# Patient Record
Sex: Female | Born: 1955 | Race: White | Hispanic: No | Marital: Married | Smoking: Never smoker
Health system: Southern US, Community
[De-identification: ages and names within clinical notes are randomized; demographics above are authoritative.]

## PROBLEM LIST (undated history)

## (undated) DIAGNOSIS — K5790 Diverticulosis of intestine, part unspecified, without perforation or abscess without bleeding: Secondary | ICD-10-CM

## (undated) DIAGNOSIS — Z9889 Other specified postprocedural states: Secondary | ICD-10-CM

## (undated) DIAGNOSIS — M810 Age-related osteoporosis without current pathological fracture: Secondary | ICD-10-CM

## (undated) DIAGNOSIS — M549 Dorsalgia, unspecified: Secondary | ICD-10-CM

## (undated) DIAGNOSIS — M2550 Pain in unspecified joint: Secondary | ICD-10-CM

## (undated) DIAGNOSIS — R112 Nausea with vomiting, unspecified: Secondary | ICD-10-CM

## (undated) DIAGNOSIS — G8929 Other chronic pain: Secondary | ICD-10-CM

## (undated) DIAGNOSIS — G4700 Insomnia, unspecified: Secondary | ICD-10-CM

## (undated) DIAGNOSIS — E785 Hyperlipidemia, unspecified: Secondary | ICD-10-CM

## (undated) DIAGNOSIS — T7840XA Allergy, unspecified, initial encounter: Secondary | ICD-10-CM

## (undated) DIAGNOSIS — F32A Depression, unspecified: Secondary | ICD-10-CM

## (undated) DIAGNOSIS — Z87898 Personal history of other specified conditions: Secondary | ICD-10-CM

## (undated) DIAGNOSIS — K8020 Calculus of gallbladder without cholecystitis without obstruction: Secondary | ICD-10-CM

## (undated) DIAGNOSIS — K529 Noninfective gastroenteritis and colitis, unspecified: Secondary | ICD-10-CM

## (undated) DIAGNOSIS — Z860100 Personal history of colon polyps, unspecified: Secondary | ICD-10-CM

## (undated) DIAGNOSIS — H269 Unspecified cataract: Secondary | ICD-10-CM

## (undated) DIAGNOSIS — J45909 Unspecified asthma, uncomplicated: Secondary | ICD-10-CM

## (undated) DIAGNOSIS — M1990 Unspecified osteoarthritis, unspecified site: Secondary | ICD-10-CM

## (undated) DIAGNOSIS — Z8709 Personal history of other diseases of the respiratory system: Secondary | ICD-10-CM

## (undated) DIAGNOSIS — E039 Hypothyroidism, unspecified: Secondary | ICD-10-CM

## (undated) DIAGNOSIS — I10 Essential (primary) hypertension: Secondary | ICD-10-CM

## (undated) DIAGNOSIS — F419 Anxiety disorder, unspecified: Secondary | ICD-10-CM

## (undated) DIAGNOSIS — Z9289 Personal history of other medical treatment: Secondary | ICD-10-CM

## (undated) DIAGNOSIS — K219 Gastro-esophageal reflux disease without esophagitis: Secondary | ICD-10-CM

## (undated) DIAGNOSIS — R0602 Shortness of breath: Secondary | ICD-10-CM

## (undated) DIAGNOSIS — J189 Pneumonia, unspecified organism: Secondary | ICD-10-CM

## (undated) DIAGNOSIS — R531 Weakness: Secondary | ICD-10-CM

## (undated) DIAGNOSIS — F329 Major depressive disorder, single episode, unspecified: Secondary | ICD-10-CM

## (undated) HISTORY — DX: Allergy, unspecified, initial encounter: T78.40XA

## (undated) HISTORY — DX: Unspecified cataract: H26.9

## (undated) HISTORY — PX: OTHER SURGICAL HISTORY: SHX169

## (undated) HISTORY — PX: EYE SURGERY: SHX253

## (undated) HISTORY — PX: POLYPECTOMY: SHX149

## (undated) HISTORY — DX: Age-related osteoporosis without current pathological fracture: M81.0

## (undated) HISTORY — PX: SHOULDER SURGERY: SHX246

## (undated) HISTORY — PX: COLOSTOMY: SHX63

## (undated) HISTORY — PX: CHOLECYSTECTOMY: SHX55

## (undated) HISTORY — PX: COLONOSCOPY: SHX174

---
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
## 1977-05-15 HISTORY — PX: DILATION AND CURETTAGE OF UTERUS: SHX78

## 1981-05-15 HISTORY — PX: OTHER SURGICAL HISTORY: SHX169

## 2008-05-15 HISTORY — PX: JOINT REPLACEMENT: SHX530

## 2013-02-05 ENCOUNTER — Other Ambulatory Visit: Payer: Self-pay | Admitting: Neurosurgery

## 2013-02-05 DIAGNOSIS — M549 Dorsalgia, unspecified: Secondary | ICD-10-CM

## 2013-02-13 ENCOUNTER — Ambulatory Visit
Admission: RE | Admit: 2013-02-13 | Discharge: 2013-02-13 | Disposition: A | Discharge status: Home / Self Care | Payer: Medicare Other | Source: Ambulatory Visit | Attending: Neurosurgery | Admitting: Neurosurgery

## 2013-02-13 VITALS — BP 106/57 | HR 75 | Ht 65.0 in | Wt 240.0 lb

## 2013-02-13 DIAGNOSIS — M549 Dorsalgia, unspecified: Secondary | ICD-10-CM

## 2013-02-13 MED ORDER — DIAZEPAM 5 MG PO TABS
10.0000 mg | ORAL_TABLET | Freq: Once | ORAL | Status: AC
  Administered 2013-02-13: 10 mg via ORAL

## 2013-02-13 MED ORDER — ONDANSETRON HCL 4 MG/2ML IJ SOLN: 4.0000 mg | Freq: Four times a day (QID) | INTRAMUSCULAR | Status: DC | PRN

## 2013-02-13 MED ORDER — IOHEXOL 180 MG/ML  SOLN
15.0000 mL | Freq: Once | INTRAMUSCULAR | Status: AC | PRN
  Administered 2013-02-13: 15 mL via INTRATHECAL

## 2013-02-13 NOTE — Progress Notes (Signed)
Pt states she has been off venlafaxine for the past 2 days. Discharge instructions explained to pt.

## 2013-02-17 ENCOUNTER — Other Ambulatory Visit: Payer: Self-pay | Admitting: Neurosurgery

## 2013-03-05 ENCOUNTER — Encounter (HOSPITAL_COMMUNITY): Payer: Self-pay | Admitting: Pharmacy Technician

## 2013-03-10 ENCOUNTER — Encounter (HOSPITAL_COMMUNITY): Payer: Self-pay

## 2013-03-10 ENCOUNTER — Encounter (HOSPITAL_COMMUNITY)
Admission: RE | Admit: 2013-03-10 | Discharge: 2013-03-10 | Disposition: A | Discharge status: Home / Self Care | Payer: Medicare Other | Source: Ambulatory Visit | Attending: Anesthesiology | Admitting: Anesthesiology

## 2013-03-10 ENCOUNTER — Encounter (HOSPITAL_COMMUNITY)
Admission: RE | Admit: 2013-03-10 | Discharge: 2013-03-10 | Disposition: A | Discharge status: Home / Self Care | Payer: Medicare Other | Source: Ambulatory Visit | Attending: Neurosurgery | Admitting: Neurosurgery

## 2013-03-10 DIAGNOSIS — Z01812 Encounter for preprocedural laboratory examination: Secondary | ICD-10-CM | POA: Insufficient documentation

## 2013-03-10 DIAGNOSIS — Z01810 Encounter for preprocedural cardiovascular examination: Secondary | ICD-10-CM | POA: Insufficient documentation

## 2013-03-10 DIAGNOSIS — Z01818 Encounter for other preprocedural examination: Secondary | ICD-10-CM | POA: Insufficient documentation

## 2013-03-10 HISTORY — DX: Major depressive disorder, single episode, unspecified: F32.9

## 2013-03-10 HISTORY — DX: Other specified postprocedural states: R11.2

## 2013-03-10 HISTORY — DX: Weakness: R53.1

## 2013-03-10 HISTORY — DX: Insomnia, unspecified: G47.00

## 2013-03-10 HISTORY — DX: Diverticulosis of intestine, part unspecified, without perforation or abscess without bleeding: K57.90

## 2013-03-10 HISTORY — DX: Gastro-esophageal reflux disease without esophagitis: K21.9

## 2013-03-10 HISTORY — DX: Personal history of other diseases of the respiratory system: Z87.09

## 2013-03-10 HISTORY — DX: Hypothyroidism, unspecified: E03.9

## 2013-03-10 HISTORY — DX: Anxiety disorder, unspecified: F41.9

## 2013-03-10 HISTORY — DX: Essential (primary) hypertension: I10

## 2013-03-10 HISTORY — DX: Personal history of colon polyps, unspecified: Z86.0100

## 2013-03-10 HISTORY — DX: Personal history of colonic polyps: Z86.010

## 2013-03-10 HISTORY — DX: Pneumonia, unspecified organism: J18.9

## 2013-03-10 HISTORY — DX: Calculus of gallbladder without cholecystitis without obstruction: K80.20

## 2013-03-10 HISTORY — DX: Other chronic pain: G89.29

## 2013-03-10 HISTORY — DX: Shortness of breath: R06.02

## 2013-03-10 HISTORY — DX: Unspecified osteoarthritis, unspecified site: M19.90

## 2013-03-10 HISTORY — DX: Personal history of other specified conditions: Z87.898

## 2013-03-10 HISTORY — DX: Depression, unspecified: F32.A

## 2013-03-10 HISTORY — DX: Pain in unspecified joint: M25.50

## 2013-03-10 HISTORY — DX: Hyperlipidemia, unspecified: E78.5

## 2013-03-10 HISTORY — DX: Personal history of other medical treatment: Z92.89

## 2013-03-10 HISTORY — DX: Dorsalgia, unspecified: M54.9

## 2013-03-10 HISTORY — DX: Other specified postprocedural states: Z98.890

## 2013-03-10 HISTORY — DX: Unspecified asthma, uncomplicated: J45.909

## 2013-03-10 LAB — BASIC METABOLIC PANEL
BUN: 17 mg/dL (ref 6–23)
CO2: 31 mEq/L (ref 19–32)
Calcium: 9.5 mg/dL (ref 8.4–10.5)
Chloride: 97 mEq/L (ref 96–112)
Glucose, Bld: 93 mg/dL (ref 70–99)
Potassium: 3.5 mEq/L (ref 3.5–5.1)
Sodium: 136 mEq/L (ref 135–145)

## 2013-03-10 LAB — CBC
Hemoglobin: 15.1 g/dL — ABNORMAL HIGH (ref 12.0–15.0)
MCH: 31.5 pg (ref 26.0–34.0)
MCV: 90.8 fL (ref 78.0–100.0)
RBC: 4.8 MIL/uL (ref 3.87–5.11)

## 2013-03-10 NOTE — Pre-Procedure Instructions (Signed)
Whitney Higgins  03/10/2013   Your procedure is scheduled on:  Tues, Nov 4 @ 10:00 AM  Report to [facility] Stay [facility]trance A at 8:00 AM.  Call this number if you have problems the morning of surgery: [phone]   Remember:   Do not eat food or drink liquids after midnight.   Take these medicines the morning of surgery with A SIP OF WATER: Albuterol<Bring Your Inhaler With You>,Alprazolam(Xanax),Amlodipine(Norvasc),Zyrtec(Cetirizine),Synthroid(Levothyroxine),Singulair(Montelukast),Omeprazole(Prilosec),Cytomel(Liothyronine), and Effexor(Venlafaxine)             No Goody's,BC's,Aspirin,Ibuprofen,Fish Oil,or any Herbal Medications   Do not wear jewelry, make-up or nail polish.  Do not wear lotions, powders, or perfumes. You may wear deodorant.  Do not shave 48 hours prior to surgery.   Do not bring valuables to the hospital.  [facility] is not responsible                  for any belongings or valuables.               Contacts, dentures or bridgework may not be worn into surgery.  Leave suitcase in the car. After surgery it may be brought to your room.  For patients admitted to the hospital, discharge time is determined by your                treatment team.               Patients discharged the day of surgery will not be allowed to drive  home.    Special Instructions: Shower using CHG 2 nights before surgery and the night before surgery.  If you shower the day of surgery use CHG.  Use special wash - you have one bottle of CHG for all showers.  You should use approximately 1/3 of the bottle for each shower.   Please read over the following fact sheets that you were given: Pain Booklet, Coughing and Deep Breathing, MRSA Information and Surgical Site Infection Prevention

## 2013-03-10 NOTE — Progress Notes (Addendum)
Pt doesn't have a cardiologist  Stress test done >105yrs ago  Denies ever having an echo or heart cath  Medical Md is with [facility] in [place] Peyton Najjar  Denies having an ekg or cxr in past yr

## 2013-03-11 NOTE — Progress Notes (Signed)
Anesthesia Chart Review:  Patient is a 57 year old female scheduled for left L4-5, L5-S1 microdiskectomy on 03/18/13 by Dr. Gerlene Fee.  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 PCP is Daphane Shepherd, PA-C with [facility]. She is aware of plans for back surgery.  EKG on 03/10/13 showed NSR, minimal voltage criteria for LVH, inferior infarct (age undetermined).  There are no prior EKG tracing within [facility] or at her PCP office.  She reported a stress test > 5 years ago, but denied prior echo or cardiac cath. Prior testing was done in [facility].    I called and spoke with patient.  She reported prior stress test (treadmill) was done more for SOB to rule out cardiac etiology.  She has allergy and exercise induced asthma and was treated for bronchitis earlier this month.  She feels back at her baseline.  She denies SOB at rest or significant DOE with her day to day activities.  She denies chest pain and edema. Her activity has been fairly limited since early summer due to back pain.  She received an epidural injection about one month ago and has been able to do her own shopping again and was able to take her grandson to the park recently.  She has had no CV symptoms with these activities.    CXR on 03/10/13 showed no acute cardiopulmonary disease.  Preoperative labs noted.  I reviewed above including EKG with anesthesiologist Dr. Jacklynn Bue yesterday.  I was able to confirm with patient that she is not having any CV symptoms.  If no acute changes then it is anticipated that she can proceed as planned.  Velna Ochs [facility] Short Stay  Center/Anesthesiology Phone [phone] 03/11/2013 5:03 PM

## 2013-03-17 MED ORDER — VANCOMYCIN HCL 10 G IV SOLR
1500.0000 mg | INTRAVENOUS | Status: AC
  Administered 2013-03-18: 1500 mg via INTRAVENOUS
  Filled 2013-03-17: qty 1500

## 2013-03-18 ENCOUNTER — Encounter (HOSPITAL_COMMUNITY): Payer: Medicare Other | Admitting: Vascular Surgery

## 2013-03-18 ENCOUNTER — Inpatient Hospital Stay (HOSPITAL_COMMUNITY): Payer: Medicare Other

## 2013-03-18 ENCOUNTER — Inpatient Hospital Stay (HOSPITAL_COMMUNITY)
Admission: RE | Admit: 2013-03-18 | Discharge: 2013-03-19 | DRG: 520 | Disposition: A | Discharge status: Home / Self Care | Payer: Medicare Other | Source: Ambulatory Visit | Attending: Neurosurgery | Admitting: Neurosurgery

## 2013-03-18 ENCOUNTER — Encounter (HOSPITAL_COMMUNITY)
Admission: RE | Disposition: A | Discharge status: Home / Self Care | Payer: Self-pay | Source: Ambulatory Visit | Attending: Neurosurgery

## 2013-03-18 ENCOUNTER — Inpatient Hospital Stay (HOSPITAL_COMMUNITY): Payer: Medicare Other | Admitting: Certified Registered Nurse Anesthetist

## 2013-03-18 ENCOUNTER — Encounter (HOSPITAL_COMMUNITY): Payer: Self-pay | Admitting: *Deleted

## 2013-03-18 DIAGNOSIS — M5126 Other intervertebral disc displacement, lumbar region: Principal | ICD-10-CM | POA: Diagnosis present

## 2013-03-18 DIAGNOSIS — G8929 Other chronic pain: Secondary | ICD-10-CM | POA: Diagnosis present

## 2013-03-18 DIAGNOSIS — G4700 Insomnia, unspecified: Secondary | ICD-10-CM | POA: Diagnosis present

## 2013-03-18 DIAGNOSIS — Z860100 Personal history of colon polyps, unspecified: Secondary | ICD-10-CM

## 2013-03-18 DIAGNOSIS — F3289 Other specified depressive episodes: Secondary | ICD-10-CM | POA: Diagnosis present

## 2013-03-18 DIAGNOSIS — E785 Hyperlipidemia, unspecified: Secondary | ICD-10-CM | POA: Diagnosis present

## 2013-03-18 DIAGNOSIS — F329 Major depressive disorder, single episode, unspecified: Secondary | ICD-10-CM | POA: Diagnosis present

## 2013-03-18 DIAGNOSIS — Z79899 Other long term (current) drug therapy: Secondary | ICD-10-CM

## 2013-03-18 DIAGNOSIS — J45909 Unspecified asthma, uncomplicated: Secondary | ICD-10-CM | POA: Diagnosis present

## 2013-03-18 DIAGNOSIS — I10 Essential (primary) hypertension: Secondary | ICD-10-CM | POA: Diagnosis present

## 2013-03-18 DIAGNOSIS — F411 Generalized anxiety disorder: Secondary | ICD-10-CM | POA: Diagnosis present

## 2013-03-18 DIAGNOSIS — R5381 Other malaise: Secondary | ICD-10-CM | POA: Diagnosis present

## 2013-03-18 DIAGNOSIS — K219 Gastro-esophageal reflux disease without esophagitis: Secondary | ICD-10-CM | POA: Diagnosis present

## 2013-03-18 DIAGNOSIS — Z880 Allergy status to penicillin: Secondary | ICD-10-CM

## 2013-03-18 DIAGNOSIS — E039 Hypothyroidism, unspecified: Secondary | ICD-10-CM | POA: Diagnosis present

## 2013-03-18 DIAGNOSIS — Z9089 Acquired absence of other organs: Secondary | ICD-10-CM

## 2013-03-18 DIAGNOSIS — Z23 Encounter for immunization: Secondary | ICD-10-CM

## 2013-03-18 HISTORY — PX: LUMBAR LAMINECTOMY/DECOMPRESSION MICRODISCECTOMY: SHX5026

## 2013-03-18 SURGERY — LUMBAR LAMINECTOMY/DECOMPRESSION MICRODISCECTOMY 2 LEVELS
Anesthesia: General | Site: Spine Lumbar | Laterality: Left | Wound class: Clean

## 2013-03-18 MED ORDER — SODIUM CHLORIDE 0.9 % IJ SOLN: 3.0000 mL | INTRAMUSCULAR | Status: DC | PRN

## 2013-03-18 MED ORDER — LACTATED RINGERS IV SOLN
INTRAVENOUS | Status: DC
  Administered 2013-03-18: 09:00:00 via INTRAVENOUS

## 2013-03-18 MED ORDER — HYDROCODONE-ACETAMINOPHEN 5-325 MG PO TABS
1.0000 | ORAL_TABLET | ORAL | Status: DC | PRN
  Filled 2013-03-18: qty 2

## 2013-03-18 MED ORDER — PROPOFOL 10 MG/ML IV BOLUS
INTRAVENOUS | Status: DC | PRN
  Administered 2013-03-18: 200 mg via INTRAVENOUS

## 2013-03-18 MED ORDER — ONDANSETRON HCL 4 MG/2ML IJ SOLN: 4.0000 mg | INTRAMUSCULAR | Status: DC | PRN

## 2013-03-18 MED ORDER — ONDANSETRON HCL 4 MG/2ML IJ SOLN
INTRAMUSCULAR | Status: DC | PRN
  Administered 2013-03-18: 4 mg via INTRAVENOUS

## 2013-03-18 MED ORDER — THROMBIN 5000 UNITS EX SOLR
CUTANEOUS | Status: DC | PRN
  Administered 2013-03-18 (×2): 5000 [IU] via TOPICAL

## 2013-03-18 MED ORDER — HYDROMORPHONE HCL PF 1 MG/ML IJ SOLN
1.0000 mg | INTRAMUSCULAR | Status: DC | PRN
  Administered 2013-03-18 – 2013-03-19 (×3): 1 mg via INTRAMUSCULAR
  Filled 2013-03-18 (×3): qty 1

## 2013-03-18 MED ORDER — INFLUENZA VAC SPLIT QUAD 0.5 ML IM SUSP
0.5000 mL | INTRAMUSCULAR | Status: AC
  Administered 2013-03-19: 0.5 mL via INTRAMUSCULAR
  Filled 2013-03-18: qty 0.5

## 2013-03-18 MED ORDER — LISINOPRIL 20 MG PO TABS
20.0000 mg | ORAL_TABLET | Freq: Every day | ORAL | Status: DC
  Filled 2013-03-18: qty 1

## 2013-03-18 MED ORDER — DEXAMETHASONE SODIUM PHOSPHATE 10 MG/ML IJ SOLN
10.0000 mg | INTRAMUSCULAR | Status: AC
  Administered 2013-03-18: 10 mg via INTRAVENOUS

## 2013-03-18 MED ORDER — SODIUM CHLORIDE 0.9 % IV SOLN
INTRAVENOUS | Status: DC | PRN
  Administered 2013-03-18: 11:00:00 via INTRAVENOUS

## 2013-03-18 MED ORDER — ACETAMINOPHEN 650 MG RE SUPP: 650.0000 mg | RECTAL | Status: DC | PRN

## 2013-03-18 MED ORDER — LISINOPRIL-HYDROCHLOROTHIAZIDE 20-25 MG PO TABS: 1.0000 | ORAL_TABLET | Freq: Every day | ORAL | Status: DC

## 2013-03-18 MED ORDER — MIDAZOLAM HCL 5 MG/5ML IJ SOLN
INTRAMUSCULAR | Status: DC | PRN
  Administered 2013-03-18 (×2): 2 mg via INTRAVENOUS

## 2013-03-18 MED ORDER — BUPIVACAINE HCL (PF) 0.5 % IJ SOLN
INTRAMUSCULAR | Status: DC | PRN
  Administered 2013-03-18: 20 mL

## 2013-03-18 MED ORDER — DEXAMETHASONE SODIUM PHOSPHATE 4 MG/ML IJ SOLN: 4.0000 mg | Freq: Four times a day (QID) | INTRAMUSCULAR | Status: AC

## 2013-03-18 MED ORDER — VENLAFAXINE HCL ER 150 MG PO CP24
150.0000 mg | ORAL_CAPSULE | Freq: Two times a day (BID) | ORAL | Status: DC
  Administered 2013-03-18: 150 mg via ORAL
  Filled 2013-03-18 (×3): qty 1

## 2013-03-18 MED ORDER — ALPRAZOLAM 0.25 MG PO TABS
0.2500 mg | ORAL_TABLET | Freq: Every evening | ORAL | Status: DC | PRN
  Administered 2013-03-18: 0.25 mg via ORAL
  Filled 2013-03-18: qty 1

## 2013-03-18 MED ORDER — SIMVASTATIN 5 MG PO TABS
5.0000 mg | ORAL_TABLET | Freq: Every day | ORAL | Status: DC
  Administered 2013-03-18: 5 mg via ORAL
  Filled 2013-03-18 (×2): qty 1

## 2013-03-18 MED ORDER — OXYCODONE HCL 5 MG PO TABS: 5.0000 mg | ORAL_TABLET | Freq: Once | ORAL | Status: DC | PRN

## 2013-03-18 MED ORDER — HEMOSTATIC AGENTS (NO CHARGE) OPTIME
TOPICAL | Status: DC | PRN
  Administered 2013-03-18: 1 via TOPICAL

## 2013-03-18 MED ORDER — ONDANSETRON HCL 4 MG/2ML IJ SOLN
4.0000 mg | Freq: Once | INTRAMUSCULAR | Status: AC | PRN
  Administered 2013-03-18: 4 mg via INTRAVENOUS

## 2013-03-18 MED ORDER — SODIUM CHLORIDE 0.9 % IR SOLN
Status: DC | PRN
  Administered 2013-03-18: 11:00:00

## 2013-03-18 MED ORDER — FENTANYL CITRATE 0.05 MG/ML IJ SOLN
INTRAMUSCULAR | Status: DC | PRN
  Administered 2013-03-18: 150 ug via INTRAVENOUS

## 2013-03-18 MED ORDER — LIDOCAINE HCL (CARDIAC) 20 MG/ML IV SOLN
INTRAVENOUS | Status: DC | PRN
  Administered 2013-03-18: 80 mg via INTRAVENOUS

## 2013-03-18 MED ORDER — PANTOPRAZOLE SODIUM 40 MG IV SOLR
40.0000 mg | Freq: Every day | INTRAVENOUS | Status: DC
  Administered 2013-03-18: 40 mg via INTRAVENOUS
  Filled 2013-03-18 (×2): qty 40

## 2013-03-18 MED ORDER — SODIUM CHLORIDE 0.9 % IJ SOLN
3.0000 mL | Freq: Two times a day (BID) | INTRAMUSCULAR | Status: DC
  Administered 2013-03-18: 3 mL via INTRAVENOUS

## 2013-03-18 MED ORDER — SCOPOLAMINE 1 MG/3DAYS TD PT72
MEDICATED_PATCH | TRANSDERMAL | Status: AC
  Filled 2013-03-18: qty 1

## 2013-03-18 MED ORDER — ZOLPIDEM TARTRATE 5 MG PO TABS
5.0000 mg | ORAL_TABLET | Freq: Every evening | ORAL | Status: DC | PRN
Start: 2013-03-18 — End: 2013-03-19
  Filled 2013-03-18: qty 1

## 2013-03-18 MED ORDER — PHENYLEPHRINE HCL 10 MG/ML IJ SOLN
INTRAMUSCULAR | Status: DC | PRN
  Administered 2013-03-18 (×2): 80 ug via INTRAVENOUS

## 2013-03-18 MED ORDER — DEXAMETHASONE SODIUM PHOSPHATE 10 MG/ML IJ SOLN
INTRAMUSCULAR | Status: AC
  Filled 2013-03-18: qty 1

## 2013-03-18 MED ORDER — KETOROLAC TROMETHAMINE 30 MG/ML IJ SOLN
30.0000 mg | Freq: Four times a day (QID) | INTRAMUSCULAR | Status: DC
  Administered 2013-03-18 – 2013-03-19 (×3): 30 mg via INTRAVENOUS
  Filled 2013-03-18 (×7): qty 1

## 2013-03-18 MED ORDER — DEXAMETHASONE 4 MG PO TABS
4.0000 mg | ORAL_TABLET | Freq: Four times a day (QID) | ORAL | Status: AC
  Administered 2013-03-18 (×2): 4 mg via ORAL
  Filled 2013-03-18 (×2): qty 1

## 2013-03-18 MED ORDER — LACTATED RINGERS IV SOLN
INTRAVENOUS | Status: DC | PRN
  Administered 2013-03-18 (×2): via INTRAVENOUS

## 2013-03-18 MED ORDER — LEVOTHYROXINE SODIUM 88 MCG PO TABS
44.0000 ug | ORAL_TABLET | Freq: Every day | ORAL | Status: DC
  Administered 2013-03-19: 44 ug via ORAL
  Filled 2013-03-18 (×3): qty 0.5

## 2013-03-18 MED ORDER — ACETAMINOPHEN 325 MG PO TABS: 650.0000 mg | ORAL_TABLET | ORAL | Status: DC | PRN

## 2013-03-18 MED ORDER — NEOSTIGMINE METHYLSULFATE 1 MG/ML IJ SOLN
INTRAMUSCULAR | Status: DC | PRN
  Administered 2013-03-18: 4 mg via INTRAVENOUS

## 2013-03-18 MED ORDER — ONDANSETRON HCL 4 MG/2ML IJ SOLN
INTRAMUSCULAR | Status: AC
  Filled 2013-03-18: qty 2

## 2013-03-18 MED ORDER — SCOPOLAMINE 1 MG/3DAYS TD PT72
1.0000 | MEDICATED_PATCH | TRANSDERMAL | Status: DC
  Filled 2013-03-18: qty 1

## 2013-03-18 MED ORDER — OXYCODONE HCL 5 MG/5ML PO SOLN: 5.0000 mg | Freq: Once | ORAL | Status: DC | PRN

## 2013-03-18 MED ORDER — CYCLOBENZAPRINE HCL 10 MG PO TABS: 10.0000 mg | ORAL_TABLET | Freq: Three times a day (TID) | ORAL | Status: DC | PRN

## 2013-03-18 MED ORDER — GLYCOPYRROLATE 0.2 MG/ML IJ SOLN
INTRAMUSCULAR | Status: DC | PRN
  Administered 2013-03-18: .8 mg via INTRAVENOUS

## 2013-03-18 MED ORDER — MONTELUKAST SODIUM 10 MG PO TABS
10.0000 mg | ORAL_TABLET | Freq: Every day | ORAL | Status: DC
  Administered 2013-03-18: 10 mg via ORAL
  Filled 2013-03-18 (×2): qty 1

## 2013-03-18 MED ORDER — VANCOMYCIN HCL IN DEXTROSE 1-5 GM/200ML-% IV SOLN
1000.0000 mg | Freq: Once | INTRAVENOUS | Status: AC
  Administered 2013-03-18: 1000 mg via INTRAVENOUS
  Filled 2013-03-18: qty 200

## 2013-03-18 MED ORDER — ROCURONIUM BROMIDE 100 MG/10ML IV SOLN
INTRAVENOUS | Status: DC | PRN
  Administered 2013-03-18: 50 mg via INTRAVENOUS
  Administered 2013-03-18 (×2): 10 mg via INTRAVENOUS

## 2013-03-18 MED ORDER — SODIUM CHLORIDE 0.9 % IV SOLN: 250.0000 mL | INTRAVENOUS | Status: DC

## 2013-03-18 MED ORDER — PHENOL 1.4 % MT LIQD: 1.0000 | OROMUCOSAL | Status: DC | PRN

## 2013-03-18 MED ORDER — HYDROMORPHONE HCL PF 1 MG/ML IJ SOLN
INTRAMUSCULAR | Status: AC
  Filled 2013-03-18: qty 1

## 2013-03-18 MED ORDER — KCL IN DEXTROSE-NACL 20-5-0.45 MEQ/L-%-% IV SOLN
80.0000 mL/h | INTRAVENOUS | Status: DC
  Filled 2013-03-18 (×3): qty 1000

## 2013-03-18 MED ORDER — LIOTHYRONINE SODIUM 25 MCG PO TABS
25.0000 ug | ORAL_TABLET | Freq: Every day | ORAL | Status: DC
  Administered 2013-03-19: 25 ug via ORAL
  Filled 2013-03-18 (×3): qty 1

## 2013-03-18 MED ORDER — MENTHOL 3 MG MT LOZG: 1.0000 | LOZENGE | OROMUCOSAL | Status: DC | PRN

## 2013-03-18 MED ORDER — 0.9 % SODIUM CHLORIDE (POUR BTL) OPTIME
TOPICAL | Status: DC | PRN
  Administered 2013-03-18: 1000 mL

## 2013-03-18 MED ORDER — AMLODIPINE BESYLATE 5 MG PO TABS
5.0000 mg | ORAL_TABLET | Freq: Every day | ORAL | Status: DC
  Filled 2013-03-18: qty 1

## 2013-03-18 MED ORDER — ALBUTEROL SULFATE HFA 108 (90 BASE) MCG/ACT IN AERS
2.0000 | INHALATION_SPRAY | Freq: Four times a day (QID) | RESPIRATORY_TRACT | Status: DC | PRN
  Filled 2013-03-18: qty 6.7

## 2013-03-18 MED ORDER — EPHEDRINE SULFATE 50 MG/ML IJ SOLN
INTRAMUSCULAR | Status: DC | PRN
  Administered 2013-03-18: 5 mg via INTRAVENOUS
  Administered 2013-03-18 (×3): 15 mg via INTRAVENOUS

## 2013-03-18 MED ORDER — HYDROCHLOROTHIAZIDE 25 MG PO TABS
25.0000 mg | ORAL_TABLET | Freq: Every day | ORAL | Status: DC
  Filled 2013-03-18: qty 1

## 2013-03-18 MED ORDER — SODIUM CHLORIDE 0.9 % IV SOLN
10.0000 mg | INTRAVENOUS | Status: DC | PRN
  Administered 2013-03-18: 40 ug/min via INTRAVENOUS

## 2013-03-18 MED ORDER — HYDROMORPHONE HCL PF 1 MG/ML IJ SOLN
0.2500 mg | INTRAMUSCULAR | Status: DC | PRN
  Administered 2013-03-18: 0.5 mg via INTRAVENOUS

## 2013-03-18 MED ORDER — SCOPOLAMINE 1 MG/3DAYS TD PT72
MEDICATED_PATCH | TRANSDERMAL | Status: DC | PRN
  Administered 2013-03-18: 1 via TRANSDERMAL

## 2013-03-18 SURGICAL SUPPLY — 52 items
BAG DECANTER FOR FLEXI CONT (MISCELLANEOUS) ×2 IMPLANT
BENZOIN TINCTURE PRP APPL 2/3 (GAUZE/BANDAGES/DRESSINGS) ×4 IMPLANT
BLADE SURG ROTATE 9660 (MISCELLANEOUS) ×2 IMPLANT
BRUSH SCRUB EZ PLAIN DRY (MISCELLANEOUS) ×2 IMPLANT
BUR CUTTER 7.0 ROUND (BURR) ×2 IMPLANT
BUR MATCHSTICK NEURO 3.0 LAGG (BURR) ×2 IMPLANT
CANISTER SUCT 3000ML (MISCELLANEOUS) ×2 IMPLANT
CONT SPEC 4OZ CLIKSEAL STRL BL (MISCELLANEOUS) ×2 IMPLANT
DERMABOND ADHESIVE PROPEN (GAUZE/BANDAGES/DRESSINGS) ×1
DERMABOND ADVANCED (GAUZE/BANDAGES/DRESSINGS)
DERMABOND ADVANCED .7 DNX12 (GAUZE/BANDAGES/DRESSINGS) IMPLANT
DERMABOND ADVANCED .7 DNX6 (GAUZE/BANDAGES/DRESSINGS) ×1 IMPLANT
DRAPE LAPAROTOMY 100X72X124 (DRAPES) ×2 IMPLANT
DRAPE MICROSCOPE ZEISS OPMI (DRAPES) ×2 IMPLANT
DRAPE SURG 17X23 STRL (DRAPES) ×4 IMPLANT
DRESSING TELFA 8X3 (GAUZE/BANDAGES/DRESSINGS) IMPLANT
DRSG OPSITE POSTOP 4X6 (GAUZE/BANDAGES/DRESSINGS) ×2 IMPLANT
ELECT BLADE 4.0 EZ CLEAN MEGAD (MISCELLANEOUS) ×2
ELECT REM PT RETURN 9FT ADLT (ELECTROSURGICAL) ×2
ELECTRODE BLDE 4.0 EZ CLN MEGD (MISCELLANEOUS) ×1 IMPLANT
ELECTRODE REM PT RTRN 9FT ADLT (ELECTROSURGICAL) ×1 IMPLANT
GAUZE SPONGE 4X4 16PLY XRAY LF (GAUZE/BANDAGES/DRESSINGS) ×2 IMPLANT
GLOVE BIOGEL PI IND STRL 7.0 (GLOVE) ×4 IMPLANT
GLOVE BIOGEL PI IND STRL 7.5 (GLOVE) ×2 IMPLANT
GLOVE BIOGEL PI INDICATOR 7.0 (GLOVE) ×4
GLOVE BIOGEL PI INDICATOR 7.5 (GLOVE) ×2
GLOVE ECLIPSE 7.0 STRL STRAW (GLOVE) ×2 IMPLANT
GLOVE ECLIPSE 8.0 STRL XLNG CF (GLOVE) ×2 IMPLANT
GLOVE OPTIFIT SS 6.5 STRL BRWN (GLOVE) ×8 IMPLANT
GLOVE SS BIOGEL STRL SZ 7 (GLOVE) ×2 IMPLANT
GLOVE SUPERSENSE BIOGEL SZ 7 (GLOVE) ×2
GOWN BRE IMP SLV AUR LG STRL (GOWN DISPOSABLE) ×4 IMPLANT
GOWN BRE IMP SLV AUR XL STRL (GOWN DISPOSABLE) ×4 IMPLANT
GOWN STRL REIN 2XL LVL4 (GOWN DISPOSABLE) IMPLANT
KIT BASIN OR (CUSTOM PROCEDURE TRAY) ×2 IMPLANT
KIT ROOM TURNOVER OR (KITS) ×2 IMPLANT
NEEDLE HYPO 22GX1.5 SAFETY (NEEDLE) ×2 IMPLANT
NEEDLE SPNL 22GX3.5 QUINCKE BK (NEEDLE) ×4 IMPLANT
NS IRRIG 1000ML POUR BTL (IV SOLUTION) ×2 IMPLANT
PACK LAMINECTOMY NEURO (CUSTOM PROCEDURE TRAY) ×2 IMPLANT
PAD ARMBOARD 7.5X6 YLW CONV (MISCELLANEOUS) ×10 IMPLANT
PATTIES SURGICAL .75X.75 (GAUZE/BANDAGES/DRESSINGS) ×2 IMPLANT
RUBBERBAND STERILE (MISCELLANEOUS) ×4 IMPLANT
SPONGE GAUZE 4X4 12PLY (GAUZE/BANDAGES/DRESSINGS) IMPLANT
SPONGE SURGIFOAM ABS GEL SZ50 (HEMOSTASIS) ×2 IMPLANT
STRIP CLOSURE SKIN 1/2X4 (GAUZE/BANDAGES/DRESSINGS) ×2 IMPLANT
SUT VIC AB 2-0 OS6 18 (SUTURE) ×8 IMPLANT
SUT VIC AB 3-0 CP2 18 (SUTURE) ×2 IMPLANT
SYR 20ML ECCENTRIC (SYRINGE) ×2 IMPLANT
TOWEL OR 17X24 6PK STRL BLUE (TOWEL DISPOSABLE) ×2 IMPLANT
TOWEL OR 17X26 10 PK STRL BLUE (TOWEL DISPOSABLE) ×2 IMPLANT
WATER STERILE IRR 1000ML POUR (IV SOLUTION) ×2 IMPLANT

## 2013-03-18 NOTE — H&P (Signed)
Whitney Higgins is an 57 y.o. female.   Chief Complaint: Left leg pain HPI: The patient is a 57 year old female who was evaluated in the office for left leg pain into the foot a 4 months duration. There is no inciting event. She saw orthopedist shortly after onset was tried on injections and herniated hip which gave her no relief. She doesn't oral prednisone to give her no help. She doesn't Neurontin which gave a mildly Foley but nothing sustained. MR scan was done. Gentle epidural shots which gave her no sustained relief. She is markedly debilitated by her problem after coming to the office for evaluation the options were discussed. General myelography which showed significant abnormalities L4-5 and L5-S1 on the left, symptomatic side. After discussing the options she requested surgery now comes for a two-level left microdiscectomy. I had a long discussion with her regarding the risks and benefits of surgical intervention. The risks discussed include but are not limited to bleeding infection weakness numbness paralysis spinal fluid leak coma and death. We have discussed alternative therapy although risks and benefits of nonintervention. She's had the opportunity to rest numerous questions and appears to understand. With this information in hand she has requested we proceed with surgery.  Past Medical History  Diagnosis Date  . Anxiety     takes Xanax daily  . Depression     takes Effexor daily  . PONV (postoperative nausea and vomiting)   . Hypertension     takes Amlodipine and Lisinopril daily  . Hyperlipidemia     takes Pravastatin daily  . Asthma     inhaler prn and Singulair daily;OTC Zyrtec daily  . Pneumonia     hx of;last time about 4yrs ago  . History of bronchitis     last time less than a yr ago  . Shortness of breath     with exertion  . H/O: pituitary tumor   . Weakness     left leg  . Arthritis   . Joint pain   . Chronic back pain     stenosis  . GERD (gastroesophageal  reflux disease)     takes Omeprazole daily  . History of colon polyps   . Diverticulosis   . History of blood transfusion     no abnormal reaction noted  . Hypothyroidism     takes synthroid and cytomel daily  . Insomnia     takes Xanax nightly  . Gallstones     Past Surgical History  Procedure Laterality Date  . Partial removal of right kidney  1971  . Epidural injections    . Appendectomy  1974  . Dilation and curettage of uterus  1979  . Cesarean section  1981  . Pituitary tumor removed  1983  . Joint replacement  2010    right hip  . Colonoscopy    . Eye surgery      at age 4;vision correction    History reviewed. No pertinent family history. Social History:  reports that she has never smoked. She has never used smokeless tobacco. She reports that she drinks alcohol. She reports that she does not use illicit drugs.  Allergies:  Allergies  Allergen Reactions  . Codeine Nausea And Vomiting  . Penicillins Rash    Medications Prior to Admission  Medication Sig Dispense Refill  . acetaminophen (TYLENOL) 500 MG tablet Take 1,000 mg by mouth every 6 (six) hours as needed for pain.      Marland Kitchen albuterol (PROVENTIL HFA;VENTOLIN HFA) 108 (  90 BASE) MCG/ACT inhaler Inhale 2 puffs into the lungs every 6 (six) hours as needed for wheezing.      Marland Kitchen ALPRAZolam (XANAX) 0.5 MG tablet Take 0.25 mg by mouth at bedtime as needed for sleep.      Marland Kitchen amLODipine (NORVASC) 5 MG tablet Take 5 mg by mouth daily.      . cetirizine (ZYRTEC) 10 MG tablet Take 10 mg by mouth daily.      Marland Kitchen levothyroxine (SYNTHROID, LEVOTHROID) 88 MCG tablet Take 44 mcg by mouth daily before breakfast.      . liothyronine (CYTOMEL) 25 MCG tablet Take 25 mcg by mouth daily.      Marland Kitchen lisinopril-hydrochlorothiazide (PRINZIDE,ZESTORETIC) 20-25 MG per tablet Take 1 tablet by mouth daily.      . montelukast (SINGULAIR) 10 MG tablet Take 10 mg by mouth daily.      Marland Kitchen omeprazole (PRILOSEC) 20 MG capsule Take 20 mg by mouth daily  as needed (for reflux).      . pravastatin (PRAVACHOL) 40 MG tablet Take 40 mg by mouth daily.      Marland Kitchen venlafaxine XR (EFFEXOR-XR) 150 MG 24 hr capsule Take 150 mg by mouth 2 (two) times daily.        No results found for this or any previous visit (from the past 48 hour(s)). No results found.  Positive for back and leg pain as well as some asthma and occasional shortness of breath.  Blood pressure 139/74, pulse 84, temperature 98.6 F (37 C), temperature source Oral, resp. rate 18, SpO2 95.00%.  The patient is awake alert and oriented. She has a decreased reflexes bilaterally. Her strength is 5 over 5 and sensation is intact Assessment/Plan Impression is that of disc abnormalities at L4-5 and L5-S1 the left with lumbar radiculopathy. The plan is for a two-level Left sided microdiscectomy.  Reinaldo Meeker, MD 03/18/2013, 10:40 AM

## 2013-03-18 NOTE — Op Note (Signed)
Preop diagnosis: Herniated disc L4-5 L5-S1 left Postop diagnosis: Same Procedure: Left L4-5 L5-S1 intralaminar laminotomy for excision of herniated disc with the operative microscope Surgeon: Tima Curet Assistant: Conchita Paris  After being placed the prone position the patient's back was prepped and draped in usual sterile fashion. Localizing x-ray was taken prior to incision to identify the appropriate levels. Midline incision was made above the spinous processes of L4-L5 and S1. Using Bovie cutting current the incision was carried the spinous processes. Subperiosteal dissection was then carried out on the left side the spinous processes and lamina and self-retaining tract was placed for exposure. X-ray showed approach the appropriate levels. Starting L4-5 generous laminotomy was performed removing the inferior one half of the L4 lamina the medial third of the facet joint the superior one third of the L5 lamina. Residual bone and ligamentum flavum removed in a piecemeal fashion. Summary compression was then carried out at L5-S1. Once again the inferior one half of the L5 lamina the medial third of the facet joint the superior one third of the S1 segment removed. Residual bone and ligamentum flavum removed in a piecemeal fashion. The microscope was draped brought in the field and used for the remainder of the case. Starting L5-S1 lesion microdissection technique to identify the lateral aspect the thecal sac and S1 nerve root. This was done be herniated beneath it also calcified annulus. We a small entry point used Kerrison punch to remove the calcium shell and then cleaned the disc as best possible until the nerve root was well decompressed. We then turned our attention to L4-5 once again we entered the disc space and cleaned out thoroughly the L5 and S1 nerve roots were now found to be thoroughly decompressed and no other issues were felt to be present. The was then irrigated copiously metabolic irrigation and any  bleeding control proper coagulation Gelfoam. The was then closed in multiple layers of Vicryl on the muscle fascia subcutaneous and subcuticular tissues. Dermabond Steri-Strips were placed on the skin. Shortness was applied the patient was extubated and taken to recovery in stable condition.

## 2013-03-18 NOTE — Progress Notes (Signed)
ANTIBIOTIC CONSULT NOTE - INITIAL  Pharmacy Consult for vancomycin Indication: surgical prophylaxis s/p left lumbar microdisectomy  Allergies  Allergen Reactions  . Codeine Nausea And Vomiting  . Penicillins Rash    Patient Measurements:   Adjusted Body Weight:   Vital Signs: Temp: 98.5 F (36.9 C) (11/04 1444) Temp src: Oral (11/04 0802) BP: 101/64 mmHg (11/04 1444) Pulse Rate: 85 (11/04 1444) Intake/Output from previous day:   Intake/Output from this shift: Total I/O In: 2200 [I.V.:2200] Out: 100 [Blood:100]  Labs: No results found for this basename: WBC, HGB, PLT, LABCREA, CREATININE,  in the last 72 hours The CrCl is unknown because both a height and weight (above a minimum accepted value) are required for this calculation. No results found for this basename: VANCOTROUGH, Leodis Binet, VANCORANDOM, GENTTROUGH, GENTPEAK, GENTRANDOM, TOBRATROUGH, TOBRAPEAK, TOBRARND, AMIKACINPEAK, AMIKACINTROU, AMIKACIN,  in the last 72 hours   Microbiology: Recent Results (from the past 720 hour(s))  SURGICAL PCR SCREEN     Status: None   Collection Time    03/10/13 10:34 AM      Result Value Range Status   MRSA, PCR NEGATIVE  NEGATIVE Final   Staphylococcus aureus NEGATIVE  NEGATIVE Final   Comment:            The Xpert SA Assay (FDA     approved for NASAL specimens     in patients over 58 years of age),     is one component of     a comprehensive surveillance     program.  Test performance has     been validated by [employer] for patients greater     than or equal to 84 year old.     It is not intended     to diagnose infection nor to     guide or monitor treatment.    Medical History: Past Medical History  Diagnosis Date  . Anxiety     takes Xanax daily  . Depression     takes Effexor daily  . PONV (postoperative nausea and vomiting)   . Hypertension     takes Amlodipine and Lisinopril daily  . Hyperlipidemia     takes Pravastatin daily  . Asthma    inhaler prn and Singulair daily;OTC Zyrtec daily  . Pneumonia     hx of;last time about 66yrs ago  . History of bronchitis     last time less than a yr ago  . Shortness of breath     with exertion  . H/O: pituitary tumor   . Weakness     left leg  . Arthritis   . Joint pain   . Chronic back pain     stenosis  . GERD (gastroesophageal reflux disease)     takes Omeprazole daily  . History of colon polyps   . Diverticulosis   . History of blood transfusion     no abnormal reaction noted  . Hypothyroidism     takes synthroid and cytomel daily  . Insomnia     takes Xanax nightly  . Gallstones     Medications:  Scheduled:  . [START ON 03/19/2013] amLODipine  5 mg Oral Daily  . dexamethasone      . dexamethasone  4 mg Intravenous Q6H   Or  . dexamethasone  4 mg Oral Q6H  . [START ON 03/19/2013] lisinopril  20 mg Oral Daily   And  . [START ON 03/19/2013] hydrochlorothiazide  25 mg Oral Daily  .  HYDROmorphone      . ketorolac  30 mg Intravenous Q6H  . [START ON 03/19/2013] levothyroxine  44 mcg Oral QAC breakfast  . [START ON 03/19/2013] liothyronine  25 mcg Oral QAC breakfast  . montelukast  10 mg Oral QHS  . ondansetron      . pantoprazole (PROTONIX) IV  40 mg Intravenous QHS  . scopolamine  1 patch Transdermal Q72H  . scopolamine      . simvastatin  5 mg Oral q1800  . sodium chloride  3 mL Intravenous Q12H  . venlafaxine XR  150 mg Oral BID   Infusions:  . sodium chloride    . dextrose 5 % and 0.45 % NaCl with KCl 20 mEq/L     Assessment: 57 yo female will be put on vancomycin x 1 for surgical prophylaxis s/p left lumbar microdisectomy.  Per RN, patient does not have a drain.  Patient received one dose of vancomycin 1500mg  iv at 1030 today.  Wt 111.2 kg.  SCr on 10/27 was 0.79.  Goal of Therapy:  Vancomycin trough level 15-20 mcg/ml  Plan:  1) Vancomycin 1g iv x1 at 2230 tonight 2) Pharmacy will sign off  Gerrie Castiglia, Tsz-Yin 03/18/2013,2:57 PM

## 2013-03-18 NOTE — Anesthesia Preprocedure Evaluation (Addendum)
Anesthesia Evaluation  Patient identified by MRN, date of birth, ID band Patient awake    Reviewed: Allergy & Precautions, H&P , NPO status , Patient's Chart, lab work & pertinent test results  History of Anesthesia Complications (+) PONV  Airway Mallampati: I TM Distance: >3 FB Neck ROM: Full    Dental  (+) Teeth Intact and Dental Advisory Given   Pulmonary shortness of breath and with exertion, asthma ,  breath sounds clear to auscultation        Cardiovascular hypertension, Pt. on medications Rhythm:Regular Rate:Normal     Neuro/Psych Anxiety Depression    GI/Hepatic GERD-  Medicated and Controlled,  Endo/Other  Hypothyroidism Morbid obesity  Renal/GU      Musculoskeletal   Abdominal   Peds  Hematology   Anesthesia Other Findings   Reproductive/Obstetrics                          Anesthesia Physical Anesthesia Plan  ASA: II  Anesthesia Plan: General   Post-op Pain Management:    Induction: Intravenous  Airway Management Planned: Oral ETT  Additional Equipment:   Intra-op Plan:   Post-operative Plan: Extubation in OR  Informed Consent: I have reviewed the patients History and Physical, chart, labs and discussed the procedure including the risks, benefits and alternatives for the proposed anesthesia with the patient or authorized representative who has indicated his/her understanding and acceptance.   Dental advisory given  Plan Discussed with: CRNA, Anesthesiologist and Surgeon  Anesthesia Plan Comments:         Anesthesia Quick Evaluation

## 2013-03-18 NOTE — Anesthesia Postprocedure Evaluation (Signed)
  Anesthesia Post-op Note  Patient: Whitney Higgins  Procedure(s) Performed: Procedure(s) with comments: LEFT LUMBAR FOUR-FIVE,LUMBAR FIVE-SACRAL ONE MICRODISCECTOMY (Left) - left  Patient Location: PACU  Anesthesia Type:General  Level of Consciousness: awake, alert  and oriented  Airway and Oxygen Therapy: Patient Spontanous Breathing and Patient connected to nasal cannula oxygen  Post-op Pain: none  Post-op Assessment: Post-op Vital signs reviewed  Post-op Vital Signs: Reviewed  Complications: No apparent anesthesia complications

## 2013-03-18 NOTE — Anesthesia Procedure Notes (Signed)
Date/Time: 03/18/2013 10:52 AM Performed by: Rogelia Boga Pre-anesthesia Checklist: Patient identified, Emergency Drugs available, Suction available, Patient being monitored and Timeout performed Patient Re-evaluated:Patient Re-evaluated prior to inductionOxygen Delivery Method: Circle system utilized Preoxygenation: Pre-oxygenation with 100% oxygen Intubation Type: IV induction Ventilation: Mask ventilation without difficulty Laryngoscope Size: Mac and 4 Grade View: Grade I Tube type: Oral Tube size: 7.5 mm Number of attempts: 1 Airway Equipment and Method: Stylet Placement Confirmation: ETT inserted through vocal cords under direct vision,  positive ETCO2 and breath sounds checked- equal and bilateral Secured at: 22 cm Tube secured with: Tape Dental Injury: Teeth and Oropharynx as per pre-operative assessment

## 2013-03-18 NOTE — Preoperative (Signed)
Beta Blockers   Reason not to administer Beta Blockers:Not Applicable 

## 2013-03-18 NOTE — Progress Notes (Signed)
Utilization Review Completed.Allysson Rinehimer T11/08/2012  

## 2013-03-18 NOTE — Transfer of Care (Signed)
Immediate Anesthesia Transfer of Care Note  Patient: Whitney Higgins  Procedure(s) Performed: Procedure(s) with comments: LEFT LUMBAR FOUR-FIVE,LUMBAR FIVE-SACRAL ONE MICRODISCECTOMY (Left) - left  Patient Location: PACU  Anesthesia Type:General  Level of Consciousness: awake, alert , oriented and patient cooperative  Airway & Oxygen Therapy: Patient Spontanous Breathing and Patient connected to nasal cannula oxygen  Post-op Assessment: Report given to PACU RN, Post -op Vital signs reviewed and stable and Patient moving all extremities X 4  Post vital signs: Reviewed and stable  Complications: No apparent anesthesia complications

## 2013-03-19 MED ORDER — HYDROMORPHONE HCL 2 MG PO TABS: 2.0000 mg | ORAL_TABLET | ORAL | Status: DC | PRN

## 2013-03-19 NOTE — Discharge Summary (Signed)
  Physician Discharge Summary  Patient ID: Whitney Higgins MRN: [id number] DOB/AGE: Nov 24, 1955 57 y.o.  Admit date: 03/18/2013 Discharge date: 03/19/2013  Admission Diagnoses:  Discharge Diagnoses:  Active Problems:   * No active hospital problems. *   Discharged Condition: good  Hospital Course: Surgery one day for lumbar issues. Did well. No leg pain post op. Wound fine. Ambulated well. Home pod 1, specific instructions given.  Consults: None  Significant Diagnostic Studies: none  Treatments: surgery: L45 L5S1 discectomy  Discharge Exam: Blood pressure 110/68, pulse 82, temperature 98.6 F (37 C), temperature source Oral, resp. rate 16, SpO2 94.00%. Incision/Wound:clean and dry; no new neuro issues  Disposition: Final discharge disposition not confirmed     Medication List    ASK your doctor about these medications       acetaminophen 500 MG tablet  Commonly known as:  TYLENOL  Take 1,000 mg by mouth every 6 (six) hours as needed for pain.     albuterol 108 (90 BASE) MCG/ACT inhaler  Commonly known as:  PROVENTIL HFA;VENTOLIN HFA  Inhale 2 puffs into the lungs every 6 (six) hours as needed for wheezing.     ALPRAZolam 0.5 MG tablet  Commonly known as:  XANAX  Take 0.25 mg by mouth at bedtime as needed for sleep.     amLODipine 5 MG tablet  Commonly known as:  NORVASC  Take 5 mg by mouth daily.     cetirizine 10 MG tablet  Commonly known as:  ZYRTEC  Take 10 mg by mouth daily.     levothyroxine 88 MCG tablet  Commonly known as:  SYNTHROID, LEVOTHROID  Take 44 mcg by mouth daily before breakfast.     liothyronine 25 MCG tablet  Commonly known as:  CYTOMEL  Take 25 mcg by mouth daily.     lisinopril-hydrochlorothiazide 20-25 MG per tablet  Commonly known as:  PRINZIDE,ZESTORETIC  Take 1 tablet by mouth daily.     montelukast 10 MG tablet  Commonly known as:  SINGULAIR  Take 10 mg by mouth daily.     omeprazole 20 MG capsule  Commonly known as:   PRILOSEC  Take 20 mg by mouth daily as needed (for reflux).     pravastatin 40 MG tablet  Commonly known as:  PRAVACHOL  Take 40 mg by mouth daily.     venlafaxine XR 150 MG 24 hr capsule  Commonly known as:  EFFEXOR-XR  Take 150 mg by mouth 2 (two) times daily.         At home rest most of the time. Get up 9 or 10 times each day and take a 15 or 20 minute walk. No riding in the car and to your first postoperative appointment. If you have neck surgery you may shower from the chest down starting on the third postoperative day. If you had back surgery he may start showering on the third postoperative day with saran wrap wrapped around your incisional area 3 times. After the shower remove the saran wrap. Take pain medicine as needed and other medications as instructed. Call my office for an appointment.  SignedReinaldo Meeker, MD 03/19/2013, 9:04 AM

## 2013-03-19 NOTE — Progress Notes (Signed)
Pt doing very well. Pt and husband given D/C instructions with Rx, verbal understanding was given. Pt D/C'd home via wheelchair with husband @ [phone] per MD order. Pt stable @ time of D/C, no other needs @ this time. Rema Fendt, RN

## 2013-03-20 ENCOUNTER — Encounter (HOSPITAL_COMMUNITY): Payer: Self-pay | Admitting: Neurosurgery

## 2014-04-20 DIAGNOSIS — M7541 Impingement syndrome of right shoulder: Secondary | ICD-10-CM | POA: Insufficient documentation

## 2014-09-08 DIAGNOSIS — M5136 Other intervertebral disc degeneration, lumbar region: Secondary | ICD-10-CM | POA: Insufficient documentation

## 2014-09-08 DIAGNOSIS — D352 Benign neoplasm of pituitary gland: Secondary | ICD-10-CM | POA: Insufficient documentation

## 2014-09-08 DIAGNOSIS — Z9109 Other allergy status, other than to drugs and biological substances: Secondary | ICD-10-CM | POA: Insufficient documentation

## 2014-09-08 DIAGNOSIS — F329 Major depressive disorder, single episode, unspecified: Secondary | ICD-10-CM | POA: Insufficient documentation

## 2014-09-08 DIAGNOSIS — G43909 Migraine, unspecified, not intractable, without status migrainosus: Secondary | ICD-10-CM | POA: Insufficient documentation

## 2014-09-08 DIAGNOSIS — I951 Orthostatic hypotension: Secondary | ICD-10-CM | POA: Insufficient documentation

## 2014-09-08 DIAGNOSIS — E039 Hypothyroidism, unspecified: Secondary | ICD-10-CM | POA: Insufficient documentation

## 2014-09-08 DIAGNOSIS — I10 Essential (primary) hypertension: Secondary | ICD-10-CM | POA: Insufficient documentation

## 2014-09-08 DIAGNOSIS — E7800 Pure hypercholesterolemia, unspecified: Secondary | ICD-10-CM | POA: Insufficient documentation

## 2014-09-08 DIAGNOSIS — J45909 Unspecified asthma, uncomplicated: Secondary | ICD-10-CM | POA: Insufficient documentation

## 2014-11-13 DIAGNOSIS — M19011 Primary osteoarthritis, right shoulder: Secondary | ICD-10-CM | POA: Insufficient documentation

## 2014-12-21 DIAGNOSIS — N951 Menopausal and female climacteric states: Secondary | ICD-10-CM | POA: Insufficient documentation

## 2015-01-21 DIAGNOSIS — R9431 Abnormal electrocardiogram [ECG] [EKG]: Secondary | ICD-10-CM | POA: Insufficient documentation

## 2015-05-14 DIAGNOSIS — S43499A Other sprain of unspecified shoulder joint, initial encounter: Secondary | ICD-10-CM | POA: Insufficient documentation

## 2015-05-14 DIAGNOSIS — M19011 Primary osteoarthritis, right shoulder: Secondary | ICD-10-CM | POA: Insufficient documentation

## 2015-05-14 DIAGNOSIS — M75121 Complete rotator cuff tear or rupture of right shoulder, not specified as traumatic: Secondary | ICD-10-CM | POA: Insufficient documentation

## 2015-05-26 DIAGNOSIS — F3342 Major depressive disorder, recurrent, in full remission: Secondary | ICD-10-CM | POA: Insufficient documentation

## 2015-05-26 DIAGNOSIS — F419 Anxiety disorder, unspecified: Secondary | ICD-10-CM | POA: Insufficient documentation

## 2015-05-26 DIAGNOSIS — M5416 Radiculopathy, lumbar region: Secondary | ICD-10-CM | POA: Insufficient documentation

## 2015-05-26 DIAGNOSIS — M47816 Spondylosis without myelopathy or radiculopathy, lumbar region: Secondary | ICD-10-CM | POA: Insufficient documentation

## 2015-05-26 DIAGNOSIS — M1990 Unspecified osteoarthritis, unspecified site: Secondary | ICD-10-CM | POA: Insufficient documentation

## 2015-05-26 DIAGNOSIS — M2010 Hallux valgus (acquired), unspecified foot: Secondary | ICD-10-CM | POA: Insufficient documentation

## 2015-05-26 DIAGNOSIS — J45901 Unspecified asthma with (acute) exacerbation: Secondary | ICD-10-CM | POA: Insufficient documentation

## 2015-09-09 DIAGNOSIS — E559 Vitamin D deficiency, unspecified: Secondary | ICD-10-CM | POA: Insufficient documentation

## 2015-09-09 DIAGNOSIS — G603 Idiopathic progressive neuropathy: Secondary | ICD-10-CM | POA: Insufficient documentation

## 2016-05-18 ENCOUNTER — Institutional Professional Consult (permissible substitution): Payer: Medicare Other | Admitting: Internal Medicine

## 2016-06-27 DIAGNOSIS — I2584 Coronary atherosclerosis due to calcified coronary lesion: Secondary | ICD-10-CM | POA: Insufficient documentation

## 2016-06-27 DIAGNOSIS — I2510 Atherosclerotic heart disease of native coronary artery without angina pectoris: Secondary | ICD-10-CM | POA: Insufficient documentation

## 2016-07-10 DIAGNOSIS — R42 Dizziness and giddiness: Secondary | ICD-10-CM | POA: Insufficient documentation

## 2016-09-05 DIAGNOSIS — S8264XA Nondisplaced fracture of lateral malleolus of right fibula, initial encounter for closed fracture: Secondary | ICD-10-CM | POA: Insufficient documentation

## 2017-06-14 ENCOUNTER — Encounter: Payer: Self-pay | Admitting: Allergy and Immunology

## 2017-06-14 ENCOUNTER — Ambulatory Visit (INDEPENDENT_AMBULATORY_CARE_PROVIDER_SITE_OTHER): Payer: Medicare Other | Admitting: Allergy and Immunology

## 2017-06-14 VITALS — BP 120/80 | HR 60 | Temp 97.6°F | Resp 18 | Ht 64.17 in | Wt 281.0 lb

## 2017-06-14 DIAGNOSIS — J3089 Other allergic rhinitis: Secondary | ICD-10-CM | POA: Diagnosis not present

## 2017-06-14 DIAGNOSIS — J4551 Severe persistent asthma with (acute) exacerbation: Secondary | ICD-10-CM

## 2017-06-14 DIAGNOSIS — J329 Chronic sinusitis, unspecified: Secondary | ICD-10-CM

## 2017-06-14 DIAGNOSIS — J4550 Severe persistent asthma, uncomplicated: Secondary | ICD-10-CM | POA: Insufficient documentation

## 2017-06-14 MED ORDER — AZELASTINE HCL 0.1 % NA SOLN: NASAL | 5 refills | Status: DC

## 2017-06-14 MED ORDER — TIOTROPIUM BROMIDE MONOHYDRATE 1.25 MCG/ACT IN AERS: 2.0000 | INHALATION_SPRAY | Freq: Every day | RESPIRATORY_TRACT | 5 refills | Status: DC

## 2017-06-14 MED ORDER — BUDESONIDE-FORMOTEROL FUMARATE 160-4.5 MCG/ACT IN AERO: 2.0000 | INHALATION_SPRAY | Freq: Two times a day (BID) | RESPIRATORY_TRACT | 5 refills | Status: DC

## 2017-06-14 MED ORDER — CARBINOXAMINE MALEATE 4 MG PO TABS: ORAL_TABLET | ORAL | 5 refills | Status: DC

## 2017-06-14 NOTE — Progress Notes (Signed)
New Patient Note  RE: Whitney Higgins MRN: [id number] DOB: 01/21/1956 Date of Office Visit: 06/14/2017  Referring provider: No ref. provider found Primary care provider: Rubie Maid, MD  Chief Complaint: Allergic Rhinitis  and Asthma   History of present illness: Whitney Higgins is a 62 y.o. female presenting today for evaluation of rhinitis and asthma. She reports that prior to turning 62 years old she did not experience significant upper or lower respiratory symptoms.  However, after turning 38 she began to develop symptoms consistent with asthma and allergic rhinitis.  She was skin tested 22 years ago and was "pretty much allergic to everything".  She was started on immunotherapy for 3 years with some symptom relief.  Around that same time she moved to [facility] and did not have any problems until she moved back to [place] approximately 6 years ago.  Over the past 6 years she has experienced progressive increase in coughing, chest tightness, wheezing, and dyspnea.  She states that last year she was "sick for 5 months".  She reports that her "immune system is shot" because over the past year she has had multiple sinus infections and bronchitis.  She denies skin infections and UTIs.  She experiences nasal congestion, rhinorrhea, sneezing, thick postnasal drainage, ocular pruritus, and sinus pressure.  These symptoms occur year around but are more frequent and severe in the spring time and in the fall.  She takes cetirizine on a daily basis without adequate symptom relief.  She complains of persistent cough.  The cough seems to originate as a tickle at the base of her throat.  She admits to postnasal drainage and denies heartburn.  She is interested in the possibility of starting aeroallergen immunotherapy to reduce symptoms and decrease medication requirement.   Assessment and plan: Severe persistent asthma  A severe asthma screening panel has been ordered to help identify phenotype  and assess candidacy for a biologic agent.  These labs include: CBC with differential, serum IgE level, Aspergillus fumigatus serum specific IgE level, Aspergillus IgG precipitins panel, alpha-1 antitrypsin, and ANCA titers.  Prednisone has been provided, 40 mg x3 days, 20 mg x1 day, 10 mg x1 day, then stop.  A prescription has been provided for Symbicort (budesonide/formoterol) 160/4.5 g,  2 inhalations twice a day. To maximize pulmonary deposition, a spacer has been provided along with instructions for its proper administration with an HFA inhaler.  A prescription has been provided for Spiriva Respimat 1.25 g, 2 inhalations daily.  The patient has been asked to contact me if her symptoms persist or progress. Otherwise, she may return for follow up in 6 weeks.   Perennial allergic rhinitis with a predominant nonallergic component The patient's skin tests were borderline positive/equivocal to a few perennial allergens on intradermal testing which leads me to believe that her nasal/sinus symptoms are predominantly of nonallergic etiology.  A prescription has been provided for azelastine nasal spray, 1-2 sprays per nostril 2 times daily as needed. Proper nasal spray technique has been discussed and demonstrated.   Nasal saline lavage (NeilMed) has been recommended as needed and prior to medicated nasal sprays along with instructions for proper administration.  A prescription has been provided for carbinoxamine 4 mg every 8 hours if needed.  Recurrent sinus infections Immunocompetence will be assessed with labs.  The following labs have been ordered: CBC with differential, IgG, IgA and IgM, tetanus IgG titers, and pneumococcal IgG titers.  If pre-vaccination titers are low, post-vaccination titers will be drawn  to assess response.    Treatment plan as outlined above for allergic rhinitis.  The patient will be called with further recommendations and follow-up instructions once the labs have  returned.   Meds ordered this encounter  Medications  . budesonide-formoterol (SYMBICORT) 160-4.5 MCG/ACT inhaler    Sig: Inhale 2 puffs into the lungs 2 (two) times daily. To prevent coughing or wheezing. Rinse, garle and spit out after use    Dispense:  1 Inhaler    Refill:  5  . Tiotropium Bromide Monohydrate (SPIRIVA RESPIMAT) 1.25 MCG/ACT AERS    Sig: Inhale 2 puffs into the lungs daily.    Dispense:  1 Inhaler    Refill:  5  . azelastine (ASTELIN) 0.1 % nasal spray    Sig: 1-2 sprays in each nostril twice a day as needed    Dispense:  30 mL    Refill:  5  . Carbinoxamine Maleate 4 MG TABS    Sig: 4 mg every 8 hours if needed    Dispense:  60 each    Refill:  5    RUN AS CASH PAY ONLY! BIN [id number] [id number] cash pay    Diagnostics: Spirometry: FVC was 1.59 L (49% predicted), FEV1 did not register on the prebronchodilator effort.  Postbronchodilator FVC was 1.78 L and FEV1 was 1.67 L (67% predicted).  Please see scanned spirometry results for details. Epicutaneous testing: Negative despite a positive histamine control. Intradermal testing: Borderline positive/equivocal to mold, cat hair, and cockroach antigen.   Physical examination: Blood pressure 120/80, pulse 60, temperature 97.6 F (36.4 C), temperature source Oral, resp. rate 18, height 5' 4.17" (1.63 m), weight 281 lb (127.5 kg), SpO2 94 %.  General: Alert, interactive, in no acute distress. HEENT: TMs pearly gray, turbinates edematous with thick discharge, post-pharynx erythematous. Neck: Supple without lymphadenopathy. Lungs: Mildly decreased breath sounds bilaterally without wheezing, rhonchi or rales. CV: Normal S1, S2 without murmurs. Abdomen: Nondistended, nontender. Skin: Warm and dry, without lesions or rashes. Extremities:  No clubbing, cyanosis or edema. Neuro:   Grossly intact.  Review of systems:  Review of systems negative except as noted in HPI / PMHx or noted  below: Review of Systems  Constitutional: Negative.   HENT: Negative.   Eyes: Negative.   Respiratory: Negative.   Cardiovascular: Negative.   Gastrointestinal: Negative.   Genitourinary: Negative.   Musculoskeletal: Negative.   Skin: Negative.   Neurological: Negative.   Endo/Heme/Allergies: Negative.   Psychiatric/Behavioral: Negative.     Past medical history:  Past Medical History:  Diagnosis Date  . Anxiety    takes Xanax daily  . Arthritis   . Asthma    inhaler prn and Singulair daily;OTC Zyrtec daily  . Chronic back pain    stenosis  . Depression    takes Effexor daily  . Diverticulosis   . Gallstones   . GERD (gastroesophageal reflux disease)    takes Omeprazole daily  . H/O: pituitary tumor   . History of blood transfusion    no abnormal reaction noted  . History of bronchitis    last time less than a yr ago  . History of colon polyps   . Hyperlipidemia    takes Pravastatin daily  . Hypertension    takes Amlodipine and Lisinopril daily  . Hypothyroidism    takes synthroid and cytomel daily  . Insomnia    takes Xanax nightly  . Joint pain   . Pneumonia    hx of;last time about  57yrs ago  . PONV (postoperative nausea and vomiting)   . Shortness of breath    with exertion  . Weakness    left leg    Past surgical history:  Past Surgical History:  Procedure Laterality Date  . APPENDECTOMY  1974  . CESAREAN SECTION  1981  . COLONOSCOPY    . DILATION AND CURETTAGE OF UTERUS  1979  . epidural injections    . EYE SURGERY     at age 33;vision correction  . JOINT REPLACEMENT  2010   right hip  . LUMBAR LAMINECTOMY/DECOMPRESSION MICRODISCECTOMY Left 03/18/2013   Procedure: LEFT LUMBAR FOUR-FIVE,LUMBAR FIVE-SACRAL ONE MICRODISCECTOMY;  Surgeon: Faythe Ghee, MD;  Location: [facility];  Service: Neurosurgery;  Laterality: Left;  left  . partial removal of right kidney  1971  . pituitary tumor removed  1983    Family history: Family History    Problem Relation Age of Onset  . Asthma Mother   . Allergic rhinitis Mother   . Allergic rhinitis Father   . Allergic rhinitis Sister   . Allergic rhinitis Brother     Social history: Social History   Socioeconomic History  . Marital status: Married    Spouse name: Not on file  . Number of children: Not on file  . Years of education: Not on file  . Highest education level: Not on file  Social Needs  . Financial resource strain: Not on file  . Food insecurity - worry: Not on file  . Food insecurity - inability: Not on file  . Transportation needs - medical: Not on file  . Transportation needs - non-medical: Not on file  Occupational History  . Not on file  Tobacco Use  . Smoking status: Never Smoker  . Smokeless tobacco: Never Used  Substance and Sexual Activity  . Alcohol use: Yes    Comment: rarely red wine  . Drug use: No  . Sexual activity: Yes  Other Topics Concern  . Not on file  Social History Narrative  . Not on file   Environmental History: The patient lives in a townhouse with carpeting throughout the dog in the home which has access to her bedroom.  No known mold/water damage in the home.  She is a non-smoker.  Allergies as of 06/14/2017      Reactions   Codeine Nausea And Vomiting   Clarithromycin Rash   Penicillins Rash      Medication List        Accurate as of 06/14/17  6:17 PM. Always use your most recent med list.          acetaminophen 500 MG tablet Commonly known as:  TYLENOL Take 1,000 mg by mouth every 6 (six) hours as needed for pain.   albuterol (2.5 MG/3ML) 0.083% nebulizer solution Commonly known as:  PROVENTIL Take 3 mLs (2.5 mg total) by nebulization 3 times daily.   albuterol 108 (90 Base) MCG/ACT inhaler Commonly known as:  PROVENTIL HFA;VENTOLIN HFA Inhale 2 puffs into the lungs every 6 (six) hours as needed for wheezing.   ALPRAZolam 0.5 MG tablet Commonly known as:  XANAX Take 0.25 mg by mouth at bedtime as needed for  sleep.   amLODipine 5 MG tablet Commonly known as:  NORVASC Take 5 mg by mouth daily.   atorvastatin 20 MG tablet Commonly known as:  LIPITOR Take 20 mg by mouth daily.   azelastine 0.1 % nasal spray Commonly known as:  ASTELIN 1-2 sprays in each  nostril twice a day as needed   budesonide-formoterol 160-4.5 MCG/ACT inhaler Commonly known as:  SYMBICORT Inhale 2 puffs into the lungs 2 (two) times daily. To prevent coughing or wheezing. Rinse, garle and spit out after use   Carbinoxamine Maleate 4 MG Tabs 4 mg every 8 hours if needed   cetirizine 10 MG tablet Commonly known as:  ZYRTEC Take 10 mg by mouth daily.   FLONASE 50 MCG/ACT nasal spray Generic drug:  fluticasone Place 1 spray into both nostrils 2 (two) times daily as needed for allergies or rhinitis.   HYDROmorphone 2 MG tablet Commonly known as:  DILAUDID Take 1 tablet (2 mg total) by mouth every 4 (four) hours as needed for moderate pain or severe pain.   KLOR-CON 10 10 MEQ tablet Generic drug:  potassium chloride TAKE 1 TABLET BY MOUTH EVERY DAY   levothyroxine 88 MCG tablet Commonly known as:  SYNTHROID, LEVOTHROID Take 44 mcg by mouth daily before breakfast.   liothyronine 25 MCG tablet Commonly known as:  CYTOMEL Take 25 mcg by mouth daily.   lisinopril-hydrochlorothiazide 20-25 MG tablet Commonly known as:  PRINZIDE,ZESTORETIC Take 1 tablet by mouth daily.   montelukast 10 MG tablet Commonly known as:  SINGULAIR Take 10 mg by mouth daily.   omeprazole 20 MG capsule Commonly known as:  PRILOSEC Take 20 mg by mouth daily as needed (for reflux).   pravastatin 40 MG tablet Commonly known as:  PRAVACHOL Take 40 mg by mouth daily.   Tiotropium Bromide Monohydrate 1.25 MCG/ACT Aers Commonly known as:  SPIRIVA RESPIMAT Inhale 2 puffs into the lungs daily.   venlafaxine XR 150 MG 24 hr capsule Commonly known as:  EFFEXOR-XR Take 150 mg by mouth 2 (two) times daily.       Known medication  allergies: Allergies  Allergen Reactions  . Codeine Nausea And Vomiting  . Clarithromycin Rash  . Penicillins Rash    I appreciate the opportunity to take part in [facility] care. Please do not hesitate to contact me with questions.  Sincerely,   R. Edgar Frisk, MD

## 2017-06-14 NOTE — Assessment & Plan Note (Addendum)
   A severe asthma screening panel has been ordered to help identify phenotype and assess candidacy for a biologic agent.  These labs include: CBC with differential, serum IgE level, Aspergillus fumigatus serum specific IgE level, Aspergillus IgG precipitins panel, alpha-1 antitrypsin, and ANCA titers.  Prednisone has been provided, 40 mg x3 days, 20 mg x1 day, 10 mg x1 day, then stop.  A prescription has been provided for Symbicort (budesonide/formoterol) 160/4.5 g, 2 inhalations twice a day. To maximize pulmonary deposition, a spacer has been provided along with instructions for its proper administration with an HFA inhaler.  A prescription has been provided for Spiriva Respimat 1.25 g, 2 inhalations daily.  The patient has been asked to contact me if her symptoms persist or progress. Otherwise, she may return for follow up in 6 weeks.

## 2017-06-14 NOTE — Patient Instructions (Addendum)
Severe persistent asthma  A severe asthma screening panel has been ordered to help identify phenotype and assess candidacy for a biologic agent.  These labs include: CBC with differential, serum IgE level, Aspergillus fumigatus serum specific IgE level, Aspergillus IgG precipitins panel, alpha-1 antitrypsin, and ANCA titers.  Prednisone has been provided, 40 mg x3 days, 20 mg x1 day, 10 mg x1 day, then stop.  A prescription has been provided for Symbicort (budesonide/formoterol) 160/4.5 g,  2 inhalations twice a day. To maximize pulmonary deposition, a spacer has been provided along with instructions for its proper administration with an HFA inhaler.  A prescription has been provided for Spiriva Respimat 1.25 g, 2 inhalations daily.  The patient has been asked to contact me if her symptoms persist or progress. Otherwise, she may return for follow up in 6 weeks.   Perennial allergic rhinitis with a predominant nonallergic component The patient's skin tests were borderline positive/equivocal to a few perennial allergens on intradermal testing which leads me to believe that her nasal/sinus symptoms are predominantly of nonallergic etiology.  A prescription has been provided for azelastine nasal spray, 1-2 sprays per nostril 2 times daily as needed. Proper nasal spray technique has been discussed and demonstrated.   Nasal saline lavage (NeilMed) has been recommended as needed and prior to medicated nasal sprays along with instructions for proper administration.  A prescription has been provided for carbinoxamine 4 mg every 8 hours if needed.  Recurrent sinus infections Immunocompetence will be assessed with labs.  The following labs have been ordered: CBC with differential, IgG, IgA and IgM, tetanus IgG titers, and pneumococcal IgG titers.  If pre-vaccination titers are low, post-vaccination titers will be drawn to assess response.    Treatment plan as outlined above for allergic  rhinitis.  The patient will be called with further recommendations and follow-up instructions once the labs have returned.   Return in about 6 weeks (around 07/26/2017), or if symptoms worsen or fail to improve.  Control of Mold Allergen  Mold and fungi can grow on a variety of surfaces provided certain temperature and moisture conditions exist.  Outdoor molds grow on plants, decaying vegetation and soil.  The major outdoor mold, Alternaria and Cladosporium, are found in very high numbers during hot and dry conditions.  Generally, a late Summer - Fall peak is seen for common outdoor fungal spores.  Rain will temporarily lower outdoor mold spore count, but counts rise rapidly when the rainy period ends.  The most important indoor molds are Aspergillus and Penicillium.  Dark, humid and poorly ventilated basements are ideal sites for mold growth.  The next most common sites of mold growth are the bathroom and the kitchen.  Outdoor [employer] 2. Use air conditioning and keep windows closed 3. Avoid exposure to decaying vegetation. 4. Avoid leaf raking. 5. Avoid grain handling. 6. Consider wearing a face mask if working in moldy areas.  Indoor Mold Control 2. Maintain humidity below 50%. 3. Clean washable surfaces with 5% bleach solution. 4. Remove sources e.g. Contaminated carpets.  Control of Dog or Cat Allergen  Avoidance is the best way to manage a dog or cat allergy. If you have a dog or cat and are allergic to dog or cats, consider removing the dog or cat from the home. If you have a dog or cat but don't want to find it a new home, or if your family wants a pet even though someone in the household is allergic, here are some strategies  that may help keep symptoms at bay:  1. Keep the pet out of your bedroom and restrict it to only a few rooms. Be advised that keeping the dog or cat in only one room will not limit the allergens to that room. 2. Don't pet, hug or kiss the dog or cat; if  you do, wash your hands with soap and water. 3. High-efficiency particulate air (HEPA) cleaners run continuously in a bedroom or living room can reduce allergen levels over time. 4. Place electrostatic material sheet in the air inlet vent in the bedroom. 5. Regular use of a high-efficiency vacuum cleaner or a central vacuum can reduce allergen levels. 6. Giving your dog or cat a bath at least once a week can reduce airborne allergen.  Control of Cockroach Allergen  Cockroach allergen has been identified as an important cause of acute attacks of asthma, especially in urban settings.  There are fifty-five species of cockroach that exist in the [place], however only three, the [place], [place] species produce allergen that can affect patients with Asthma.  Allergens can be obtained from fecal particles, egg casings and secretions from cockroaches.    1. Remove food sources. 2. Reduce access to water. 3. Seal access and entry points. 4. Spray runways with 0.5-1% Diazinon or Chlorpyrifos 5. Blow boric acid power under stoves and refrigerator. 6. Place bait stations (hydramethylnon) at feeding sites.

## 2017-06-14 NOTE — Assessment & Plan Note (Signed)
The patient's skin tests were borderline positive/equivocal to a few perennial allergens on intradermal testing which leads me to believe that her nasal/sinus symptoms are predominantly of nonallergic etiology.  A prescription has been provided for azelastine nasal spray, 1-2 sprays per nostril 2 times daily as needed. Proper nasal spray technique has been discussed and demonstrated.   Nasal saline lavage (NeilMed) has been recommended as needed and prior to medicated nasal sprays along with instructions for proper administration.  A prescription has been provided for carbinoxamine 4 mg every 8 hours if needed.

## 2017-06-14 NOTE — Assessment & Plan Note (Signed)
Immunocompetence will be assessed with labs.  The following labs have been ordered: CBC with differential, IgG, IgA and IgM, tetanus IgG titers, and pneumococcal IgG titers.  If pre-vaccination titers are low, post-vaccination titers will be drawn to assess response.    Treatment plan as outlined above for allergic rhinitis.  The patient will be called with further recommendations and follow-up instructions once the labs have returned.

## 2017-06-15 ENCOUNTER — Telehealth: Payer: Self-pay

## 2017-06-15 MED ORDER — RYVENT 6 MG PO TABS: 1.0000 | ORAL_TABLET | ORAL | 5 refills | Status: DC

## 2017-06-15 MED ORDER — CARBINOXAMINE MALEATE 6 MG PO TABS: 1.0000 | ORAL_TABLET | ORAL | 5 refills | Status: DC

## 2017-06-15 NOTE — Telephone Encounter (Signed)
So is Ryvent not longer being covered by the cash pay only method?   Salvatore Marvel, MD [facility] [place] [facility] [place]

## 2017-06-15 NOTE — Addendum Note (Signed)
Addended by: Lucrezia Starch I on: 06/15/2017 01:43 PM   Modules accepted: Orders

## 2017-06-15 NOTE — Addendum Note (Signed)
Addended by: Lucrezia Starch I on: 06/15/2017 02:12 PM   Modules accepted: Orders

## 2017-06-15 NOTE — Telephone Encounter (Signed)
I left a detailed message for the patient advising her of the medication change and to call back if she had any further questions.

## 2017-06-15 NOTE — Telephone Encounter (Signed)
Ryvent in carbinoxamine 6mg , so let's just change to that. Send it in as cash pay. Would that work?  Salvatore Marvel, MD [facility] [place] [facility] [place]

## 2017-06-15 NOTE — Telephone Encounter (Signed)
This isn't the Ryvent. I believe it is just the generic carbinoxamine tablets.

## 2017-06-15 NOTE — Telephone Encounter (Signed)
Patient's insurance will not cover carbinoxamine 4 mg tablets. The alternatives are levocetirizine, cetirizine, flunisolide. Please advise and thank you.

## 2017-06-20 LAB — CBC WITH DIFFERENTIAL/PLATELET
Basophils Absolute: 0 10*3/uL (ref 0.0–0.2)
Basos: 0 %
EOS (ABSOLUTE): 0.2 10*3/uL (ref 0.0–0.4)
Eos: 2 %
Hematocrit: 46.2 % (ref 34.0–46.6)
Hemoglobin: 15.3 g/dL (ref 11.1–15.9)
Immature Grans (Abs): 0 10*3/uL (ref 0.0–0.1)
Immature Granulocytes: 0 %
Lymphocytes Absolute: 1.7 10*3/uL (ref 0.7–3.1)
Lymphs: 18 %
MCH: 30.1 pg (ref 26.6–33.0)
MCHC: 33.1 g/dL (ref 31.5–35.7)
MCV: 91 fL (ref 79–97)
Monocytes Absolute: 0.7 10*3/uL (ref 0.1–0.9)
Monocytes: 8 %
Neutrophils Absolute: 6.9 10*3/uL (ref 1.4–7.0)
Neutrophils: 72 %
Platelets: 219 10*3/uL (ref 150–379)
RBC: 5.08 x10E6/uL (ref 3.77–5.28)
RDW: 14 % (ref 12.3–15.4)
WBC: 9.6 10*3/uL (ref 3.4–10.8)

## 2017-06-20 LAB — STREP PNEUMONIAE 23 SEROTYPES IGG
Pneumo Ab Type 1*: 4 ug/mL (ref >1.3)
Pneumo Ab Type 12 (12F)*: 0.2 ug/mL — ABNORMAL LOW (ref >1.3)
Pneumo Ab Type 14*: >24.3 ug/mL (ref >1.3)
Pneumo Ab Type 17 (17F)*: 0.5 ug/mL — ABNORMAL LOW (ref >1.3)
Pneumo Ab Type 19 (19F)*: 10.2 ug/mL (ref >1.3)
Pneumo Ab Type 2*: 3.6 ug/mL (ref >1.3)
Pneumo Ab Type 20*: 3.7 ug/mL (ref >1.3)
Pneumo Ab Type 22 (22F)*: 0.1 ug/mL — ABNORMAL LOW (ref >1.3)
Pneumo Ab Type 23 (23F)*: 1.1 ug/mL — ABNORMAL LOW (ref >1.3)
Pneumo Ab Type 26 (6B)*: 2.1 ug/mL (ref >1.3)
Pneumo Ab Type 3*: 1.2 ug/mL — ABNORMAL LOW (ref >1.3)
Pneumo Ab Type 34 (10A)*: 0.8 ug/mL — ABNORMAL LOW (ref >1.3)
Pneumo Ab Type 4*: 1.1 ug/mL — ABNORMAL LOW (ref >1.3)
Pneumo Ab Type 43 (11A)*: 0.3 ug/mL — ABNORMAL LOW (ref >1.3)
Pneumo Ab Type 5*: 8.5 ug/mL (ref >1.3)
Pneumo Ab Type 51 (7F)*: 0.9 ug/mL — ABNORMAL LOW (ref >1.3)
Pneumo Ab Type 54 (15B)*: 6.5 ug/mL (ref >1.3)
Pneumo Ab Type 56 (18C)*: 6.2 ug/mL (ref >1.3)
Pneumo Ab Type 57 (19A)*: 0.9 ug/mL — ABNORMAL LOW (ref >1.3)
Pneumo Ab Type 68 (9V)*: 1.9 ug/mL (ref >1.3)
Pneumo Ab Type 70 (33F)*: 1.1 ug/mL — ABNORMAL LOW (ref >1.3)
Pneumo Ab Type 8*: 1.3 ug/mL — ABNORMAL LOW (ref >1.3)
Pneumo Ab Type 9 (9N)*: 0.7 ug/mL — ABNORMAL LOW (ref >1.3)

## 2017-06-20 LAB — ALPHA-1 ANTITRYPSIN PHENOTYPE: A-1 Antitrypsin: 162 mg/dL (ref 90–200)

## 2017-06-20 LAB — M003-IGE ASPERGILLUS FUMIGATUS: Aspergillus Fumigatus IgE: <0.1 kU/L

## 2017-06-20 LAB — ASPERGILLUS PRECIPITINS
A.Fumigatus #1 Abs: NEGATIVE
Aspergillus Flavus Antibodies: NEGATIVE
Aspergillus Niger Antibodies: NEGATIVE

## 2017-06-20 LAB — ANCA TITERS
Atypical pANCA: <1:20 {titer}
C-ANCA: <1:20 {titer}
P-ANCA: <1:20 {titer}

## 2017-06-20 LAB — IGG, IGA, IGM
IgA/Immunoglobulin A, Serum: 268 mg/dL (ref 87–352)
IgG (Immunoglobin G), Serum: 1005 mg/dL (ref 700–1600)
IgM (Immunoglobulin M), Srm: 35 mg/dL (ref 26–217)

## 2017-06-20 LAB — IGE: IgE (Immunoglobulin E), Serum: 33 IU/mL (ref 0–100)

## 2017-06-20 LAB — TETANUS ANTIBODY, IGG: Tetanus Ab, IgG: 1.05 IU/mL (ref <0.10)

## 2017-06-21 ENCOUNTER — Telehealth: Payer: Self-pay | Admitting: Allergy and Immunology

## 2017-06-21 DIAGNOSIS — J4551 Severe persistent asthma with (acute) exacerbation: Secondary | ICD-10-CM

## 2017-06-21 MED ORDER — TIOTROPIUM BROMIDE MONOHYDRATE 1.25 MCG/ACT IN AERS: 2.0000 | INHALATION_SPRAY | Freq: Every day | RESPIRATORY_TRACT | 5 refills | Status: DC

## 2017-06-21 NOTE — Telephone Encounter (Signed)
Pt called and needs to have a prio approval  For the Symbicort and Spiriva because the are over $ 2000.00. [facility] in [place] [phone].

## 2017-06-21 NOTE — Telephone Encounter (Signed)
I spoke with the pharmacy and they advised that Whitney Higgins picked up the Symbicort 06-15-17 for 238.10. Her deductible has not been met yet and that is why the Symbicort was so high. I am resending the Spiriva because the pharmacy showed that it never came in. I did try and call the patient but she did not answer and I was not able to leave a message.

## 2017-06-25 NOTE — Telephone Encounter (Signed)
Patient was already aware of this information.

## 2017-07-24 ENCOUNTER — Other Ambulatory Visit: Payer: Self-pay | Admitting: Allergy and Immunology

## 2017-08-08 ENCOUNTER — Ambulatory Visit: Payer: Medicare Other | Admitting: Allergy and Immunology

## 2018-10-09 ENCOUNTER — Encounter: Payer: Self-pay | Admitting: Internal Medicine

## 2018-11-01 ENCOUNTER — Telehealth: Payer: Self-pay | Admitting: Internal Medicine

## 2018-11-01 NOTE — Telephone Encounter (Signed)
Dr. Carlean Purl, pt requested to transfer care to you.  Pt's previous colonoscopy was 12/2017 with Dr. Rolm Bookbinder.  Records will be sent to you for review.  Pt is experiencing symptoms of diarrhea and abdominal pain.  Will you accept this pt?

## 2018-11-21 ENCOUNTER — Encounter: Payer: Self-pay | Admitting: General Surgery

## 2018-11-22 ENCOUNTER — Ambulatory Visit (INDEPENDENT_AMBULATORY_CARE_PROVIDER_SITE_OTHER): Payer: Medicare Other | Admitting: Internal Medicine

## 2018-11-22 ENCOUNTER — Encounter: Payer: Self-pay | Admitting: Internal Medicine

## 2018-11-22 ENCOUNTER — Other Ambulatory Visit: Payer: Self-pay

## 2018-11-22 VITALS — Ht 65.0 in | Wt 271.0 lb

## 2018-11-22 DIAGNOSIS — K529 Noninfective gastroenteritis and colitis, unspecified: Secondary | ICD-10-CM | POA: Diagnosis not present

## 2018-11-22 DIAGNOSIS — K921 Melena: Secondary | ICD-10-CM

## 2018-11-22 DIAGNOSIS — R1031 Right lower quadrant pain: Secondary | ICD-10-CM | POA: Diagnosis not present

## 2018-11-22 MED ORDER — NA SULFATE-K SULFATE-MG SULF 17.5-3.13-1.6 GM/177ML PO SOLN: 1.0000 | Freq: Once | ORAL | 0 refills | Status: AC

## 2018-11-22 NOTE — Patient Instructions (Addendum)
It was good talking with you by phone today, I am sorry you are having the problems we discussed.   We both agree a repeat colonoscopy is appropriate and we will schedule that for July 21.  Because you only had a fair prep the last time I want you to take some extra prep though not a lot.  We will have you take 2 Dulcolax tablets and drink 4 glasses of MiraLAX on the day before you prep just to promote some bowel movements.  This should not be too harsh, I hope.  The colonoscopy prep we use will be Suprep.  Do not drink that too fast or it may cause some nausea.  I will see you on the 21st and we will get this figured out.  Please contact your surgeon, Dr.Teppara and let him know you are having this colonoscopy 3 days before your planned gastric sleeve surgery.  We may need to postpone that depending upon what the colonoscopy shows and it would not be unreasonable to go ahead and move it further out anyway though I can understand why you might want to keep the appointment in place for now.   I appreciate the opportunity to care for you  Gatha Mayer, MD, [facility]

## 2018-11-22 NOTE — Progress Notes (Signed)
TELEHEALTH ENCOUNTER IN SETTING OF COVID-19 PANDEMIC - REQUESTED BY PATIENT SERVICE PROVIDED BY TELEMEDECINE - TYPE: Telephone PATIENT LOCATION: Home PATIENT HAS CONSENTED TO TELEHEALTH VISIT PROVIDER LOCATION: OFFICE REFERRING PROVIDER: Baker Pierini, NP PARTICIPANTS OTHER THAN PATIENT: None TIME SPENT ON CALL: 26 minutes   Whitney Higgins 62 y.o. [birth year] [id number]  Assessment & Plan:   Encounter Diagnoses  Name Primary?  . Blood in stool Yes  . Chronic diarrhea   . RLQ abdominal pain   . Morbid obesity (HCC)-awaiting sleeve gastrectomy 2020      This sounds like it could be inflammatory bowel disease though irritable bowel syndrome and hemorrhoids is certainly in the differential.  She only had a fair prep at her colonoscopy last year so we cannot trust that.  Repeat colonoscopy is indicated.  That will be scheduled   CC: Rubie Maid, MD and Baker Pierini, NP Dr. Demetrius Revel Subjective:   Chief Complaint: Diarrhea and bleeding and mucus and right lower quadrant pain  HPI The patient is a 54 year old white woman (neighbor of 1 of our staff) who has had diarrhea problems dating to last summer.  She started having rectal bleeding and diarrhea last summer when she was at the beach, she will have softer loose stools and even watery stools and variable patterns.  She had rectal bleeding or blood in the stool as well.  Usually bright red.  She scheduled a colonoscopy and had that performed in [facility], by Dr. Rolm Bookbinder and only had a fair prep on January 01, 2018.  Hemorrhoids were seen.  She thinks she took a MiraLAX prep.  She continued to have problems and in January started having a lot of mucus production.  She is planning for sleeve gastrectomy later this month originally scheduled for July 20 21 November 2022 and is on a high-protein diet so her bowel movements have slowed down some.  She has crampy right lower quadrant pain as well.  That goes back to earlier in 2019  at least, I see that she had transabdominal and transvaginal pelvic ultrasound that was unrevealing for that.  Her GI review of systems appears otherwise negative.  No fever chills or rashes reported.  She had mild dysmotility of the esophagus on her pre-bariatric upper GI and some post bulbar duodenitis reported.  Interestingly she said her husband was diagnosed with hemorrhoids and turned out to have colon cancer, fortunately many years ago he is still living.  Allergies  Allergen Reactions  . Codeine Nausea And Vomiting  . Clarithromycin Rash  . Penicillins Rash   Current Meds  Medication Sig  . acetaminophen (TYLENOL) 500 MG tablet Take 1,000 mg by mouth every 6 (six) hours as needed for pain.  Marland Kitchen albuterol (PROVENTIL HFA;VENTOLIN HFA) 108 (90 BASE) MCG/ACT inhaler Inhale 2 puffs into the lungs every 6 (six) hours as needed for wheezing.  Marland Kitchen albuterol (PROVENTIL) (2.5 MG/3ML) 0.083% nebulizer solution Take 3 mLs (2.5 mg total) by nebulization 3 times daily.  Marland Kitchen ALPRAZolam (XANAX) 0.5 MG tablet Take 0.25 mg by mouth at bedtime as needed for sleep.  Marland Kitchen amLODipine (NORVASC) 5 MG tablet Take 5 mg by mouth daily.  Marland Kitchen atorvastatin (LIPITOR) 20 MG tablet Take 20 mg by mouth daily.  Marland Kitchen azelastine (ASTELIN) 0.1 % nasal spray 1-2 sprays in each nostril twice a day as needed  . budesonide-formoterol (SYMBICORT) 160-4.5 MCG/ACT inhaler Inhale 2 puffs into the lungs 2 (two) times daily. To prevent coughing or wheezing. Rinse, garle and  spit out after use  . cetirizine (ZYRTEC) 10 MG tablet Take 10 mg by mouth daily.  . cyanocobalamin 1000 MCG tablet Take 1,000 mcg by mouth daily.  . fluticasone (FLONASE) 50 MCG/ACT nasal spray Place 1 spray into both nostrils 2 (two) times daily as needed for allergies or rhinitis.  Marland Kitchen levothyroxine (SYNTHROID, LEVOTHROID) 88 MCG tablet Take 44 mcg by mouth daily before breakfast.  . liothyronine (CYTOMEL) 25 MCG tablet Take 25 mcg by mouth daily.  Marland Kitchen  lisinopril-hydrochlorothiazide (PRINZIDE,ZESTORETIC) 20-25 MG per tablet Take 1 tablet by mouth daily.  . montelukast (SINGULAIR) 10 MG tablet Take 10 mg by mouth daily.  Marland Kitchen omeprazole (PRILOSEC) 20 MG capsule Take 20 mg by mouth daily as needed (for reflux).  . potassium chloride (KLOR-CON 10) 10 MEQ tablet TAKE 1 TABLET BY MOUTH EVERY DAY  . pravastatin (PRAVACHOL) 40 MG tablet Take 40 mg by mouth daily.  Marland Kitchen RYVENT 6 MG TABS Take 1 tablet by mouth See admin instructions. 3-4 times daily  . Tiotropium Bromide Monohydrate (SPIRIVA RESPIMAT) 1.25 MCG/ACT AERS Inhale 2 puffs into the lungs daily.  Marland Kitchen venlafaxine XR (EFFEXOR-XR) 150 MG 24 hr capsule Take 150 mg by mouth 2 (two) times daily.  Marland Kitchen VITAMIN D PO Take 2,000 Units by mouth daily.  . [DISCONTINUED] HYDROmorphone (DILAUDID) 2 MG tablet Take 1 tablet (2 mg total) by mouth every 4 (four) hours as needed for moderate pain or severe pain.   Past Medical History:  Diagnosis Date  . Anxiety    takes Xanax daily  . Arthritis   . Asthma    inhaler prn and Singulair daily;OTC Zyrtec daily  . Chronic back pain    stenosis  . Depression    takes Effexor daily  . Diverticulosis   . Gallstones   . GERD (gastroesophageal reflux disease)    takes Omeprazole daily  . H/O: pituitary tumor   . History of blood transfusion    no abnormal reaction noted  . History of bronchitis    last time less than a yr ago  . History of colon polyps   . Hyperlipidemia    takes Pravastatin daily  . Hypertension    takes Amlodipine and Lisinopril daily  . Hypothyroidism    takes synthroid and cytomel daily  . Insomnia    takes Xanax nightly  . Joint pain   . Pneumonia    hx of;last time about 5yrs ago  . PONV (postoperative nausea and vomiting)   . Shortness of breath    with exertion  . Weakness    left leg   Past Surgical History:  Procedure Laterality Date  . APPENDECTOMY  1974  . CESAREAN SECTION  1981  . COLONOSCOPY    . DILATION AND  CURETTAGE OF UTERUS  1979  . epidural injections    . EYE SURGERY     at age 87;vision correction  . JOINT REPLACEMENT  2010   right hip  . LUMBAR LAMINECTOMY/DECOMPRESSION MICRODISCECTOMY Left 03/18/2013   Procedure: LEFT LUMBAR FOUR-FIVE,LUMBAR FIVE-SACRAL ONE MICRODISCECTOMY;  Surgeon: Faythe Ghee, MD;  Location: [facility];  Service: Neurosurgery;  Laterality: Left;  left  . partial removal of right kidney  1971  . pituitary tumor removed  1983   Social History   Social History Narrative   Married, on Medicare prior to 76 question disabled   Rare red wine   Never smoker   No drug use   family history includes Allergic rhinitis in her  brother, father, mother, and sister; Asthma in her mother.   Review of Systems Multiple joint pains, she has had many joint replacements and she has back pain.  Asthma and this are all stable however no acute symptoms recently.

## 2018-12-02 ENCOUNTER — Telehealth: Payer: Self-pay | Admitting: Internal Medicine

## 2018-12-02 NOTE — Telephone Encounter (Signed)

## 2018-12-03 ENCOUNTER — Ambulatory Visit (AMBULATORY_SURGERY_CENTER): Payer: Medicare Other | Admitting: Internal Medicine

## 2018-12-03 ENCOUNTER — Encounter: Payer: Self-pay | Admitting: Internal Medicine

## 2018-12-03 ENCOUNTER — Other Ambulatory Visit: Payer: Self-pay

## 2018-12-03 VITALS — BP 154/87 | HR 81 | Temp 98.3°F | Resp 18 | Ht 65.0 in | Wt 271.0 lb

## 2018-12-03 DIAGNOSIS — K529 Noninfective gastroenteritis and colitis, unspecified: Secondary | ICD-10-CM | POA: Diagnosis not present

## 2018-12-03 DIAGNOSIS — K921 Melena: Secondary | ICD-10-CM

## 2018-12-03 DIAGNOSIS — K56600 Partial intestinal obstruction, unspecified as to cause: Secondary | ICD-10-CM

## 2018-12-03 DIAGNOSIS — D123 Benign neoplasm of transverse colon: Secondary | ICD-10-CM

## 2018-12-03 DIAGNOSIS — K635 Polyp of colon: Secondary | ICD-10-CM | POA: Diagnosis not present

## 2018-12-03 MED ORDER — SODIUM CHLORIDE 0.9 % IV SOLN: 500.0000 mL | Freq: Once | INTRAVENOUS | Status: DC

## 2018-12-03 NOTE — Telephone Encounter (Signed)
No to all answers. °

## 2018-12-03 NOTE — Progress Notes (Signed)
Called to room to assist during endoscopic procedure.  Patient ID and intended procedure confirmed with present staff. Received instructions for my participation in the procedure from the performing physician.  

## 2018-12-03 NOTE — Progress Notes (Signed)
To PACU, VSS. Report to [email or web address] 

## 2018-12-03 NOTE — Progress Notes (Signed)
Pt's states no medical or surgical changes since previsit or office visit. 

## 2018-12-03 NOTE — Op Note (Signed)
[place] Patient Name: Whitney Higgins Procedure Date: 12/03/2018 3:05 PM MRN: [id number] Endoscopist: Gatha Mayer , MD Age: 63 Referring MD:  Date of Birth: 03/31/1956 Gender: Female Account #: [id number] Procedure:                Colonoscopy Indications:              Chronic diarrhea, Hematochezia Medicines:                Propofol per Anesthesia, Monitored Anesthesia Care Procedure:                Pre-Anesthesia Assessment:                           - Prior to the procedure, a History and Physical                            was performed, and patient medications and                            allergies were reviewed. The patient's tolerance of                            previous anesthesia was also reviewed. The risks                            and benefits of the procedure and the sedation                            options and risks were discussed with the patient.                            All questions were answered, and informed consent                            was obtained. Prior Anticoagulants: The patient has                            taken no previous anticoagulant or antiplatelet                            agents. ASA Grade Assessment: III - A patient with                            severe systemic disease. After reviewing the risks                            and benefits, the patient was deemed in                            satisfactory condition to undergo the procedure.                           After obtaining informed consent, the colonoscope  was passed under direct vision. Throughout the                            procedure, the patient's blood pressure, pulse, and                            oxygen saturations were monitored continuously. The                            Colonoscope was introduced through the anus with                            the intention of advancing to the cecum. The scope   was advanced to the transverse colon before the                            procedure was aborted. Medications were given. The                            colonoscopy was performed with difficulty due to                            bowel stenosis, a redundant colon and significant                            looping. Successful completion of the procedure was                            aided by applying abdominal pressure. The patient                            tolerated the procedure well. The quality of the                            bowel preparation was good. The rectum was                            photographed. Scope In: 3:20:12 PM Scope Out: 3:40:56 PM Scope Withdrawal Time: 0 hours 8 minutes 21 seconds  Total Procedure Duration: 0 hours 20 minutes 44 seconds  Findings:                 The perianal and digital rectal examinations were                            normal.                           An area of mildly congested, erythematous, friable                            (with contact bleeding), granular and                            vascular-pattern-decreased mucosa was found in the  rectum. Biopsies were taken with a cold forceps for                            histology. Verification of patient identification                            for the specimen was done. Estimated blood loss was                            minimal.                           A localized area of severely altered vascular,                            congested, erythematous, eroded and friable (with                            contact bleeding) mucosa was found in the sigmoid                            colon. Biopsies were taken with a cold forceps for                            histology. Verification of patient identification                            for the specimen was done. Estimated blood loss was                            minimal.                           A benign-appearing,  intrinsic moderate stenosis                            measuring 5 cm (in length) was found in the sigmoid                            colon and was traversed.                           Three sessile polyps were found in the transverse                            colon. The polyps were diminutive in size. These                            polyps were removed with a cold snare. Resection                            and retrieval were complete. Verification of  patient identification for the specimen was done.                            Estimated blood loss was minimal.                           Multiple diverticula were found in the sigmoid                            colon.                           The exam was otherwise without abnormality on                            direct and retroflexion views. Complications:            No immediate complications. Estimated Blood Loss:     Estimated blood loss was minimal. Impression:               - Congested, erythematous, friable (with contact                            bleeding), granular and vascular-pattern-decreased                            mucosa in the rectum. Biopsied.                           - Altered vascular, congested, erythematous, eroded                            and friable (with contact bleeding) mucosa in the                            sigmoid colon. Biopsied.                           - Stricture in the sigmoid colon.                           - Three diminutive polyps in the transverse colon,                            removed with a cold snare. Resected and retrieved.                           - Diverticulosis in the sigmoid colon.                           - The examination was otherwise normal on direct                            and retroflexion views.                           Seems like she might have  Crohn's disease though                            SCAD or UC possible.                            I could not reach the ceum and given the stenosis                            and inflammation elected to stop (also redundant).                            Would consider abdominal binder in future. Recommendation:           - Patient has a contact number available for                            emergencies. The signs and symptoms of potential                            delayed complications were discussed with the                            patient. Return to normal activities tomorrow.                            Written discharge instructions were provided to the                            patient.                           - Resume previous diet.                           - Continue present medications.                           - Await pathology results.                           - Once I see biopsies will advise treatment.                           She should defer sleeve gastrectomy until further                            notice. Gatha Mayer, MD 12/03/2018 3:54:17 PM This report has been signed electronically.

## 2018-12-03 NOTE — Patient Instructions (Addendum)
There is inflammation in the rectum and left-side of the colon. I think this is causing your problems. This and some other issues dd not allow me to complete your colonoscopy.  We did get important information to understand your problems.  Once I get the biopsy results I will let you know.  I am thinking you have a form of inflammatory bowel disease - possibly Crohn's. I do not see anything that looks like cancer.  This is treatable - but you should not do the sleeve gastrectomy until we understand and treat this so you are better.  I also removed 3 tiny polyps.  I will let you know - hopefully this week but should be by next week.  I appreciate the opportunity to care for you. Gatha Mayer, MD, [facility]  Please read handouts provided. Continue present medications. Await pathology results.     YOU HAD AN ENDOSCOPIC PROCEDURE TODAY AT [facility]:   Refer to the procedure report that was given to you for any specific questions about what was found during the examination.  If the procedure report does not answer your questions, please call your gastroenterologist to clarify.  If you requested that your care partner not be given the details of your procedure findings, then the procedure report has been included in a sealed envelope for you to review at your convenience later.  YOU SHOULD EXPECT: Some feelings of bloating in the abdomen. Passage of more gas than usual.  Walking can help get rid of the air that was put into your GI tract during the procedure and reduce the bloating. If you had a lower endoscopy (such as a colonoscopy or flexible sigmoidoscopy) you may notice spotting of blood in your stool or on the toilet paper. If you underwent a bowel prep for your procedure, you may not have a normal bowel movement for a few days.  Please Note:  You might notice some irritation and congestion in your nose or some drainage.  This is from the oxygen used during your  procedure.  There is no need for concern and it should clear up in a day or so.  SYMPTOMS TO REPORT IMMEDIATELY:   Following lower endoscopy (colonoscopy or flexible sigmoidoscopy):  Excessive amounts of blood in the stool  Significant tenderness or worsening of abdominal pains  Swelling of the abdomen that is new, acute  Fever of 100F or higher    For urgent or emergent issues, a gastroenterologist can be reached at any hour by calling [phone].   DIET:  We do recommend a small meal at first, but then you may proceed to your regular diet.  Drink plenty of fluids but you should avoid alcoholic beverages for 24 hours.  ACTIVITY:  You should plan to take it easy for the rest of today and you should NOT DRIVE or use heavy machinery until tomorrow (because of the sedation medicines used during the test).    FOLLOW UP: Our staff will call the number listed on your records 48-72 hours following your procedure to check on you and address any questions or concerns that you may have regarding the information given to you following your procedure. If we do not reach you, we will leave a message.  We will attempt to reach you two times.  During this call, we will ask if you have developed any symptoms of COVID 19. If you develop any symptoms (ie: fever, flu-like symptoms, shortness of breath, cough etc.)  before then, please call [phone].  If you test positive for Covid 19 in the 2 weeks post procedure, please call and report this information to [place].    If any biopsies were taken you will be contacted by phone or by letter within the next 1-3 weeks.  Please call us at [phone] if you have not heard about the biopsies in 3 weeks.    SIGNATURES/CONFIDENTIALITY: You and/or your care partner have signed paperwork which will be entered into your electronic medical record.  These signatures attest to the fact that that the information above on your After Visit Summary has been reviewed and  is understood.  Full responsibility of the confidentiality of this discharge information lies with you and/or your care-partner.

## 2018-12-04 ENCOUNTER — Telehealth: Payer: Self-pay | Admitting: Internal Medicine

## 2018-12-04 MED ORDER — DICYCLOMINE HCL 20 MG PO TABS: 20.0000 mg | ORAL_TABLET | Freq: Four times a day (QID) | ORAL | 1 refills | Status: DC | PRN

## 2018-12-04 NOTE — Telephone Encounter (Signed)
Phoned pt and informed her that dicyclomine was sent to her pharmacy.  She will try this and call back if she does not have relief.

## 2018-12-04 NOTE — Telephone Encounter (Signed)
Returned pts call.  She states that she is having the same pain on the right side of her abdomen that she was having prior to her procedure. She is having no other symptoms.   She wants to know if its ok to take Naprosyn 500mg  for this pain given the results from her procedure yesterday.  Dr. Carlean Purl please advise.

## 2018-12-04 NOTE — Telephone Encounter (Signed)
Please tell her I sent in dicyclomine to see what that does  If it does not help I will send something else  She should let me know in 1-2 d if this helps  Avoid Aleve and NSAIDs

## 2018-12-05 ENCOUNTER — Telehealth: Payer: Self-pay

## 2018-12-05 NOTE — Telephone Encounter (Signed)
  Follow up Call-  Call back number 12/03/2018  Post procedure Call Back phone  # [phone]  Permission to leave phone message Yes  Some recent data might be hidden     Patient questions:  Do you have a fever, pain , or abdominal swelling? No. Pain Score  0 *  Have you tolerated food without any problems? Yes.    Have you been able to return to your normal activities? Yes.    Do you have any questions about your discharge instructions: Diet   No. Medications  No. Follow up visit  No.  Do you have questions or concerns about your Care? No.  Actions: * If pain score is 4 or above: No action needed, pain <4.  1. Have you developed a fever since your procedure? no  2.   Have you had an respiratory symptoms (SOB or cough) since your procedure? no  3.   Have you tested positive for COVID 19 since your procedure no  4.   Have you had any family members/close contacts diagnosed with the COVID 19 since your procedure?  no   If yes to any of these questions please route to Joylene John, RN and Alphonsa Gin, Therapist, sports.

## 2018-12-06 ENCOUNTER — Telehealth: Payer: Self-pay | Admitting: Internal Medicine

## 2018-12-06 ENCOUNTER — Other Ambulatory Visit: Payer: Self-pay

## 2018-12-06 MED ORDER — MESALAMINE 1.2 G PO TBEC: 2.4000 g | DELAYED_RELEASE_TABLET | Freq: Two times a day (BID) | ORAL | 3 refills | Status: DC

## 2018-12-06 NOTE — Telephone Encounter (Signed)
I called her pharmacy and they are going to fix up her 2 weeks worth of her mesalamine to try for $82.65. She is going to investigate drug plan choices to see which one might work better for her. She is suppose to pick a new one soon.

## 2018-12-06 NOTE — Progress Notes (Signed)
[place] 1 year colon recall  Office tell the patient that she has inflammatory bowel disease - Probably ulcerative colitis (vs Crohn's) I mentioned this to her  I want her to start Lialda 1.2 g take 2 bid (4 a day) # 360 3 RF - this (if it works) will be chronic Tx  If she does not start to see some improvement after 2 weeks call back and we can consider prednisone but would like to avoid that  RTC me 1 month  Do not do gastric sleeve until we get her better  Also polyps benign

## 2018-12-06 NOTE — Telephone Encounter (Signed)
Pt calling about path results. °

## 2018-12-06 NOTE — Telephone Encounter (Signed)
See path results for additional details.

## 2019-01-06 ENCOUNTER — Ambulatory Visit (INDEPENDENT_AMBULATORY_CARE_PROVIDER_SITE_OTHER): Payer: Medicare Other | Admitting: Internal Medicine

## 2019-01-06 ENCOUNTER — Encounter: Payer: Self-pay | Admitting: Internal Medicine

## 2019-01-06 VITALS — BP 130/70 | HR 104 | Temp 97.6°F | Ht 64.0 in | Wt 264.2 lb

## 2019-01-06 DIAGNOSIS — Z86010 Personal history of colonic polyps: Secondary | ICD-10-CM

## 2019-01-06 DIAGNOSIS — K5010 Crohn's disease of large intestine without complications: Secondary | ICD-10-CM

## 2019-01-06 DIAGNOSIS — K56699 Other intestinal obstruction unspecified as to partial versus complete obstruction: Secondary | ICD-10-CM

## 2019-01-06 DIAGNOSIS — K529 Noninfective gastroenteritis and colitis, unspecified: Secondary | ICD-10-CM | POA: Diagnosis not present

## 2019-01-06 HISTORY — DX: Noninfective gastroenteritis and colitis, unspecified: K52.9

## 2019-01-06 HISTORY — DX: Crohn's disease of large intestine without complications: K50.10

## 2019-01-06 MED ORDER — MESALAMINE 1.2 G PO TBEC: 2.4000 g | DELAYED_RELEASE_TABLET | Freq: Two times a day (BID) | ORAL | 1 refills | Status: DC

## 2019-01-06 NOTE — Assessment & Plan Note (Addendum)
Continue the mesalamine, 2.4 g twice daily.  May be able to de-escalate dose but at this point I would like to reassess the entire colon with a colonoscopy in October and then decide.  We will contact her when the October schedule is available.  In the meantime she will try getting the mesalamine from [place] which will save her substantial money.  I discussed the nature of this problem and how it is chronic and requires chronic therapy.  Need to review preventive healthcare like immunizations going forward consider vitamin D level previously, perhaps that was done as part of her bariatric work-up but she will be at risk for lower vitamin D if she has bariatric surgery

## 2019-01-06 NOTE — Progress Notes (Signed)
Whitney Higgins 63 y.o. [birth year] [id number]  Assessment & Plan:   IBD (inflammatory bowel disease) UC vs Crohn's Continue the mesalamine, 2.4 g twice daily.  May be able to de-escalate dose but at this point I would like to reassess the entire colon with a colonoscopy in October and then decide.  We will contact her when the October schedule is available.  In the meantime she will try getting the mesalamine from [place] which will save her substantial money.  I discussed the nature of this problem and how it is chronic and requires chronic therapy.  Need to review preventive healthcare like immunizations going forward consider vitamin D level previously, perhaps that was done as part of her bariatric work-up but she will be at risk for lower vitamin D if she has bariatric surgery  Hx of colonic polyps Will work out recall colonoscopy after reassessment of inflammation  Obesity, Class III, BMI 40-49.9 (morbid obesity) ([place]) Given inflammatory bowel disease not in remission or proven to be so, would hold off on any bariatric surgery  Stricture of sigmoid colon ([place]) Appears related to her IBD will reassess at colonoscopy in October after 2 to 3 months on therapy.     Subjective:   Chief Complaint: Follow-up of inflammatory bowel disease  HPI Whitney Higgins is here for follow-up to her colonoscopy about a month ago demonstrated inflammatory changes in the rectum and sigmoid consistent with chronic colitis, and a nonobstructive sigmoid stricture.  3 sessile serrated polyps were also seen and removed.  IBD of some sort Crohn's versus ulcerative colitis.  The locations would favor ulcerative colitis tickly since the rectum was involved but the disease was patchy which favors Crohn's.  She was started on mesalamine at 2.4 g twice daily and is much better though not 100% significantly better.  Unfortunately it is very expensive costing about $500 out of pocket for her for a 53-month supply.  Her sleeve  surgery for obesity is on hold.  She is still on a low fiber diet and would like to go back to more normal foods. Allergies  Allergen Reactions  . Codeine Nausea And Vomiting  . Clarithromycin Rash  . Penicillins Rash   Current Meds  Medication Sig  . acetaminophen (TYLENOL) 500 MG tablet Take 1,000 mg by mouth every 6 (six) hours as needed for pain.  Marland Kitchen albuterol (PROVENTIL HFA;VENTOLIN HFA) 108 (90 BASE) MCG/ACT inhaler Inhale 2 puffs into the lungs every 6 (six) hours as needed for wheezing.  Marland Kitchen ALPRAZolam (XANAX) 0.5 MG tablet Take 0.25 mg by mouth at bedtime as needed for sleep.  Marland Kitchen amLODipine (NORVASC) 5 MG tablet Take 5 mg by mouth daily.  Marland Kitchen atorvastatin (LIPITOR) 20 MG tablet Take 20 mg by mouth daily.  . cetirizine (ZYRTEC) 10 MG tablet Take 10 mg by mouth daily.  . cyanocobalamin 1000 MCG tablet Take 1,000 mcg by mouth daily.  Marland Kitchen dicyclomine (BENTYL) 20 MG tablet Take 1 tablet (20 mg total) by mouth every 6 (six) hours as needed (abdominal pain).  Marland Kitchen levothyroxine (SYNTHROID, LEVOTHROID) 88 MCG tablet Take 44 mcg by mouth daily before breakfast.  . liothyronine (CYTOMEL) 25 MCG tablet Take 25 mcg by mouth daily.  Marland Kitchen lisinopril-hydrochlorothiazide (PRINZIDE,ZESTORETIC) 20-25 MG per tablet Take 1 tablet by mouth daily.  . montelukast (SINGULAIR) 10 MG tablet Take 10 mg by mouth daily.  Marland Kitchen omeprazole (PRILOSEC) 20 MG capsule Take 20 mg by mouth as needed (for reflux).   . potassium chloride (KLOR-CON 10) 10  MEQ tablet TAKE 1 TABLET BY MOUTH EVERY DAY  . pravastatin (PRAVACHOL) 40 MG tablet Take 40 mg by mouth daily.  Marland Kitchen venlafaxine XR (EFFEXOR-XR) 150 MG 24 hr capsule Take 150 mg by mouth 2 (two) times daily.  Marland Kitchen VITAMIN D PO Take 2,000 Units by mouth daily.  . [DISCONTINUED] mesalamine (LIALDA) 1.2 g EC tablet Take 2 tablets (2.4 g total) by mouth 2 (two) times a day.   Past Medical History:  Diagnosis Date  . Anxiety    takes Xanax daily  . Arthritis   . Asthma    inhaler prn and  Singulair daily;OTC Zyrtec daily  . Chronic back pain    stenosis  . Depression    takes Effexor daily  . Diverticulosis   . Gallstones   . GERD (gastroesophageal reflux disease)    takes Omeprazole daily  . H/O: pituitary tumor   . History of blood transfusion    no abnormal reaction noted  . History of bronchitis    last time less than a yr ago  . History of colon polyps   . Hx of colonic polyps 01/10/2019  . Hyperlipidemia    takes Pravastatin daily  . Hypertension    takes Amlodipine and Lisinopril daily  . Hypothyroidism    takes synthroid and cytomel daily  . IBD (inflammatory bowel disease) UC vs Crohn's 01/06/2019  . Insomnia    takes Xanax nightly  . Joint pain   . Pneumonia    hx of;last time about 43yrs ago  . PONV (postoperative nausea and vomiting)   . Shortness of breath    with exertion  . Weakness    left leg   Past Surgical History:  Procedure Laterality Date  . APPENDECTOMY  1974  . CESAREAN SECTION  1981  . COLONOSCOPY    . DILATION AND CURETTAGE OF UTERUS  1979  . epidural injections    . EYE SURGERY     at age 1;vision correction  . JOINT REPLACEMENT  2010   right hip  . LUMBAR LAMINECTOMY/DECOMPRESSION MICRODISCECTOMY Left 03/18/2013   Procedure: LEFT LUMBAR FOUR-FIVE,LUMBAR FIVE-SACRAL ONE MICRODISCECTOMY;  Surgeon: Faythe Ghee, MD;  Location: [facility];  Service: Neurosurgery;  Laterality: Left;  left  . partial removal of right kidney  1971  . pituitary tumor removed  1983   Social History   Social History Narrative   Married, on Medicare prior to 41 question disabled   Rare red wine   Never smoker   No drug use   family history includes Allergic rhinitis in her brother, father, mother, and sister; Asthma in her mother.   Review of Systems As per HPI  Objective:   Physical Exam BP 130/70 (BP Location: Left Wrist, Patient Position: Sitting, Cuff Size: Normal)   Pulse (!) 104   Temp 97.6 F (36.4 C)   Ht 5\' 4"  (1.626 m)  Comment: height measured without shoes  Wt 264 lb 4 oz (119.9 kg)   BMI 45.36 kg/m  No acute distress Alert and oriented x3 Appropriate mood and affect Eyes anicteric

## 2019-01-06 NOTE — Patient Instructions (Addendum)
Glad you are better  As we discussed use [facility] online to get generic mesalamine.  We will contact you when October schedule is out to set up a colonoscopy.  You may stop Low Fiber Diet  I appreciate the opportunity to care for you. Gatha Mayer, MD, [facility]

## 2019-01-10 ENCOUNTER — Encounter: Payer: Self-pay | Admitting: Internal Medicine

## 2019-01-10 DIAGNOSIS — K56699 Other intestinal obstruction unspecified as to partial versus complete obstruction: Secondary | ICD-10-CM | POA: Insufficient documentation

## 2019-01-10 DIAGNOSIS — Z86010 Personal history of colonic polyps: Secondary | ICD-10-CM

## 2019-01-10 HISTORY — DX: Personal history of colonic polyps: Z86.010

## 2019-01-10 HISTORY — DX: Other intestinal obstruction unspecified as to partial versus complete obstruction: K56.699

## 2019-01-10 NOTE — Assessment & Plan Note (Addendum)
Appears related to her IBD will reassess at colonoscopy in October after 2 to 3 months on therapy.

## 2019-01-10 NOTE — Assessment & Plan Note (Signed)
Will work out recall colonoscopy after reassessment of inflammation

## 2019-01-10 NOTE — Assessment & Plan Note (Signed)
Given inflammatory bowel disease not in remission or proven to be so, would hold off on any bariatric surgery

## 2019-01-21 ENCOUNTER — Telehealth: Payer: Self-pay

## 2019-01-21 NOTE — Telephone Encounter (Signed)
-----   Message from Ryne Mctigue E [place], [place] sent at 01/06/2019 11:44 AM EDT ----- Set her up for an October colon-hx of polyps, no BT, not diabetic and set up a pre-visit appointment

## 2019-01-21 NOTE — Telephone Encounter (Signed)
I spoke with Whitney Higgins and set her up a pre-visit and colonoscopy appointment. She said her relative may come by here to drop off assistance papers to see about getting her mesalamine at less cost.

## 2019-01-21 NOTE — Telephone Encounter (Signed)
-----   Message from Meliza Kage [place], [place] sent at 01/06/2019 11:44 AM EDT ----- Set her up for an October colon-hx of polyps, no BT, not diabetic and set up a pre-visit appointment

## 2019-01-27 ENCOUNTER — Telehealth: Payer: Self-pay

## 2019-01-27 NOTE — Telephone Encounter (Signed)
Patient assistance papers for Lialda were faxed to [facility] [employer] # [phone], their phone # is [phone]. These were faxed on 01/24/2019. Will await response.

## 2019-01-29 NOTE — Telephone Encounter (Signed)
[employer] faxed over paper today saying that section 6 is incomplete. This is the patient's signature and date. I don't have her paperwork for this section. I have called her and left her a detailed message that this is missing. I left her their phone and fax #'s.

## 2019-01-30 NOTE — Telephone Encounter (Signed)
I spoke with Whitney Higgins and she is trying to get up with her niece that helped her do the forms so they can get this matter straightened out. She will call us back if she has more problems.

## 2019-02-17 NOTE — Telephone Encounter (Signed)
Left her a message to see if she has gotten this matter straightened out.

## 2019-02-25 ENCOUNTER — Ambulatory Visit (AMBULATORY_SURGERY_CENTER): Payer: Self-pay | Admitting: *Deleted

## 2019-02-25 ENCOUNTER — Other Ambulatory Visit: Payer: Self-pay

## 2019-02-25 VITALS — Temp 95.9°F | Ht 64.0 in | Wt 265.0 lb

## 2019-02-25 DIAGNOSIS — K529 Noninfective gastroenteritis and colitis, unspecified: Secondary | ICD-10-CM

## 2019-02-25 DIAGNOSIS — Z86010 Personal history of colonic polyps: Secondary | ICD-10-CM

## 2019-02-25 NOTE — Telephone Encounter (Signed)
Spoke with patient and she did get her Lialda. She has a question about the need for blood work being on this medicine to make sure her kidneys/liver/etc are ok. She said she doesn't see her PCP often. She is currently having UTI symptoms so I encouraged her to call them about getting a urine checked. I told her I'll call her back with an answer about a blood draw.

## 2019-02-25 NOTE — Telephone Encounter (Signed)
She had NL CBC and CMET in April I see via [place] check those about 1x a year and can discuss at f/u

## 2019-02-25 NOTE — Progress Notes (Signed)
No egg or soy allergy known to patient  PONV  issues with past sedation with any surgeries  or procedures, no intubation problems  No diet pills per patient No home 02 use per patient  No blood thinners per patient  Pt denies issues with constipation  No A fib or A flutter  EMMI video sent to pt's e mail  Suprep sample  LOT [phone] 05/22 We did the pre prep as last colon- 10-25 Sunday  late afternoon or early evening take 2 Dulcolax tablets and drink 4 doses of Miralax in 32 oz's of water to pre-prep.    Due to the COVID-19 pandemic we are asking patients to follow these guidelines. Please only bring one care partner. Please be aware that your care partner may wait in the car in the parking lot or if they feel like they will be too hot to wait in the car, they may wait in the lobby on the 4th floor. All care partners are required to wear a mask the entire time (we do not have any that we can provide them), they need to practice social distancing, and we will do a Covid check for all patient's and care partners when you arrive. Also we will check their temperature and your temperature. If the care partner waits in their car they need to stay in the parking lot the entire time and we will call them on their cell phone when the patient is ready for discharge so they can bring the car to the front of the building. Also all patient's will need to wear a mask into building.

## 2019-02-25 NOTE — Telephone Encounter (Signed)
Left her another message to call me back. 

## 2019-02-25 NOTE — Telephone Encounter (Signed)
I informed Allayna.

## 2019-03-08 ENCOUNTER — Telehealth: Payer: Self-pay | Admitting: Physician Assistant

## 2019-03-08 NOTE — Telephone Encounter (Signed)
03/08/19 1419   Patient called on call line this weekend and explained that she had just been diagnosed with a sinus and ear infection and started on antibiotics. Has h/o severe asthma and scheduled for colonoscopy on 10/27- with a two day prep. Discussed with the patient that it would probably be a better idea for [place] to reschedule her colonoscopy given her breathing difficulties and now an infection. Will forward to Dr. Carlean Purl for his info and have nursing staff call her on Monday to reschedule.  Ellouise Newer, PA-C

## 2019-03-10 NOTE — Telephone Encounter (Signed)
Agree with rescheduling 2-3 weeks down the road

## 2019-03-10 NOTE — Telephone Encounter (Signed)
Left message for patient to call back  

## 2019-03-10 NOTE — Telephone Encounter (Signed)
Pt returning your phone call

## 2019-03-11 ENCOUNTER — Encounter: Payer: Medicare Other | Admitting: Internal Medicine

## 2019-03-11 NOTE — Telephone Encounter (Signed)
Patient has been rescheduled.

## 2019-04-01 ENCOUNTER — Ambulatory Visit (INDEPENDENT_AMBULATORY_CARE_PROVIDER_SITE_OTHER): Payer: Medicare Other

## 2019-04-01 ENCOUNTER — Other Ambulatory Visit: Payer: Self-pay | Admitting: Internal Medicine

## 2019-04-01 DIAGNOSIS — Z1159 Encounter for screening for other viral diseases: Secondary | ICD-10-CM

## 2019-04-02 LAB — SARS CORONAVIRUS 2 (TAT 6-24 HRS): SARS Coronavirus 2: NEGATIVE

## 2019-04-03 ENCOUNTER — Other Ambulatory Visit: Payer: Self-pay

## 2019-04-03 ENCOUNTER — Other Ambulatory Visit (INDEPENDENT_AMBULATORY_CARE_PROVIDER_SITE_OTHER): Payer: Medicare Other

## 2019-04-03 ENCOUNTER — Ambulatory Visit (AMBULATORY_SURGERY_CENTER): Payer: Medicare Other | Admitting: Internal Medicine

## 2019-04-03 ENCOUNTER — Encounter: Payer: Self-pay | Admitting: Internal Medicine

## 2019-04-03 VITALS — BP 127/62 | HR 86 | Temp 98.1°F | Resp 17 | Ht 64.0 in | Wt 265.0 lb

## 2019-04-03 DIAGNOSIS — K635 Polyp of colon: Secondary | ICD-10-CM

## 2019-04-03 DIAGNOSIS — D123 Benign neoplasm of transverse colon: Secondary | ICD-10-CM

## 2019-04-03 DIAGNOSIS — K5730 Diverticulosis of large intestine without perforation or abscess without bleeding: Secondary | ICD-10-CM

## 2019-04-03 DIAGNOSIS — K529 Noninfective gastroenteritis and colitis, unspecified: Secondary | ICD-10-CM | POA: Diagnosis not present

## 2019-04-03 DIAGNOSIS — E559 Vitamin D deficiency, unspecified: Secondary | ICD-10-CM | POA: Diagnosis not present

## 2019-04-03 LAB — VITAMIN D 25 HYDROXY (VIT D DEFICIENCY, FRACTURES): VITD: 23.81 ng/mL — ABNORMAL LOW (ref 30.00–100.00)

## 2019-04-03 LAB — CBC WITH DIFFERENTIAL/PLATELET
Basophils Absolute: 0.1 10*3/uL (ref 0.0–0.1)
Basophils Relative: 0.5 % (ref 0.0–3.0)
Eosinophils Absolute: 0 10*3/uL (ref 0.0–0.7)
Eosinophils Relative: 0.3 % (ref 0.0–5.0)
HCT: 44.3 % (ref 36.0–46.0)
Hemoglobin: 14.5 g/dL (ref 12.0–15.0)
Lymphocytes Relative: 12.2 % (ref 12.0–46.0)
Lymphs Abs: 1.4 10*3/uL (ref 0.7–4.0)
MCHC: 32.7 g/dL (ref 30.0–36.0)
MCV: 89.3 fl (ref 78.0–100.0)
Monocytes Absolute: 0.8 10*3/uL (ref 0.1–1.0)
Monocytes Relative: 7 % (ref 3.0–12.0)
Neutro Abs: 8.9 10*3/uL — ABNORMAL HIGH (ref 1.4–7.7)
Neutrophils Relative %: 80 % — ABNORMAL HIGH (ref 43.0–77.0)
Platelets: 172 10*3/uL (ref 150.0–400.0)
RBC: 4.96 Mil/uL (ref 3.87–5.11)
RDW: 13.7 % (ref 11.5–15.5)
WBC: 11.1 10*3/uL — ABNORMAL HIGH (ref 4.0–10.5)

## 2019-04-03 LAB — COMPREHENSIVE METABOLIC PANEL
ALT: 27 U/L (ref 0–35)
AST: 26 U/L (ref 0–37)
Albumin: 3.8 g/dL (ref 3.5–5.2)
Alkaline Phosphatase: 150 U/L — ABNORMAL HIGH (ref 39–117)
BUN: 11 mg/dL (ref 6–23)
CO2: 28 mEq/L (ref 19–32)
Calcium: 8.8 mg/dL (ref 8.4–10.5)
Chloride: 101 mEq/L (ref 96–112)
Creatinine, Ser: 0.82 mg/dL (ref 0.40–1.20)
GFR: 69.97 mL/min (ref >60.00)
Glucose, Bld: 110 mg/dL — ABNORMAL HIGH (ref 70–99)
Potassium: 3.7 mEq/L (ref 3.5–5.1)
Sodium: 139 mEq/L (ref 135–145)
Total Bilirubin: 0.5 mg/dL (ref 0.2–1.2)
Total Protein: 7.3 g/dL (ref 6.0–8.3)

## 2019-04-03 MED ORDER — SODIUM CHLORIDE 0.9 % IV SOLN: 500.0000 mL | INTRAVENOUS | Status: DC

## 2019-04-03 NOTE — Progress Notes (Signed)
Temp LC V/s [place] I have reviewed the patient's medical history in detail and updated the computerized patient record.

## 2019-04-03 NOTE — Progress Notes (Signed)
A/ox3, pleased with MAC, report to RN 

## 2019-04-03 NOTE — Patient Instructions (Addendum)
PLEASE READ HANDOUT PROVIDED ON POLYPS.  Things look good today and a complete exam was accomplished.  No visible inflammation.  Biopsies taken.  2 tiny polyps removed.  Diverticulosis again.  I am also providing some nutrition info - I know and support that you are pursuing bariatric surgery but check out this information also.  Labs todaty CBC, CMET, vit D level   I think its ok to proceed with bariatric surgery when desired.  Healthy and nutritious eating and weight loss are made to be harder than they need to be. A simplified diet approach based around eating normally, as much as you want without restricting intake too much is better, I think. You must avoid packaged foods and try to eat real foods as much as possible. Packaged foods, sugary sodas, highly processed foods taste great but are slow poisons that lead to obesity and/or poor health.  It is very helpful to take some time each week and plan your meals. Work with your spouse, partner, family to do this as much as possible. Preparing meals ahead to take to work or school is especially helpful and will save money, too. You can do this and by working together it can take less time.  Some resources that I like are:  www.gaplesinstitute.org (Do the learning modules about healthy eating)  www.dietdoctor.com - helps with low carb diets and also can learn about and consider intermittent  fasting. If you have diabetes would not do intermittent fasting without checking with your doctor. Best to change what you eat before doing this.  Www.drberry.com   I recommend you check your blood sugar daily and keep a log if you are diabetic and check sugars.  Here are some guidelines to help you with meal planning -  Avoid all processed and packaged foods (bread, pasta, crackers, chips, etc) and beverages containing calories.  Avoid added sugars and excessive natural sugars.  Attention to how you feel if you consume artificial sweeteners.   Do they make you more hungry or raise your blood sugar?  With every meal and snack, aim to get 20 g of protein (3 ounces of meat, 4 ounces of fish, 3 eggs, protein powder, 1 cup [place] yogurt, 1 cup cottage cheese, etc.)  Increase fiber in the form of non-starchy vegetables.  These help you feel full with very little carbohydrates and are good for gut health.  Eat 1 serving healthy carb per meal- 1/2 cup brown rice, beans, potato, corn- pay attention to whether or not this significantly raises your blood sugar if you are checking blood sugars. If it does, reduce the frequency you consume these.   Eat 2-3 servings of lower sugar fruits daily.  This includes berries, apples, oranges, peaches, pears, one half banana.  Have small amounts of good fats such as avocado, nuts, olive oil, nut butters, olives.  Add a little cheese to your salads to make them tasty.     I appreciate the opportunity to care for you. Gatha Mayer, MD, FACG  YOU HAD AN ENDOSCOPIC PROCEDURE TODAY AT [facility]:   Refer to the procedure report that was given to you for any specific questions about what was found during the examination.  If the procedure report does not answer your questions, please call your gastroenterologist to clarify.  If you requested that your care partner not be given the details of your procedure findings, then the procedure report has been included in a sealed envelope for you to  review at your convenience later.  YOU SHOULD EXPECT: Some feelings of bloating in the abdomen. Passage of more gas than usual.  Walking can help get rid of the air that was put into your GI tract during the procedure and reduce the bloating. If you had a lower endoscopy (such as a colonoscopy or flexible sigmoidoscopy) you may notice spotting of blood in your stool or on the toilet paper. If you underwent a bowel prep for your procedure, you may not have a normal bowel movement for a few days.  Please  Note:  You might notice some irritation and congestion in your nose or some drainage.  This is from the oxygen used during your procedure.  There is no need for concern and it should clear up in a day or so.  SYMPTOMS TO REPORT IMMEDIATELY:   Following lower endoscopy (colonoscopy or flexible sigmoidoscopy):  Excessive amounts of blood in the stool  Significant tenderness or worsening of abdominal pains  Swelling of the abdomen that is new, acute  Fever of 100F or higher  For urgent or emergent issues, a gastroenterologist can be reached at any hour by calling [phone].   DIET:  We do recommend a small meal at first, but then you may proceed to your regular diet.  Drink plenty of fluids but you should avoid alcoholic beverages for 24 hours.  ACTIVITY:  You should plan to take it easy for the rest of today and you should NOT DRIVE or use heavy machinery until tomorrow (because of the sedation medicines used during the test).    FOLLOW UP: Our staff will call the number listed on your records 48-72 hours following your procedure to check on you and address any questions or concerns that you may have regarding the information given to you following your procedure. If we do not reach you, we will leave a message.  We will attempt to reach you two times.  During this call, we will ask if you have developed any symptoms of COVID 19. If you develop any symptoms (ie: fever, flu-like symptoms, shortness of breath, cough etc.) before then, please call [phone].  If you test positive for Covid 19 in the 2 weeks post procedure, please call and report this information to [place].    If any biopsies were taken you will be contacted by phone or by letter within the next 1-3 weeks.  Please call us at [phone] if you have not heard about the biopsies in 3 weeks.    SIGNATURES/CONFIDENTIALITY: You and/or your care partner have signed paperwork which will be entered into your electronic medical  record.  These signatures attest to the fact that that the information above on your After Visit Summary has been reviewed and is understood.  Full responsibility of the confidentiality of this discharge information lies with you and/or your care-partner.

## 2019-04-03 NOTE — Op Note (Signed)
[place] Patient Name: Whitney Higgins Procedure Date: 04/03/2019 2:15 PM MRN: [id number] Endoscopist: Gatha Mayer , MD Age: 63 Referring MD:  Date of Birth: 31-Dec-1955 Gender: Female Account #: [id number] Procedure:                Colonoscopy Indications:              Colitis, Follow-up of colitis Medicines:                Propofol per Anesthesia, Monitored Anesthesia Care Procedure:                Pre-Anesthesia Assessment:                           - Prior to the procedure, a History and Physical                            was performed, and patient medications and                            allergies were reviewed. The patient's tolerance of                            previous anesthesia was also reviewed. The risks                            and benefits of the procedure and the sedation                            options and risks were discussed with the patient.                            All questions were answered, and informed consent                            was obtained. Prior Anticoagulants: The patient has                            taken no previous anticoagulant or antiplatelet                            agents. ASA Grade Assessment: III - A patient with                            severe systemic disease. After reviewing the risks                            and benefits, the patient was deemed in                            satisfactory condition to undergo the procedure.                           After obtaining informed consent, the colonoscope  was passed under direct vision. Throughout the                            procedure, the patient's blood pressure, pulse, and                            oxygen saturations were monitored continuously. The                            Colonoscope was introduced through the anus and                            advanced to the the cecum, identified by                            appendiceal  orifice and ileocecal valve. The                            colonoscopy was performed with difficulty due to a                            redundant colon, significant looping and a tortuous                            colon. Successful completion of the procedure was                            aided by applying abdominal pressure and abdominal                            binder use. Scope In: 2:31:15 PM Scope Out: 2:54:27 PM Scope Withdrawal Time: 0 hours 11 minutes 21 seconds  Total Procedure Duration: 0 hours 23 minutes 12 seconds  Findings:                 The perianal and digital rectal examinations were                            normal.                           Two sessile polyps were found in the transverse                            colon. The polyps were 1 to 2 mm in size. These                            polyps were removed with a cold snare. Resection                            and retrieval were complete. Verification of                            patient identification for the specimen was done.  Estimated blood loss was minimal.                           Many small and large-mouthed diverticula were found                            in the sigmoid colon and descending colon. There                            was narrowing of the colon in association with the                            diverticular opening.                           The exam was otherwise without abnormality on                            direct and retroflexion views.                           Biopsies for histology were taken with a cold                            forceps from the ascending colon, transverse colon                            and descending colon for evaluation of microscopic                            colitis. Estimated blood loss was minimal.                            Verification of patient identification for the                            specimen was done.  Estimated blood loss was                            minimal. Biopsies were taken with a cold forceps in                            the sigmoid colon for histology. Verification of                            patient identification for the specimen was done.                            Estimated blood loss was minimal. Complications:            No immediate complications. Estimated Blood Loss:     Estimated blood loss was minimal. Impression:               - Two 1 to 2 mm polyps in the transverse colon,  removed with a cold snare. Resected and retrieved.                           - Severe diverticulosis in the sigmoid colon and in                            the descending colon. There was narrowing of the                            colon in association with the diverticular opening.                           - The examination was otherwise normal on direct                            and retroflexion views.                           - Biopsies were taken with a cold forceps from the                            ascending colon, transverse colon and descending                            colon for evaluation of microscopic colitis.                           - Biopsies were taken with a cold forceps for                            histology in the sigmoid colon.                           THE PREVIOUSLY SEEN COLITIS IN SIGMOID AND RECTUM                            LOOKS RESOLVED BIOPSIES TAKEN Recommendation:           - Patient has a contact number available for                            emergencies. The signs and symptoms of potential                            delayed complications were discussed with the                            patient. Return to normal activities tomorrow.                            Written discharge instructions were provided to the                            patient.                           -  Resume previous diet.                           -  Continue present medications.                           - Await pathology results.                           - Should be ok to pursue bariatric surgery now                           Continue mesalamine                           Will call with pathology results                           Labs today - CMET, CBC, vit D level (has hx alk                            phos elevation need to consider possibility of [place]) Gatha Mayer, MD 04/03/2019 3:07:42 PM This report has been signed electronically.

## 2019-04-07 ENCOUNTER — Telehealth: Payer: Self-pay | Admitting: *Deleted

## 2019-04-07 ENCOUNTER — Encounter: Payer: Self-pay | Admitting: Internal Medicine

## 2019-04-07 DIAGNOSIS — R748 Abnormal levels of other serum enzymes: Secondary | ICD-10-CM | POA: Insufficient documentation

## 2019-04-07 HISTORY — DX: Abnormal levels of other serum enzymes: R74.8

## 2019-04-07 NOTE — Progress Notes (Signed)
Let her know the following  1) Vitamin D is low - not terrible but it is and we want it higher especially before bariatric surgery  - take 2000 U daily OTC  2) alkaline phosphatase test remains elevated - could be from liver, bone or intestines - since she has fatty liver on an ultrasound I bet its that - when she has weight loss surgery if it goes down that will answer things - will plan to monitor over time   3) Other labs good  4) She will get a path letter and f/u recommendations after I see colon path results

## 2019-04-07 NOTE — Telephone Encounter (Signed)
  Follow up Call-  Call back number 04/03/2019 12/03/2018  Post procedure Call Back phone  # [phone]  Permission to leave phone message Yes Yes  Some recent data might be hidden     Patient questions:  Do you have a fever, pain , or abdominal swelling? No. Pain Score  0 *  Have you tolerated food without any problems? Yes.    Have you been able to return to your normal activities? Yes.    Do you have any questions about your discharge instructions: Diet   No. Medications  No. Follow up visit  No.  Do you have questions or concerns about your Care? No.  Actions: * If pain score is 4 or above: No action needed, pain <4.    1. Have you developed a fever since your procedure? no  2.   Have you had an respiratory symptoms (SOB or cough) since your procedure? no  3.   Have you tested positive for COVID 19 since your procedure no  4.   Have you had any family members/close contacts diagnosed with the COVID 19 since your procedure?  no   If yes to any of these questions please route to Joylene John, RN and Alphonsa Gin, Therapist, sports.

## 2019-04-14 ENCOUNTER — Encounter: Payer: Self-pay | Admitting: Internal Medicine

## 2019-04-14 NOTE — Progress Notes (Signed)
Called patient - bxs show a colitis still but sxs under control - continue mesalamine Looks like left-sided Crohn's most likely 2 diminutive transverse hyperplastic polyps  Needs to see me in office in 6 mos  Please place 2 recalls  1) Office visit May 2021 2) colonoscopy November 2025

## 2019-04-14 NOTE — Progress Notes (Signed)
CORRECTION recall colonoscopy 03/2022

## 2019-05-16 DIAGNOSIS — U071 COVID-19: Secondary | ICD-10-CM

## 2019-05-16 HISTORY — DX: COVID-19: U07.1

## 2019-05-31 ENCOUNTER — Telehealth: Payer: Self-pay | Admitting: Physician Assistant

## 2019-05-31 NOTE — Telephone Encounter (Signed)
Called to discuss with Heide Spark about Covid symptoms and the use of bamlanivimab, a monoclonal antibody infusion for those with mild to moderate Covid symptoms and at a high risk of hospitalization.      Pt is not qualified for this infusion due to need of oxygen. HH will bring her oxygen today. Symptoms reviewed that would warrant ED/Hospital evaluation as well should her condition worsen. Preventative practices reviewed. Patient verbalized understanding.   Patient Active Problem List   Diagnosis Date Noted  . Abnormal alkaline phosphatase test 04/07/2019  . Hx of colonic polyps 01/10/2019  . 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 Obesity, Class III, BMI 40-49.9 (morbid obesity) ([place]) 01/10/2019  . Crohn's colitis ([place]) - left-sided 01/06/2019  . Severe persistent asthma 06/14/2017  . Perennial allergic rhinitis with a predominant nonallergic component 06/14/2017  . Recurrent sinus infections 06/14/2017  . Closed nondisplaced fracture of lateral malleolus of right fibula 09/05/2016  . Dizzy spells 07/10/2016  . Coronary artery calcification 06/27/2016  . Idiopathic progressive neuropathy 09/09/2015  . Vitamin D deficiency 09/09/2015  . Chronic anxiety 05/26/2015  . Hallux valgus 05/26/2015  . Lumbar radiculopathy 05/26/2015  . Osteoarthritis 05/26/2015  . Recurrent major depressive disorder, in full remission ([place]) 05/26/2015  . Severe asthma with acute exacerbation 05/26/2015  . Spondylosis without myelopathy or radiculopathy, lumbar region 05/26/2015  . Arthritis of right acromioclavicular joint 05/14/2015  . Complete tear of right rotator cuff 05/14/2015  . Sprain and strain of other specified sites of shoulder and upper arm 05/14/2015  . Abnormal EKG 01/21/2015  . Menopausal and female climacteric states 12/21/2014  . Primary osteoarthritis of right shoulder 11/13/2014  . Acquired hypothyroidism 09/08/2014  . Asthma 09/08/2014  . Degeneration of lumbar  intervertebral disc 09/08/2014  . Environmental allergies 09/08/2014  . Essential hypertension 09/08/2014  . Hypercholesterolemia 09/08/2014  . Major depressive disorder 09/08/2014  . Migraine, unspecified, not intractable, without status migrainosus 09/08/2014  . Orthostatic hypotension 09/08/2014  . Prolactinoma ([place]) 09/08/2014  . Impingement syndrome of right shoulder 04/20/2014     Leanor Kail, PA - C

## 2019-11-10 ENCOUNTER — Other Ambulatory Visit (INDEPENDENT_AMBULATORY_CARE_PROVIDER_SITE_OTHER): Payer: Medicare Other

## 2019-11-10 ENCOUNTER — Ambulatory Visit (INDEPENDENT_AMBULATORY_CARE_PROVIDER_SITE_OTHER): Payer: Medicare Other | Admitting: Internal Medicine

## 2019-11-10 ENCOUNTER — Encounter: Payer: Self-pay | Admitting: Internal Medicine

## 2019-11-10 VITALS — BP 102/54 | HR 80 | Ht 64.0 in | Wt 274.0 lb

## 2019-11-10 DIAGNOSIS — R197 Diarrhea, unspecified: Secondary | ICD-10-CM

## 2019-11-10 DIAGNOSIS — K50111 Crohn's disease of large intestine with rectal bleeding: Secondary | ICD-10-CM | POA: Diagnosis not present

## 2019-11-10 DIAGNOSIS — K56699 Other intestinal obstruction unspecified as to partial versus complete obstruction: Secondary | ICD-10-CM

## 2019-11-10 LAB — COMPREHENSIVE METABOLIC PANEL
ALT: 24 U/L (ref 0–35)
AST: 23 U/L (ref 0–37)
Albumin: 4.2 g/dL (ref 3.5–5.2)
Alkaline Phosphatase: 149 U/L — ABNORMAL HIGH (ref 39–117)
BUN: 32 mg/dL — ABNORMAL HIGH (ref 6–23)
CO2: 33 mEq/L — ABNORMAL HIGH (ref 19–32)
Calcium: 10.2 mg/dL (ref 8.4–10.5)
Chloride: 99 mEq/L (ref 96–112)
Creatinine, Ser: 1.05 mg/dL (ref 0.40–1.20)
GFR: 52.51 mL/min — ABNORMAL LOW (ref >60.00)
Glucose, Bld: 112 mg/dL — ABNORMAL HIGH (ref 70–99)
Potassium: 3.6 mEq/L (ref 3.5–5.1)
Sodium: 140 mEq/L (ref 135–145)
Total Bilirubin: 0.4 mg/dL (ref 0.2–1.2)
Total Protein: 7.9 g/dL (ref 6.0–8.3)

## 2019-11-10 LAB — CBC WITH DIFFERENTIAL/PLATELET
Basophils Absolute: 0.1 10*3/uL (ref 0.0–0.1)
Basophils Relative: 1 % (ref 0.0–3.0)
Eosinophils Absolute: 0.1 10*3/uL (ref 0.0–0.7)
Eosinophils Relative: 1.7 % (ref 0.0–5.0)
HCT: 46.7 % — ABNORMAL HIGH (ref 36.0–46.0)
Hemoglobin: 15.5 g/dL — ABNORMAL HIGH (ref 12.0–15.0)
Lymphocytes Relative: 18.1 % (ref 12.0–46.0)
Lymphs Abs: 1.6 10*3/uL (ref 0.7–4.0)
MCHC: 33.1 g/dL (ref 30.0–36.0)
MCV: 88.6 fl (ref 78.0–100.0)
Monocytes Absolute: 0.8 10*3/uL (ref 0.1–1.0)
Monocytes Relative: 9.5 % (ref 3.0–12.0)
Neutro Abs: 6.2 10*3/uL (ref 1.4–7.7)
Neutrophils Relative %: 69.7 % (ref 43.0–77.0)
Platelets: 200 10*3/uL (ref 150.0–400.0)
RBC: 5.27 Mil/uL — ABNORMAL HIGH (ref 3.87–5.11)
RDW: 13.9 % (ref 11.5–15.5)
WBC: 8.9 10*3/uL (ref 4.0–10.5)

## 2019-11-10 LAB — C-REACTIVE PROTEIN: CRP: <1 mg/dL (ref 0.5–20.0)

## 2019-11-10 LAB — SEDIMENTATION RATE: Sed Rate: 7 mm/hr (ref 0–30)

## 2019-11-10 NOTE — Assessment & Plan Note (Signed)
It is possible this is becoming more symptomatic

## 2019-11-10 NOTE — Patient Instructions (Signed)
Your provider has requested that you go to the basement level for lab work before leaving today. Press "B" on the elevator. The lab is located at the first door on the left as you exit the elevator.   Normal BMI (Body Mass Index- based on height and weight) is between 23 and 30. Your BMI today is Body mass index is 47.03 kg/m. Marland Kitchen Please consider follow up  regarding your BMI with your Primary Care Provider.   You have been scheduled for a CT scan of the abdomen and pelvis at [facility] ([address]---this is in the same building as [employer]).   You are scheduled on 11/25/2019 at 9:30AM. You should arrive one and a half hours (8:AM) prior to your appointment time for registration. Please follow the written instructions below on the day of your exam:  WARNING: IF YOU ARE ALLERGIC TO IODINE/X-RAY DYE, PLEASE NOTIFY RADIOLOGY IMMEDIATELY AT [phone]! YOU WILL BE GIVEN A 13 HOUR PREMEDICATION PREP.  1) Do not eat or drink anything after 5:30AM (4 hours prior to your test)   If you have any questions regarding your exam or if you need to reschedule, you may call the CT department at [phone] between the hours of 8:00 am and 5:00 pm, Monday-Friday.  ________________________________________________________________________  I appreciate the opportunity to care for you. Silvano Rusk, MD, [facility]

## 2019-11-10 NOTE — Assessment & Plan Note (Signed)
This seems to be flaring.  Question if she could have more diffuse disease so we will investigate with a CT enterography.  CBC, sed rate, C-reactive protein and CMET will be undertaken as well.  We will hold off on change in therapy at this point until understand her situation better.

## 2019-11-10 NOTE — Progress Notes (Signed)
Whitney Higgins 64 y.o. 06-May-1956 [id number]  Assessment & Plan:   Encounter Diagnoses  Name Primary?  . Crohn's disease of colon with rectal bleeding ([place]) Yes  . Diarrhea, unspecified type   . Stricture of sigmoid colon ([place])     Crohn's colitis ([place]) - left-sided This seems to be flaring.  Question if she could have more diffuse disease so we will investigate with a CT enterography.  CBC, sed rate, C-reactive protein and CMET will be undertaken as well.  We will hold off on change in therapy at this point until understand her situation better.  Stricture of sigmoid colon [facility]) It is possible this is becoming more symptomatic      Subjective:   Chief Complaint: Abdominal pain loose stools  HPI  Whitney Higgins is here for follow-up complaining of some intermittent periumbilical abdominal pain looser stools with mucus and bright red blood mostly with wiping as far as the blood is concerned.  Up to 4 or more stools a day.  Recall that she has a history of a segmental colitis with granulomas on biopsy seen at colonoscopy in July 2020 and then a follow-up exam showed endoscopic improvement but persistent changes on biopsy in November 2020.  After that on 2.4 g [place] twice daily she really had done well until the last couple of months where she started to notice these problems.  She had Covid in January and may have had some antibiotics then but otherwise no antibiotics.  She is not disturbed in her sleep.  She continues to hold off on bariatric surgery until "I get this straightened out".  Red wine dairy products spicy foods are triggers.  She had used dicyclomine in the past but stopped it because she became dizzy and had some headaches and attributed to that.  She is tolerating the symptoms right now and in fact has a little trip to the beach planned.  She does not feel like a change in therapy is needed but she is concerned about the recurrent symptoms though they are not as severe as  what she has had in the past. Wt Readings from Last 3 Encounters:  11/10/19 274 lb (124.3 kg)  04/03/19 265 lb (120.2 kg)  02/25/19 265 lb (120.2 kg)   She has been vaccinated for Covid  She was placed on vitamin D by me last year but after Covid and with so many medication she has slacked off on taking it.  She has follow-up with her endocrinologist about that as well coming up. Allergies  Allergen Reactions  . Codeine Nausea And Vomiting  . Clarithromycin Rash  . Penicillins Rash   Current Meds  Medication Sig  . acetaminophen (TYLENOL) 500 MG tablet Take 1,000 mg by mouth every 6 (six) hours as needed for pain.  Marland Kitchen albuterol (PROVENTIL HFA;VENTOLIN HFA) 108 (90 BASE) MCG/ACT inhaler Inhale 2 puffs into the lungs every 6 (six) hours as needed for wheezing.  Marland Kitchen albuterol (PROVENTIL) (2.5 MG/3ML) 0.083% nebulizer solution Take 3 mLs (2.5 mg total) by nebulization 3 times daily.  Marland Kitchen ALPRAZolam (XANAX) 0.5 MG tablet Take 0.25 mg by mouth at bedtime as needed for sleep.  Marland Kitchen amLODipine (NORVASC) 5 MG tablet Take 5 mg by mouth daily.  Marland Kitchen atorvastatin (LIPITOR) 20 MG tablet Take 20 mg by mouth daily.  . cetirizine (ZYRTEC) 10 MG tablet Take 10 mg by mouth daily.  . clotrimazole (LOTRIMIN) 1 % cream APPLY TO AFFECTED AREA TWICE A DAY  . cyanocobalamin 1000 MCG  tablet Take 1,000 mcg by mouth daily.  Marland Kitchen dicyclomine (BENTYL) 20 MG tablet Take 1 tablet (20 mg total) by mouth every 6 (six) hours as needed (abdominal pain).  . fluticasone (FLONASE) 50 MCG/ACT nasal spray 2 sprays by Each Nare route daily.  Marland Kitchen levothyroxine (SYNTHROID) 75 MCG tablet Take 75 mcg by mouth daily.  Marland Kitchen liothyronine (CYTOMEL) 5 MCG tablet TAKE 1 TABLET TWICE A DAY 30 MINUTES BEFORE A MEAL  . lisinopril-hydrochlorothiazide (PRINZIDE,ZESTORETIC) 20-25 MG per tablet Take 1 tablet by mouth daily.  . mesalamine (LIALDA) 1.2 g EC tablet Take 2 tablets (2.4 g total) by mouth 2 (two) times daily with a meal.  . montelukast (SINGULAIR)  10 MG tablet Take 10 mg by mouth daily.  . naproxen (NAPROSYN) 500 MG tablet   . omeprazole (PRILOSEC) 20 MG capsule Take 20 mg by mouth as needed (for reflux).   . potassium chloride (KLOR-CON 10) 10 MEQ tablet TAKE 1 TABLET BY MOUTH EVERY DAY  . venlafaxine XR (EFFEXOR-XR) 150 MG 24 hr capsule Take 150 mg by mouth 2 (two) times daily.  Marland Kitchen VITAMIN D PO Take 2,000 Units by mouth daily.   Past Medical History:  Diagnosis Date  . Abnormal alkaline phosphatase test 04/07/2019  . Allergy   . Anxiety    takes Xanax daily  . Arthritis   . Asthma    inhaler prn and Singulair daily;OTC Zyrtec daily  . Chronic back pain    stenosis  . Crohn's colitis ([place]) - left-sided 01/06/2019  . Depression    takes Effexor daily  . Diverticulosis   . Gallstones   . GERD (gastroesophageal reflux disease)    takes Omeprazole daily  . H/O: pituitary tumor   . History of blood transfusion    no abnormal reaction noted  . History of bronchitis    last time less than a yr ago  . History of colon polyps   . Hx of colonic polyps 01/10/2019  . Hyperlipidemia    takes Pravastatin daily  . Hypertension    takes Amlodipine and Lisinopril daily  . Hypothyroidism    takes synthroid and cytomel daily  . IBD (inflammatory bowel disease) UC vs Crohn's 01/06/2019  . Insomnia    takes Xanax nightly  . Joint pain   . Pneumonia    hx of;last time about 11yrs ago  . PONV (postoperative nausea and vomiting)   . Shortness of breath    with exertion  . Weakness    left leg   Past Surgical History:  Procedure Laterality Date  . APPENDECTOMY  1974  . CESAREAN SECTION  1981  . CHOLECYSTECTOMY    . COLONOSCOPY    . DILATION AND CURETTAGE OF UTERUS  1979  . epidural injections    . EYE SURGERY     at age 11;vision correction  . JOINT REPLACEMENT  2010   right hip  . LUMBAR LAMINECTOMY/DECOMPRESSION MICRODISCECTOMY Left 03/18/2013   Procedure: LEFT LUMBAR FOUR-FIVE,LUMBAR FIVE-SACRAL ONE MICRODISCECTOMY;   Surgeon: Faythe Ghee, MD;  Location: [facility];  Service: Neurosurgery;  Laterality: Left;  left  . partial removal of right kidney  1971  . pituitary tumor removed  1983  . POLYPECTOMY    . SHOULDER SURGERY Right    Social History   Social History Narrative   Married, on Medicare prior to 5 question disabled   Rare red wine   Never smoker   No drug use   family history includes Allergic rhinitis  in her brother, father, mother, and sister; Asthma in her mother.   Review of Systems As above  Objective:   Physical Exam BP (!) 102/54   Pulse 80   Ht 5\' 4"  (1.626 m)   Wt 274 lb (124.3 kg)   BMI 47.03 kg/m  Morbidly obese white woman no acute distress Lungs are clear Heart sounds are normal The abdomen is obese with a well-healed surgical scar soft and nontender without organomegaly or mass

## 2019-11-25 ENCOUNTER — Other Ambulatory Visit: Payer: Self-pay

## 2019-11-25 ENCOUNTER — Ambulatory Visit (HOSPITAL_COMMUNITY)
Admission: RE | Admit: 2019-11-25 | Discharge: 2019-11-25 | Disposition: A | Discharge status: Home / Self Care | Payer: Medicare Other | Source: Ambulatory Visit | Attending: Internal Medicine | Admitting: Internal Medicine

## 2019-11-25 ENCOUNTER — Telehealth: Payer: Self-pay | Admitting: Internal Medicine

## 2019-11-25 DIAGNOSIS — R197 Diarrhea, unspecified: Secondary | ICD-10-CM | POA: Diagnosis present

## 2019-11-25 DIAGNOSIS — K50111 Crohn's disease of large intestine with rectal bleeding: Secondary | ICD-10-CM | POA: Insufficient documentation

## 2019-11-25 MED ORDER — IOHEXOL 300 MG/ML  SOLN
100.0000 mL | Freq: Once | INTRAMUSCULAR | Status: AC | PRN
  Administered 2019-11-25: 100 mL via INTRAVENOUS

## 2019-11-25 MED ORDER — BARIUM SULFATE 0.1 % PO SUSP
ORAL | Status: AC
  Filled 2019-11-25: qty 3

## 2019-11-25 MED ORDER — SODIUM CHLORIDE (PF) 0.9 % IJ SOLN
INTRAMUSCULAR | Status: AC
  Filled 2019-11-25: qty 50

## 2019-11-25 NOTE — Telephone Encounter (Signed)
See CT results for additional questions or concrns

## 2019-11-25 NOTE — Telephone Encounter (Signed)
Pt is requesting a call back regarding her CT scan results 

## 2020-03-01 ENCOUNTER — Telehealth: Payer: Self-pay | Admitting: Internal Medicine

## 2020-03-01 MED ORDER — MESALAMINE 1.2 G PO TBEC: 2.4000 g | DELAYED_RELEASE_TABLET | Freq: Two times a day (BID) | ORAL | 1 refills | Status: DC

## 2020-03-01 NOTE — Telephone Encounter (Signed)
Pt is requesting refill on her [place].  [address] [place], [place] [address] CB [phone] Fax [phone]

## 2020-03-01 NOTE — Telephone Encounter (Signed)
Lialda refilled as requested to [facility] in [place]. Was e-scribed.

## 2020-06-07 ENCOUNTER — Telehealth: Payer: Self-pay | Admitting: Internal Medicine

## 2020-06-07 NOTE — Telephone Encounter (Signed)
Inbound call from patient stating Whitney Higgins will be sending paperwork for Lialda medication to be filled out.  Patient is requesting to be contacted once it has been received so she can come and sign it as well please.

## 2020-06-07 NOTE — Telephone Encounter (Signed)
The form came and Whitney Higgins will try and come by tomorrow to do her part.

## 2020-06-08 NOTE — Telephone Encounter (Signed)
Toi came and did her part of the paperwork and Dr Carlean Purl did his part and I have faxed the paperwork to [facility] at fax # [phone].

## 2020-07-01 NOTE — Telephone Encounter (Signed)
I was unable to reach [facility] on the phone so I reached out to Whitney Higgins and she said she got notification that she has been approved for assistance. She was unsure of for how long.

## 2021-04-06 IMAGING — CT CT ENTEROGRAPHY (ABD-PELV W/ CM)
2 of 6 series · 16 of 46 positions shown, 18 images · IV contrast (APPLIED)
Comparison: None.

CLINICAL DATA: Crohn disease.  Rectal bleeding.

EXAM:
CT ABDOMEN AND PELVIS WITH CONTRAST (ENTEROGRAPHY)
TECHNIQUE: Multidetector CT of the abdomen and pelvis during bolus
administration of intravenous contrast. Negative oral contrast was
given.
CONTRAST:  100mL OMNIPAQUE IOHEXOL 300 MG/ML  SOLN

[Series 4: thins (person_name) · axial · 0.98mm/px · z∈[-672,-258]mm · 13 of 456 slices shown, 15 images]
[im 21/456  soft-tissue]
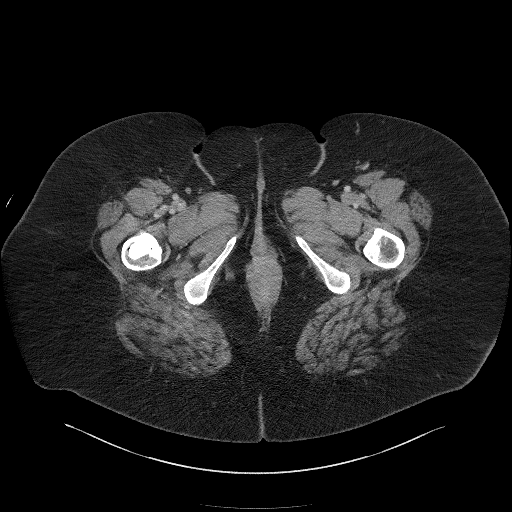
[im 21/456  bone]
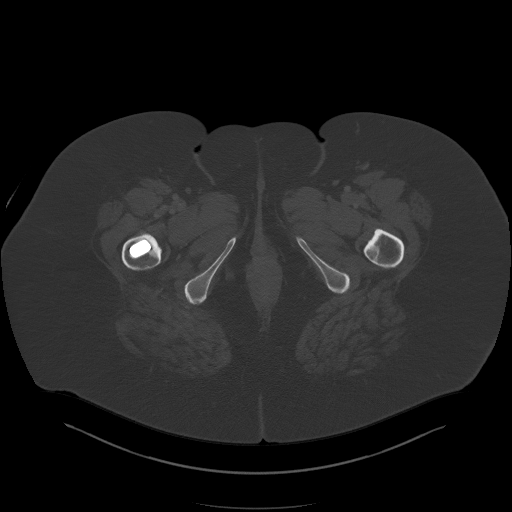
[im 63/456  soft-tissue]
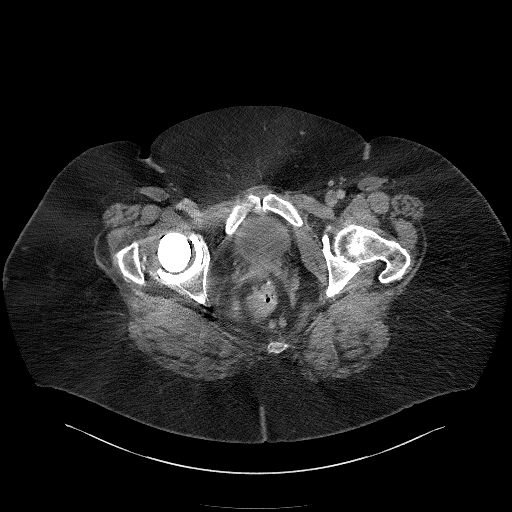
[im 104/456  soft-tissue]
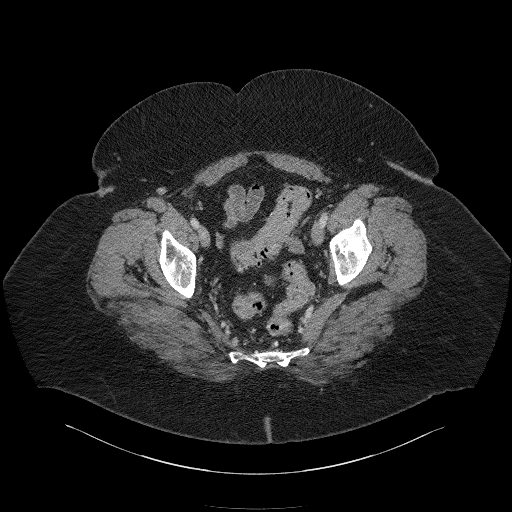
[im 125/456  soft-tissue]
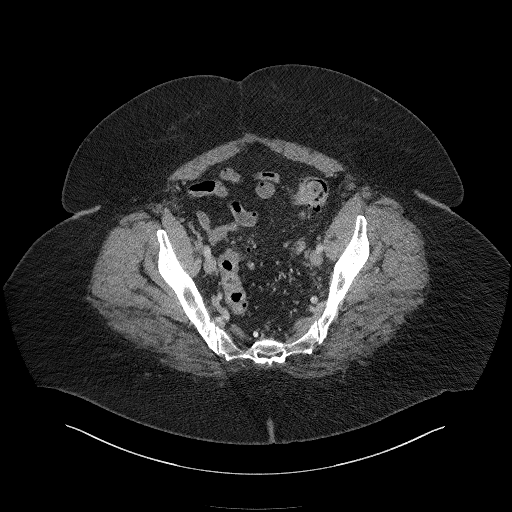
[im 166/456  soft-tissue]
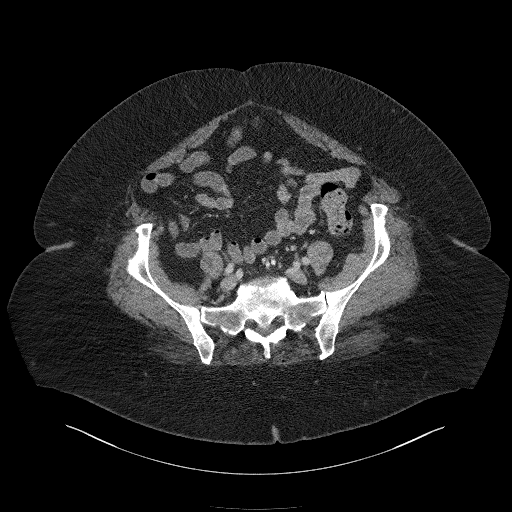
[im 187/456  soft-tissue]
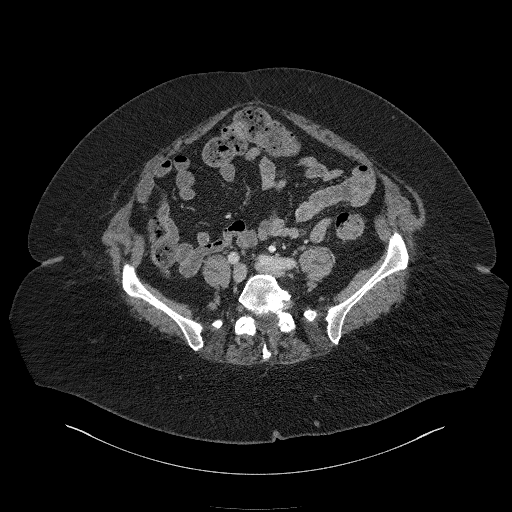
[im 228/456  soft-tissue]
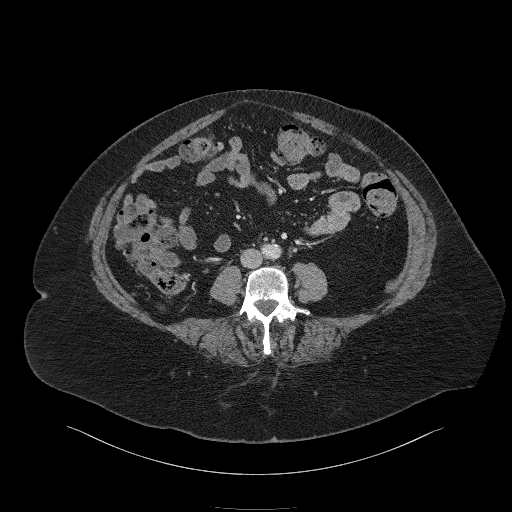
[im 269/456  soft-tissue]
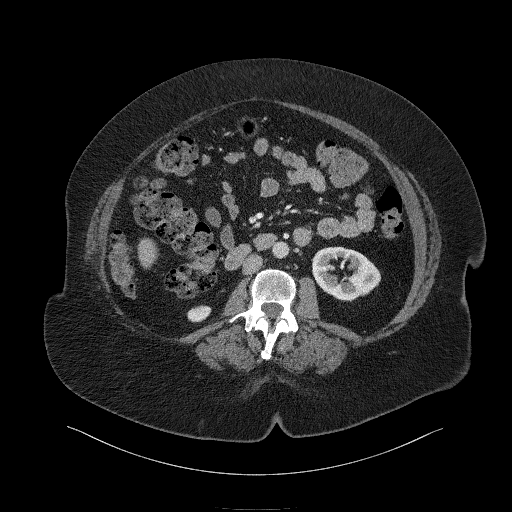
[im 290/456  soft-tissue]
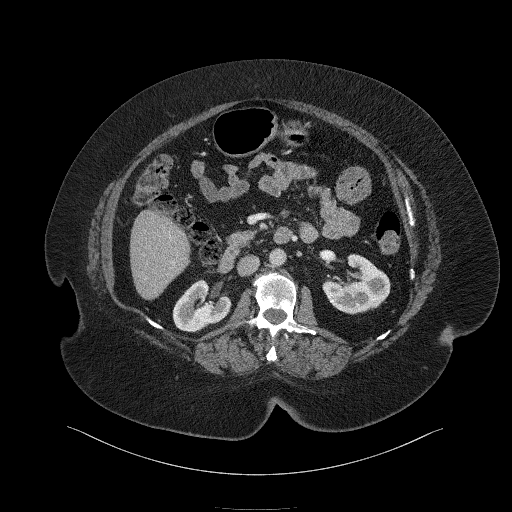
[im 290/456  bone]
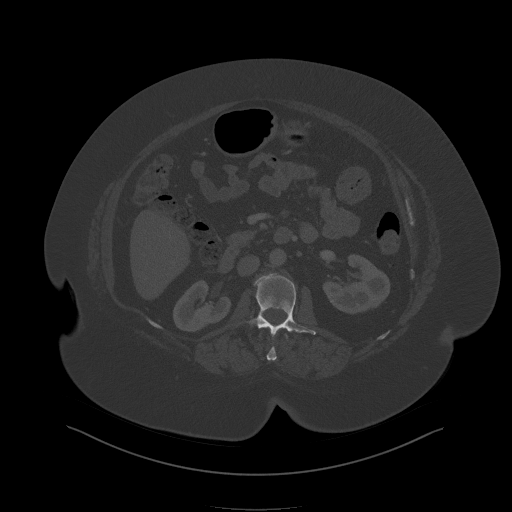
[im 331/456  soft-tissue]
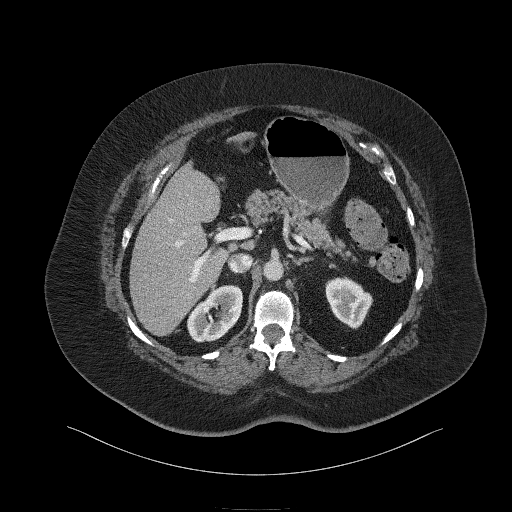
[im 352/456  soft-tissue]
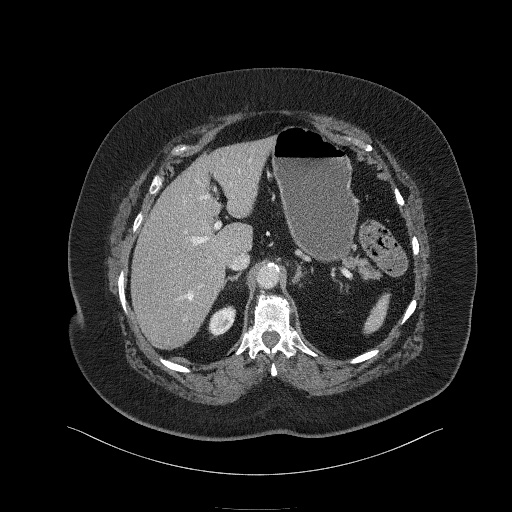
[im 393/456  soft-tissue]
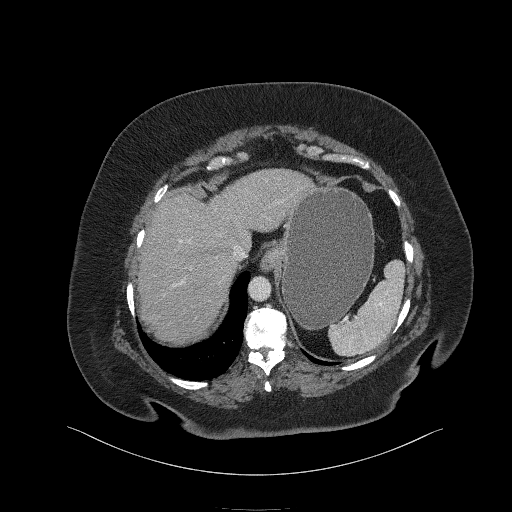
[im 435/456  soft-tissue]
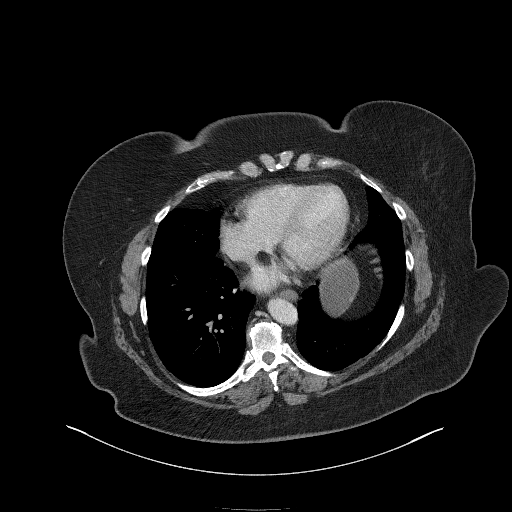

[Series 6: coronal · coronal · 0.77mm/px · 3 of 108 slices shown]
[im 36/108  soft-tissue]
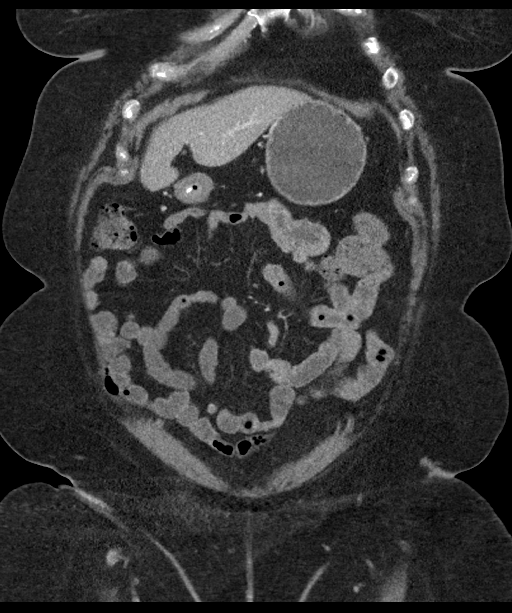
[im 48/108  soft-tissue]
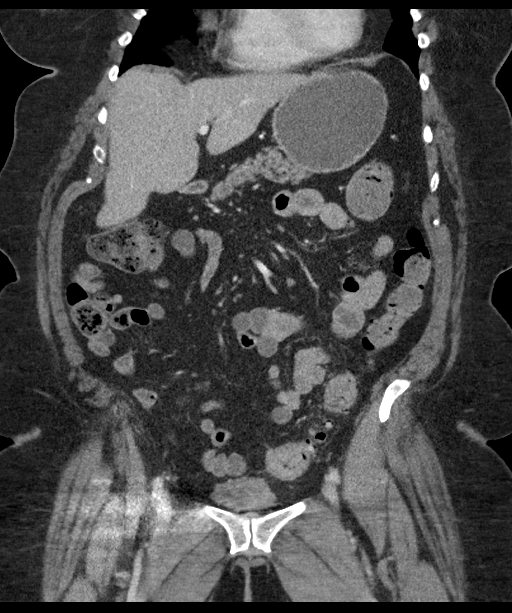
[im 60/108  soft-tissue]
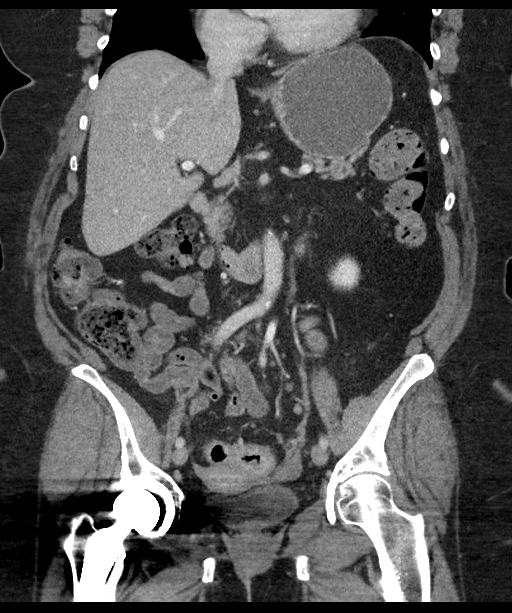

[16 of 46 positions shown; findings below may reference images not displayed]

FINDINGS: Lower chest:  Unremarkable.

Hepatobiliary: No suspicious focal abnormality within the liver
parenchyma. Gallbladder is surgically absent. No intrahepatic or
extrahepatic biliary dilation.

Pancreas: No focal mass lesion. No dilatation of the main duct. No
intraparenchymal cyst. No peripancreatic edema.

Spleen: No splenomegaly. No focal mass lesion.

Adrenals/Urinary Tract: No adrenal nodule or mass. Kidneys
unremarkable. No evidence for hydroureter. The urinary bladder
appears normal for the degree of distention.

Stomach/Bowel: Stomach is well distended without wall thickening or
abnormal mural enhancement. Duodenum is normally positioned as is
the ligament of Treitz. Jejunal loops are minimally distended but
show no wall thickening or abnormal mural enhancement. Ileal loops
are mildly distended without no abnormal wall thickening or
enhancement. There is no evidence of small-bowel inflammatory or
fibrous stricture. The terminal ileum is normal. The appendix is not
visualized, but there is no edema or inflammation in the region of
the cecum. No abnormal wall thickening or abnormal mural enhancement
in the colon. Diverticular changes are noted in the left colon
without diverticulitis.

Vascular/Lymphatic: There is abdominal aortic atherosclerosis
without aneurysm. There is no gastrohepatic or hepatoduodenal
ligament lymphadenopathy. No retroperitoneal or mesenteric
lymphadenopathy. No pelvic sidewall lymphadenopathy.

Reproductive: The uterus is unremarkable.  There is no adnexal mass.

Other: No intraperitoneal free fluid.

Musculoskeletal: Right total hip replacement. No worrisome lytic or
sclerotic osseous abnormality.
IMPRESSION: 1. No abnormal wall thickening or abnormal mural enhancement in the
stomach, small bowel, or colon to suggest active mucosal or
transmural inflammation. No evidence for small bowel stricture.
2. Left colonic diverticulosis without diverticulitis.
3. Aortic Atherosclerosis ([id number]-[id number].M).

## 2021-05-10 ENCOUNTER — Other Ambulatory Visit: Payer: Self-pay | Admitting: Internal Medicine

## 2021-08-04 ENCOUNTER — Telehealth: Payer: Self-pay

## 2021-08-04 NOTE — Telephone Encounter (Signed)
Paperwork has been signed and faxed to [employer]. Then we received a fax from them confirming they got it and are processing it. I called and left a voice mail message for Whitney Higgins to call and set up an appointment and to also let us know if she wants [place] to mail her a copy of the forms. ?

## 2021-08-04 NOTE — Telephone Encounter (Signed)
Whitney Higgins dropped off [name] at [name] today for her Lialda. I will work on this and have Dr Carlean Purl sign it and fax it to [phone].  ?

## 2021-08-11 NOTE — Telephone Encounter (Signed)
Forms will go out to her tomorrow in the mail. ?

## 2021-08-11 NOTE — Telephone Encounter (Signed)
I left Whitney Higgins a detailed message that her Lialda patient assistance has been approved thru 05/14/2022. I also told her that she needs to call for an appointment.  ?

## 2021-08-11 NOTE — Telephone Encounter (Signed)
Whitney Higgins called back and made an appointment for 09/08/2021 at 2:30pm. She requested I mail her the form back to her to have a copy for her records. I confirmed her address. ?

## 2021-09-08 ENCOUNTER — Encounter: Payer: Self-pay | Admitting: Internal Medicine

## 2021-09-08 ENCOUNTER — Ambulatory Visit (INDEPENDENT_AMBULATORY_CARE_PROVIDER_SITE_OTHER): Payer: Medicare Other | Admitting: Internal Medicine

## 2021-09-08 ENCOUNTER — Other Ambulatory Visit (INDEPENDENT_AMBULATORY_CARE_PROVIDER_SITE_OTHER): Payer: Medicare Other

## 2021-09-08 VITALS — BP 100/64 | HR 77 | Ht 64.0 in | Wt 287.1 lb

## 2021-09-08 DIAGNOSIS — Z86010 Personal history of colonic polyps: Secondary | ICD-10-CM | POA: Diagnosis not present

## 2021-09-08 DIAGNOSIS — K5010 Crohn's disease of large intestine without complications: Secondary | ICD-10-CM

## 2021-09-08 DIAGNOSIS — K1370 Unspecified lesions of oral mucosa: Secondary | ICD-10-CM | POA: Diagnosis not present

## 2021-09-08 LAB — COMPREHENSIVE METABOLIC PANEL
ALT: 17 U/L (ref 0–35)
AST: 21 U/L (ref 0–37)
Albumin: 4 g/dL (ref 3.5–5.2)
Alkaline Phosphatase: 142 U/L — ABNORMAL HIGH (ref 39–117)
BUN: 23 mg/dL (ref 6–23)
CO2: 31 mEq/L (ref 19–32)
Calcium: 9.3 mg/dL (ref 8.4–10.5)
Chloride: 99 mEq/L (ref 96–112)
Creatinine, Ser: 1.14 mg/dL (ref 0.40–1.20)
GFR: 49.85 mL/min — ABNORMAL LOW (ref >60.00)
Glucose, Bld: 111 mg/dL — ABNORMAL HIGH (ref 70–99)
Potassium: 3.6 mEq/L (ref 3.5–5.1)
Sodium: 138 mEq/L (ref 135–145)
Total Bilirubin: 0.4 mg/dL (ref 0.2–1.2)
Total Protein: 7.5 g/dL (ref 6.0–8.3)

## 2021-09-08 LAB — CBC WITH DIFFERENTIAL/PLATELET
Basophils Absolute: 0 10*3/uL (ref 0.0–0.1)
Basophils Relative: 0.9 % (ref 0.0–3.0)
Eosinophils Absolute: 0.1 10*3/uL (ref 0.0–0.7)
Eosinophils Relative: 1.7 % (ref 0.0–5.0)
HCT: 42.9 % (ref 36.0–46.0)
Hemoglobin: 14.5 g/dL (ref 12.0–15.0)
Lymphocytes Relative: 16.8 % (ref 12.0–46.0)
Lymphs Abs: 0.9 10*3/uL (ref 0.7–4.0)
MCHC: 33.8 g/dL (ref 30.0–36.0)
MCV: 87.6 fl (ref 78.0–100.0)
Monocytes Absolute: 0.5 10*3/uL (ref 0.1–1.0)
Monocytes Relative: 8.3 % (ref 3.0–12.0)
Neutro Abs: 4 10*3/uL (ref 1.4–7.7)
Neutrophils Relative %: 72.3 % (ref 43.0–77.0)
Platelets: 185 10*3/uL (ref 150.0–400.0)
RBC: 4.9 Mil/uL (ref 3.87–5.11)
RDW: 14.2 % (ref 11.5–15.5)
WBC: 5.5 10*3/uL (ref 4.0–10.5)

## 2021-09-08 NOTE — Patient Instructions (Signed)
Your provider has requested that you go to the basement level for lab work before leaving today. Press "B" on the elevator. The lab is located at the first door on the left as you exit the elevator. ? ?Due to recent changes in healthcare laws, you may see the results of your imaging and laboratory studies on MyChart before your provider has had a chance to review them.  We understand that in some cases there may be results that are confusing or concerning to you. Not all laboratory results come back in the same time frame and the provider may be waiting for multiple results in order to interpret others.  Please give [place] 48 hours in order for your provider to thoroughly review all the results before contacting the office for clarification of your results.  ? ?Please go to the dentist about your mouth lesion. ? ?We have you in our system for a November 2023 colon recall. ? ? ?I appreciate the opportunity to care for you. ?Silvano Rusk, MD, [facility] ?

## 2021-09-08 NOTE — Progress Notes (Signed)
? ?Whitney Higgins 66 y.o. Jan 22, 1956 [id number] ? ?Assessment & Plan:  ? ?Encounter Diagnoses  ?Name Primary?  ? Crohn's disease of colon without complication ([place]) Yes  ? Oral lesion   ? Hx of colonic polyps   ? ?Continue Lialda. ?CBC and CMET today.  She has had mild elevation of alk phos in the past.  She is certainly is at risk for fatty liver disease. ?See her dentist again regarding the oral lesion.  Seems to me that should be biopsied.  Defer to dentist/oral surgery ?Anticipate colonoscopy for polyp surveillance later this year or early next year (recall is November 2023).  BMI will determine location of service. ?CC: Dineen Kid, MD ? ? ? ?Subjective:  ? ?Chief Complaint: Crohn's follow-up ? ?HPI ?65 year old white woman with a history of left-sided Crohn's colitis, segmental disease, maintained on Lialda, also with a history of sessile serrated polyps.  She reports she is doing well unless she eats spicy or hot foods she will have some diarrhea problems.  Otherwise no significant abdominal pain, diarrhea or rectal bleeding.  Last seen in the office in June 2021.  She has no co-pay for Lialda through a financial assistance program. ? ?Other concerns are that of a hard white lesion in the right upper hard palate area.  She showed it to her dentist in December and she is worried about this.  He advised her that if it was still present on her 47-monthfollow-up he would biopsy it, I think from what she is telling me. ? ? ?Allergies  ?Allergen Reactions  ? Codeine Nausea And Vomiting  ? Clarithromycin Rash  ? Dicyclomine Hcl   ?  Dizzy, and headache  ? Penicillins Rash  ? ?Current Meds  ?Medication Sig  ? acetaminophen (TYLENOL) 500 MG tablet Take 1,000 mg by mouth every 6 (six) hours as needed for pain.  ? albuterol (PROVENTIL HFA;VENTOLIN HFA) 108 (90 BASE) MCG/ACT inhaler Inhale 2 puffs into the lungs every 6 (six) hours as needed for wheezing.  ? albuterol (PROVENTIL) (2.5 MG/3ML) 0.083% nebulizer  solution Take 3 mLs (2.5 mg total) by nebulization 3 times daily.  ? ALPRAZolam (XANAX) 0.5 MG tablet Take 0.25 mg by mouth at bedtime as needed for sleep.  ? amLODipine (NORVASC) 5 MG tablet Take 5 mg by mouth daily.  ? atorvastatin (LIPITOR) 20 MG tablet Take 20 mg by mouth daily.  ? cetirizine (ZYRTEC) 10 MG tablet Take 10 mg by mouth daily.  ? clotrimazole (LOTRIMIN) 1 % cream APPLY TO AFFECTED AREA TWICE A DAY  ? fluticasone (FLONASE) 50 MCG/ACT nasal spray 2 sprays by Each Nare route daily.  ? levothyroxine (SYNTHROID) 75 MCG tablet Take 75 mcg by mouth daily.  ? LIALDA 1.2 g EC tablet TAKE 2 TABLETS BY MOUTH TWICE DAILY WITH A MEAL  ? liothyronine (CYTOMEL) 5 MCG tablet TAKE 1 TABLET TWICE A DAY 30 MINUTES BEFORE A MEAL  ? lisinopril-hydrochlorothiazide (PRINZIDE,ZESTORETIC) 20-25 MG per tablet Take 1 tablet by mouth daily.  ? montelukast (SINGULAIR) 10 MG tablet Take 10 mg by mouth daily.  ? naproxen (NAPROSYN) 500 MG tablet Take 500 mg by mouth daily as needed.  ? omeprazole (PRILOSEC) 20 MG capsule Take 20 mg by mouth as needed (for reflux).   ? potassium chloride (KLOR-CON) 10 MEQ tablet TAKE 1 TABLET BY MOUTH EVERY DAY  ? venlafaxine XR (EFFEXOR-XR) 150 MG 24 hr capsule Take 150 mg by mouth 2 (two) times daily.  ? ?Past Medical History:  ?  Diagnosis Date  ? Abnormal alkaline phosphatase test 04/07/2019  ? Allergy   ? Anxiety   ? takes Xanax daily  ? Arthritis   ? Asthma   ? inhaler prn and Singulair daily;OTC Zyrtec daily  ? Chronic back pain   ? stenosis  ? COVID-19 05/2019  ? Crohn's colitis ([place]) - left-sided 01/06/2019  ? Depression   ? takes Effexor daily  ? Diverticulosis   ? Gallstones   ? GERD (gastroesophageal reflux disease)   ? takes Omeprazole daily  ? H/O: pituitary tumor   ? History of blood transfusion   ? no abnormal reaction noted  ? History of bronchitis   ? last time less than a yr ago  ? History of colon polyps   ? Hx of colonic polyps 01/10/2019  ? Hyperlipidemia   ? takes Pravastatin  daily  ? Hypertension   ? takes Amlodipine and Lisinopril daily  ? Hypothyroidism   ? takes synthroid and cytomel daily  ? IBD (inflammatory bowel disease) UC vs Crohn's 01/06/2019  ? Insomnia   ? takes Xanax nightly  ? Joint pain   ? Pneumonia   ? hx of;last time about 71yr ago  ? PONV (postoperative nausea and vomiting)   ? Shortness of breath   ? with exertion  ? Weakness   ? left leg  ? ?Past Surgical History:  ?Procedure Laterality Date  ? APPENDECTOMY  1974  ? [facility] ? CHOLECYSTECTOMY    ? COLONOSCOPY    ? DILATION AND CURETTAGE OF UTERUS  1979  ? epidural injections    ? EYE SURGERY    ? at age 89;visioncorrection  ? JOINT REPLACEMENT  2010  ? right hip  ? LUMBAR LAMINECTOMY/DECOMPRESSION MICRODISCECTOMY Left 03/18/2013  ? Procedure: LEFT LUMBAR FOUR-FIVE,LUMBAR FIVE-SACRAL ONE MICRODISCECTOMY;  Surgeon: RFaythe Ghee MD;  [facility];  Service: Neurosurgery;  Laterality: Left;  left  ? partial removal of right kidney  1971  ? pituitary tumor removed  1983  ? POLYPECTOMY    ? SHOULDER SURGERY Right   ? ?Social History  ? ?Social History Narrative  ? Married, on Medicare prior to 636question disabled  ? Rare red wine  ? Never smoker  ? No drug use  ? ?family history includes Allergic rhinitis in her brother, father, mother, and sister; Asthma in her mother. ? ? ?Review of Systems ?See HPI ? ?Objective:  ? Physical Exam ?BP 100/64   Pulse 77   Ht 5' 4"  (1.626 m)   Wt 287 lb 2 oz (130.2 kg)   SpO2 97%   BMI 49.28 kg/m?  ? ? ?Photo quality not perfect but there is a small white lesion on the hard palate that can be seen in the area of the molars-medial to.  This lesion is firm and will not scrape off with a Q-tip. ? ?Otherwise the patient is obese no acute distress ?Lungs are clear ?Heart sounds are normal ?Abdomen is very obese soft nontender without organomegaly or mass ?

## 2021-09-09 ENCOUNTER — Other Ambulatory Visit: Payer: Self-pay

## 2021-09-09 ENCOUNTER — Other Ambulatory Visit (INDEPENDENT_AMBULATORY_CARE_PROVIDER_SITE_OTHER): Payer: Medicare Other

## 2021-09-09 DIAGNOSIS — K5010 Crohn's disease of large intestine without complications: Secondary | ICD-10-CM

## 2021-09-09 LAB — GAMMA GT: GGT: 27 U/L (ref 7–51)

## 2021-09-13 LAB — NUCLEOTIDASE, 5', BLOOD: 5-Nucleotidase: 5 U/L (ref 0–10)

## 2022-04-10 ENCOUNTER — Encounter: Payer: Self-pay | Admitting: Internal Medicine

## 2022-04-11 LAB — LAB REPORT - SCANNED: EGFR (Non-African Amer.): 63

## 2022-04-27 ENCOUNTER — Other Ambulatory Visit: Payer: Self-pay | Admitting: Internal Medicine

## 2022-05-02 ENCOUNTER — Ambulatory Visit (AMBULATORY_SURGERY_CENTER): Payer: Medicare Other

## 2022-05-02 VITALS — Ht 65.0 in | Wt 270.0 lb

## 2022-05-02 DIAGNOSIS — K5010 Crohn's disease of large intestine without complications: Secondary | ICD-10-CM

## 2022-05-02 MED ORDER — NA SULFATE-K SULFATE-MG SULF 17.5-3.13-1.6 GM/177ML PO SOLN: 1.0000 | Freq: Once | ORAL | 0 refills | Status: AC

## 2022-05-02 NOTE — Progress Notes (Signed)
No egg or soy allergy known to patient  No issues known to pt with past sedation with any surgeries or procedures Patient denies ever being told they had issues or difficulty with intubation  No FH of Malignant Hyperthermia Pt is not on diet pills Pt is not on  home 02  Pt is not on blood thinners  Pt denies issues with constipation  No A fib or A flutter Have any cardiac testing pending--no Pt instructed to use [employer] or GoodRx for a price reduction on prep  Patient's chart reviewed by Osvaldo Angst CNRA prior to previsit and patient appropriate for the [place].  Previsit completed and red dot placed by patient's name on their procedure day (on provider's schedule).

## 2022-05-23 ENCOUNTER — Telehealth: Payer: Self-pay | Admitting: Gastroenterology

## 2022-05-23 ENCOUNTER — Encounter: Payer: Medicare Other | Admitting: Internal Medicine

## 2022-05-23 ENCOUNTER — Other Ambulatory Visit: Payer: Self-pay

## 2022-05-23 DIAGNOSIS — R112 Nausea with vomiting, unspecified: Secondary | ICD-10-CM

## 2022-05-23 MED ORDER — METOCLOPRAMIDE HCL 10 MG PO TABS: 10.0000 mg | ORAL_TABLET | Freq: Every day | ORAL | 0 refills | Status: DC

## 2022-05-23 NOTE — Telephone Encounter (Signed)
Please reschedule with Miralax prep   Have her take Reglan 10 mg 1 hour prior to each round of the prep so rx that disp # 2

## 2022-05-23 NOTE — Telephone Encounter (Signed)
Scheduled for colonoscopy 1/9. Vomited prep immediately last evening and contacted [place]. Advised change to Miralax/Gatorade prep which she did. She vomited some of the Miralax prep however was able to hold down some. She developed chills and vomiting while drinking prep this morning. Her stools are currently brown, very cloudy. She does not feel she was able to retain enough of the prep. Prep is not adequate and she wants to cancel her procedure today which is reasonable. Advised her to rest at home, resume her regular diet and I will forward to Dr. Carlean Purl for further advice and plans.

## 2022-05-23 NOTE — Telephone Encounter (Addendum)
Pt made aware of Dr. Carlean Purl recommendations: Prescription sent to pharmacy: Pt made aware Pt stated that she wanted to call us back to reschedule: Pt verbalized understanding with all questions answered.

## 2022-05-26 ENCOUNTER — Other Ambulatory Visit (HOSPITAL_COMMUNITY): Payer: Self-pay | Admitting: Orthopedic Surgery

## 2022-05-26 ENCOUNTER — Other Ambulatory Visit: Payer: Self-pay | Admitting: Orthopedic Surgery

## 2022-05-26 DIAGNOSIS — S73191A Other sprain of right hip, initial encounter: Secondary | ICD-10-CM

## 2022-05-26 DIAGNOSIS — S83242A Other tear of medial meniscus, current injury, left knee, initial encounter: Secondary | ICD-10-CM

## 2022-05-26 DIAGNOSIS — M1611 Unilateral primary osteoarthritis, right hip: Secondary | ICD-10-CM

## 2022-05-26 DIAGNOSIS — M5416 Radiculopathy, lumbar region: Secondary | ICD-10-CM

## 2022-06-01 ENCOUNTER — Encounter (HOSPITAL_COMMUNITY): Payer: Medicare Other

## 2022-06-08 ENCOUNTER — Other Ambulatory Visit: Payer: Medicare Other

## 2022-06-09 ENCOUNTER — Ambulatory Visit (HOSPITAL_COMMUNITY)
Admission: RE | Admit: 2022-06-09 | Discharge: 2022-06-09 | Disposition: A | Discharge status: Home / Self Care | Payer: Medicare Other | Source: Ambulatory Visit | Attending: Orthopedic Surgery | Admitting: Orthopedic Surgery

## 2022-06-09 DIAGNOSIS — M1611 Unilateral primary osteoarthritis, right hip: Secondary | ICD-10-CM | POA: Diagnosis present

## 2022-06-09 DIAGNOSIS — S73191A Other sprain of right hip, initial encounter: Secondary | ICD-10-CM | POA: Insufficient documentation

## 2022-06-09 MED ORDER — TECHNETIUM TC 99M MEDRONATE IV KIT
19.5000 | PACK | Freq: Once | INTRAVENOUS | Status: AC
  Administered 2022-06-09: 19.5 via INTRAVENOUS

## 2022-06-21 ENCOUNTER — Ambulatory Visit
Admission: RE | Admit: 2022-06-21 | Discharge: 2022-06-21 | Disposition: A | Discharge status: Home / Self Care | Payer: Medicare Other | Source: Ambulatory Visit | Attending: Orthopedic Surgery | Admitting: Orthopedic Surgery

## 2022-06-21 DIAGNOSIS — M5416 Radiculopathy, lumbar region: Secondary | ICD-10-CM

## 2022-06-21 DIAGNOSIS — S83242A Other tear of medial meniscus, current injury, left knee, initial encounter: Secondary | ICD-10-CM

## 2022-08-02 ENCOUNTER — Telehealth: Payer: Self-pay | Admitting: Internal Medicine

## 2022-08-02 NOTE — Telephone Encounter (Signed)
Inbound call from patient stating she is experiencing abdominal pain. Please advise.  Thank you

## 2022-08-02 NOTE — Telephone Encounter (Signed)
Pt stated that she has been having abdominal pain for over a month now. Pain located in lower abdomen that moves around left right mid; not consistent in 1 area.  Pt stated that she had a GI bleed associated with cramping several weeks ago. Dark clots coming from rectum for 4 days. Pt stated that GI bleed has resolved. Pt states that's she feels that she  does not empty her bowels completely when having a BM. N/V every couple days. Pt was scheduled for an office visit 08/03/2022 at 9:00 with Carl Best NP. Pt made aware.  Pt verbalized understanding with all questions answered.

## 2022-08-03 ENCOUNTER — Ambulatory Visit: Payer: Medicare Other | Admitting: Nurse Practitioner

## 2022-08-03 NOTE — Progress Notes (Deleted)
     08/03/2022 Whitney Higgins [id number] 1955/06/14   Chief Complaint: Nausea, abdominal pain   History of Present Illness: Whitney Higgins is a 67 year old female with a past medical history of colon polys and Crohn's disease. She is known by Dr. Carlean Purl. She remains on Lialda     Latest Ref Rng & Units 09/08/2021    3:11 PM 11/10/2019    2:55 PM 04/03/2019    3:44 PM  CBC  WBC 4.0 - 10.5 K/uL 5.5  8.9  11.1   Hemoglobin 12.0 - 15.0 g/dL 14.5  15.5  14.5   Hematocrit 36.0 - 46.0 % 42.9  46.7  44.3   Platelets 150.0 - 400.0 K/uL 185.0  200.0  172.0        Latest Ref Rng & Units 09/08/2021    3:11 PM 11/10/2019    2:55 PM 04/03/2019    3:44 PM  CMP  Glucose 70 - 99 mg/dL 111  112  110   BUN 6 - 23 mg/dL 23  32  11   Creatinine 0.40 - 1.20 mg/dL 1.14  1.05  0.82   Sodium 135 - 145 mEq/L 138  140  139   Potassium 3.5 - 5.1 mEq/L 3.6  3.6  3.7   Chloride 96 - 112 mEq/L 99  99  101   CO2 19 - 32 mEq/L 31  33  28   Calcium 8.4 - 10.5 mg/dL 9.3  10.2  8.8   Total Protein 6.0 - 8.3 g/dL 7.5  7.9  7.3   Total Bilirubin 0.2 - 1.2 mg/dL 0.4  0.4  0.5   Alkaline Phos 39 - 117 U/L 142  149  150   AST 0 - 37 U/L 21  23  26    ALT 0 - 35 U/L 17  24  27        Current Medications, Allergies, Past Medical History, Past Surgical History, Family History and Social History were reviewed in [employer] record.   Review of Systems:   Constitutional: Negative for fever, sweats, chills or weight loss.  Respiratory: Negative for shortness of breath.   Cardiovascular: Negative for chest pain, palpitations and leg swelling.  Gastrointestinal: See HPI.  Musculoskeletal: Negative for back pain or muscle aches.  Neurological: Negative for dizziness, headaches or paresthesias.    Physical Exam: There were no vitals taken for this visit. General: in no acute distress. Head: Normocephalic and atraumatic. Eyes: No scleral icterus. Conjunctiva pink . Ears: Normal auditory  acuity. Mouth: Dentition intact. No ulcers or lesions.  Lungs: Clear throughout to auscultation. Heart: Regular rate and rhythm, no murmur. Abdomen: Soft, nontender and nondistended. No masses or hepatomegaly. Normal bowel sounds x 4 quadrants.  Rectal: *** Musculoskeletal: Symmetrical with no gross deformities. Extremities: No edema. Neurological: Alert oriented x 4. No focal deficits.  Psychological: Alert and cooperative. Normal mood and affect  Assessment and Recommendations: ***

## 2022-08-07 ENCOUNTER — Telehealth: Payer: Self-pay | Admitting: Internal Medicine

## 2022-08-07 NOTE — Telephone Encounter (Signed)
Patient called saying she has a fever, chills, abdominal pain, dehydrated, bloody stools, and throwing up. Please advise.

## 2022-08-08 NOTE — Telephone Encounter (Signed)
Pt stated that she is still having ongoing issues that have been going on for a month now. Pt stated that today she feels that her fever/chills are better, tolerating Gatorade. Last Emesis yesterday, Last BM yesterday, still having abdominal pain. Chart reviewed. Pt had a same day cancellation on 08/03/2022 with Carl Best NP. Pt was scheduled to see Dr. Carlean Purl on 08/15/2022 at 2:50 PM. Pt made aware. Pt verbalized understanding with all questions answered.

## 2022-08-15 ENCOUNTER — Encounter: Payer: Self-pay | Admitting: Internal Medicine

## 2022-08-15 ENCOUNTER — Ambulatory Visit (INDEPENDENT_AMBULATORY_CARE_PROVIDER_SITE_OTHER): Payer: Medicare Other | Admitting: Internal Medicine

## 2022-08-15 ENCOUNTER — Other Ambulatory Visit (INDEPENDENT_AMBULATORY_CARE_PROVIDER_SITE_OTHER): Payer: Medicare Other

## 2022-08-15 VITALS — BP 110/60 | HR 110 | Temp 97.9°F | Ht 64.0 in | Wt 270.1 lb

## 2022-08-15 DIAGNOSIS — R112 Nausea with vomiting, unspecified: Secondary | ICD-10-CM

## 2022-08-15 DIAGNOSIS — K50111 Crohn's disease of large intestine with rectal bleeding: Secondary | ICD-10-CM

## 2022-08-15 DIAGNOSIS — R1084 Generalized abdominal pain: Secondary | ICD-10-CM

## 2022-08-15 DIAGNOSIS — K921 Melena: Secondary | ICD-10-CM | POA: Diagnosis not present

## 2022-08-15 LAB — COMPREHENSIVE METABOLIC PANEL
ALT: 55 U/L — ABNORMAL HIGH (ref 0–35)
AST: 45 U/L — ABNORMAL HIGH (ref 0–37)
Albumin: 3.5 g/dL (ref 3.5–5.2)
Alkaline Phosphatase: 143 U/L — ABNORMAL HIGH (ref 39–117)
BUN: 23 mg/dL (ref 6–23)
CO2: 26 mEq/L (ref 19–32)
Calcium: 9.9 mg/dL (ref 8.4–10.5)
Chloride: 97 mEq/L (ref 96–112)
Creatinine, Ser: 0.99 mg/dL (ref 0.40–1.20)
GFR: 58.66 mL/min — ABNORMAL LOW (ref >60.00)
Glucose, Bld: 110 mg/dL — ABNORMAL HIGH (ref 70–99)
Potassium: 3.7 mEq/L (ref 3.5–5.1)
Sodium: 133 mEq/L — ABNORMAL LOW (ref 135–145)
Total Bilirubin: 0.4 mg/dL (ref 0.2–1.2)
Total Protein: 8 g/dL (ref 6.0–8.3)

## 2022-08-15 LAB — LIPASE: Lipase: 24 U/L (ref 11.0–59.0)

## 2022-08-15 LAB — CBC WITH DIFFERENTIAL/PLATELET
Basophils Absolute: 0.1 10*3/uL (ref 0.0–0.1)
Basophils Relative: 0.4 % (ref 0.0–3.0)
Eosinophils Absolute: 0.2 10*3/uL (ref 0.0–0.7)
Eosinophils Relative: 1.2 % (ref 0.0–5.0)
HCT: 36.6 % (ref 36.0–46.0)
Hemoglobin: 12.1 g/dL (ref 12.0–15.0)
Lymphocytes Relative: 8.5 % — ABNORMAL LOW (ref 12.0–46.0)
Lymphs Abs: 1.1 10*3/uL (ref 0.7–4.0)
MCHC: 33.1 g/dL (ref 30.0–36.0)
MCV: 84.1 fl (ref 78.0–100.0)
Monocytes Absolute: 1.1 10*3/uL — ABNORMAL HIGH (ref 0.1–1.0)
Monocytes Relative: 8.4 % (ref 3.0–12.0)
Neutro Abs: 10.7 10*3/uL — ABNORMAL HIGH (ref 1.4–7.7)
Neutrophils Relative %: 81.5 % — ABNORMAL HIGH (ref 43.0–77.0)
Platelets: 355 10*3/uL (ref 150.0–400.0)
RBC: 4.35 Mil/uL (ref 3.87–5.11)
RDW: 14.7 % (ref 11.5–15.5)
WBC: 13.1 10*3/uL — ABNORMAL HIGH (ref 4.0–10.5)

## 2022-08-15 LAB — C-REACTIVE PROTEIN: CRP: 16.5 mg/dL (ref 0.5–20.0)

## 2022-08-15 LAB — AMYLASE: Amylase: 25 U/L — ABNORMAL LOW (ref 27–131)

## 2022-08-15 LAB — SEDIMENTATION RATE: Sed Rate: 99 mm/hr — ABNORMAL HIGH (ref 0–30)

## 2022-08-15 MED ORDER — ONDANSETRON HCL 4 MG PO TABS: 4.0000 mg | ORAL_TABLET | Freq: Three times a day (TID) | ORAL | 1 refills | Status: DC | PRN

## 2022-08-15 MED ORDER — HYOSCYAMINE SULFATE 0.125 MG SL SUBL: 0.1250 mg | SUBLINGUAL_TABLET | SUBLINGUAL | 0 refills | Status: AC | PRN

## 2022-08-15 NOTE — Patient Instructions (Signed)
_______________________________________________________  If your blood pressure at your visit was 140/90 or greater, please contact your primary care physician to follow up on this.  _______________________________________________________  If you are age 69 or older, your body mass index should be between 23-30. Your Body mass index is 46.37 kg/m. If this is out of the aforementioned range listed, please consider follow up with your Primary Care Provider.  If you are age 76 or younger, your body mass index should be between 19-25. Your Body mass index is 46.37 kg/m. If this is out of the aformentioned range listed, please consider follow up with your Primary Care Provider.   ________________________________________________________  The [facility] providers would like to encourage you to use [facility] to communicate with providers for non-urgent requests or questions.  Due to long hold times on the telephone, sending your provider a message by [facility] may be a faster and more efficient way to get a response.  Please allow 48 business hours for a response.  Please remember that this is for non-urgent requests.  _______________________________________________________  We have sent medications to your pharmacy for you to pick up at your convenience.   ORAL REHYDRATION SOLUTION RECIPES   Sugar and salt water   ? 1 quart water ?  teaspoon salt ? 6 teaspoons sugar ? Optional: Crystal Light to taste (especially lemonade or orange-pineapple flavors)   Gatorade G2   ? 4 cups Gatorade G2 (or one, 32 ounce bottle) ? 1/2 teaspoon salt   Chicken Broth   ? 4 cups water ? 1 dry chicken broth cube ?  teaspoon salt ? 2 tablespoon sugar OR ? 2 cups liquid broth ? 2 cups water ? 2 tablespoon sugar  Tomato Juice   ? 2  cups tomato juice ? 1  cups water  Homemade Cereal Based  ?  cup dry, precooked baby rice cereal ? 2 cups water ?  teaspoon salt ? Combine ingredients  and mix until well dissolved and smooth. Refrigerate. Solution should be thick, but pourable and drinkable.  If you get worse go to the ED.  You have been scheduled for a CT scan of the abdomen and pelvis. You are scheduled on 08/16/2022 at [place]. You should arrive at 2:30pm to drink the prep,  prior to your appointment time for registration.    Please follow the written instructions below on the day of your exam:   1) Do not eat anything after 10:30AM (4 hours prior to your test)   If you have any questions regarding your exam or if you need to reschedule, you may call [facility] at [phone] between the hours of 8:00 am and 5:00 pm, Monday-Friday.      I appreciate the opportunity to care for you. Silvano Rusk, MD, [facility]

## 2022-08-15 NOTE — Progress Notes (Signed)
Whitney Higgins 67 y.o. Jun 30, 1955 [id number]  Assessment & Plan:   Encounter Diagnoses  Name Primary?   Nausea and vomiting, unspecified vomiting type Yes   Generalized abdominal pain    Bloody stools    Crohn's disease of colon with rectal bleeding     The patient is not well multitude of symptoms question infectious versus Crohn's flare.  She could have a complication of her Crohn's disease such as abscess.  Evaluation as below.  Treatment as below.  Further plans pending this evaluation.  Orders Placed This Encounter  Procedures   CT Abdomen Pelvis W Contrast   CBC with Differential/Platelet   Comprehensive metabolic panel   Amylase   Lipase   Sedimentation rate   C-reactive protein    Meds ordered this encounter  Medications   ondansetron (ZOFRAN) 4 MG tablet    Sig: Take 1 tablet (4 mg total) by mouth every 8 (eight) hours as needed for nausea or vomiting.    Dispense:  30 tablet    Refill:  1   hyoscyamine (LEVSIN SL) 0.125 MG SL tablet    Sig: Place 1 tablet (0.125 mg total) under the tongue every 4 (four) hours as needed.    Dispense:  30 tablet    Refill:  0   Oral rehydration solution recipes provided to the patient and she is encouraged to pick 1 or 2 of these and try to force fluids.    Subjective:   Chief Complaint: Multiple-nausea vomiting abdominal pain diarrhea issues rectal bleeding feverish.  HPI  67year-old white woman with a history of left-sided Crohn's colitis, segmental disease, maintained on Lialda, also with a history of sessile serrated polyps last seen in April 2023.  She was to have a colonoscopy in January but despite multiple attempts to do this she did not do it.  She had nausea vomiting problems and we rescheduled using Reglan but she never followed through with that.  Since that time she has had problems with nausea and vomiting that have persisted, generalized abdominal pain diarrhea and has been passing blood at times but not in  several days she says.  She has felt feverish but has not taken her temperature.  She has been on Naprosyn for left knee pain for months now, also using Tylenol.  She is trying to keep liquids down but struggling.  Says she is itching all over.  She dropped her mesalamine to 1 twice a day because she is struggling to keep the pills down with vomiting.  She does not have any antiemetics.  She has not gone to the ER because she has been afraid to go there she says.  She feels weak and fatigued.  Having a lot of orthopedic pains as well.  Colonoscopy 03/24/2019 - Two 1 to 2 mm polyps in the transverse colon, removed with a cold snare. Resected and retrieved. - Severe diverticulosis in the sigmoid colon and in the descending colon. There was narrowing of the colon in association with the diverticular opening. - The examination was otherwise normal on direct and retroflexion views. - Biopsies were taken with a cold forceps from the ascending colon, transverse colon and descending colon for evaluation of microscopic colitis. - Biopsies were taken with a cold forceps for histology in the sigmoid colon. THE PREVIOUSLY SEEN COLITIS IN SIGMOID AND RECTUM LOOKS RESOLVED   1. Surgical [P], colon, descending, transverse, and ascending biopsies - COLONIC MUCOSA WITH NO SPECIFIC HISTOPATHOLOGIC CHANGES. SEE NOTE - NEGATIVE FOR  ACUTE INFLAMMATION, FEATURES OF CHRONICITY, GRANULOMAS OR DYSPLASIA 2. Surgical [P], colon, transverse, polyp (2) - DIMINUTIVE HYPERPLASTIC POLYP(S) 3. Surgical [P], colon, random sites (sigmoid) - MILDLY ACTIVE CHRONIC COLITIS WITH RARE NONNECROTIZING EPITHELIOID CELL MICROGRANULOMAS. SEE NOTE - NEGATIVE FOR DYSPLASIA  Colonoscopy 12/03/2018-cecum not reached - Congested, erythematous, friable (with contact bleeding), granular and vascular-patterndecreased mucosa in the rectum. Biopsied. - Altered vascular, congested, erythematous, eroded and friable (with contact bleeding)mucosa in the  sigmoid colon. Biopsied. - Stricture in the sigmoid colon. - Three diminutive polyps in the transverse colon, removed with a cold snare. Resected and retrieved. - Diverticulosis in the sigmoid colon. - The examination was otherwise normal on direct and retroflexion views  1. Surgical [P], colon, transverse, polyp (3) - SESSILE SERRATED POLYP WITHOUT CYTOLOGIC DYSPLASIA. 2. Surgical [P], colon, sigmoid - MINIMALLY ACTIVE CHRONIC COLITIS, CONSISTENT WITH INFLAMMATORY BOWEL DISEASE. - THERE IS NO EVIDENCE OF DYSPLASIA OR MALIGNANCY. - SEE COMMENT. 3. Surgical [P], colon, rectum - MODERATELY ACTIVE CHRONIC COLITIS, CONSISTENT WITH INFLAMMATORY BOWEL DISEASE. - THERE IS NO EVIDENCE OF DYSPLASIA OR MALIGNANCY.  Current Meds  Medication Sig   acetaminophen (TYLENOL) 500 MG tablet Take 1,000 mg by mouth every 6 (six) hours as needed for pain.   albuterol (PROVENTIL HFA;VENTOLIN HFA) 108 (90 BASE) MCG/ACT inhaler Inhale 2 puffs into the lungs every 6 (six) hours as needed for wheezing.   albuterol (PROVENTIL) (2.5 MG/3ML) 0.083% nebulizer solution    ALPRAZolam (XANAX) 0.5 MG tablet Take 0.25 mg by mouth at bedtime as needed for sleep.   amLODipine (NORVASC) 5 MG tablet Take 5 mg by mouth daily.   atorvastatin (LIPITOR) 20 MG tablet Take 20 mg by mouth daily.   clotrimazole (LOTRIMIN) 1 % cream    Cyanocobalamin (B12 LIQUID HEALTH BOOSTER PO) Take 5,000 mcg by mouth daily.   fluticasone (FLONASE) 50 MCG/ACT nasal spray    levothyroxine (SYNTHROID) 75 MCG tablet Take 75 mcg by mouth daily.   LIALDA 1.2 g EC tablet TAKE TWO TABLETS BY MOUTH TWICE A DAY (Patient taking differently: Take 1.2 g by mouth 2 (two) times daily.)   liothyronine (CYTOMEL) 5 MCG tablet TAKE 1 TABLET TWICE A DAY 30 MINUTES BEFORE A MEAL   lisinopril-hydrochlorothiazide (PRINZIDE,ZESTORETIC) 20-25 MG per tablet Take 1 tablet by mouth daily.   montelukast (SINGULAIR) 10 MG tablet Take 10 mg by mouth daily.   naproxen (NAPROSYN)  500 MG tablet Take 500 mg by mouth daily as needed.   omeprazole (PRILOSEC) 20 MG capsule Take 20 mg by mouth as needed (for reflux).   potassium chloride (KLOR-CON) 10 MEQ tablet TAKE 1 TABLET BY MOUTH EVERY DAY   venlafaxine XR (EFFEXOR-XR) 150 MG 24 hr capsule Take 150 mg by mouth 2 (two) times daily.   Vitamin D, Ergocalciferol, (DRISDOL) 1.25 MG (50000 UNIT) CAPS capsule Take 50,000 Units by mouth every 7 (seven) days.   Past Medical History:  Diagnosis Date   Abnormal alkaline phosphatase test 04/07/2019   Allergy    Anxiety    takes Xanax daily   Arthritis    Asthma    inhaler prn and Singulair daily;OTC Zyrtec daily   Cataract    Chronic back pain    stenosis   COVID-19 05/2019   Crohn's colitis ([place]) - left-sided 01/06/2019   Depression    takes Effexor daily   Diverticulosis    Gallstones    GERD (gastroesophageal reflux disease)    takes Omeprazole daily   H/O: pituitary tumor  History of blood transfusion    no abnormal reaction noted   History of bronchitis    last time less than a yr ago   History of colon polyps    Hx of colonic polyps 01/10/2019   Hyperlipidemia    takes Pravastatin daily   Hypertension    takes Amlodipine and Lisinopril daily   Hypothyroidism    takes synthroid and cytomel daily   IBD (inflammatory bowel disease) UC vs Crohn's 01/06/2019   Insomnia    takes Xanax nightly   Joint pain    Pneumonia    hx of;last time about 2yrs ago   PONV (postoperative nausea and vomiting)    Shortness of breath    with exertion   Weakness    left leg   Past Surgical History:  Procedure Laterality Date   [place] AND CURETTAGE OF UTERUS  1979   epidural injections     EYE SURGERY     at age 1;vision correction   JOINT REPLACEMENT  2010   right hip   LUMBAR LAMINECTOMY/DECOMPRESSION MICRODISCECTOMY Left 03/18/2013   Procedure: LEFT LUMBAR FOUR-FIVE,LUMBAR  FIVE-SACRAL ONE MICRODISCECTOMY;  Surgeon: Faythe Ghee, MD;  Location: [facility];  Service: Neurosurgery;  Laterality: Left;  left   partial removal of right kidney  1971   pituitary tumor removed  1983   POLYPECTOMY     SHOULDER SURGERY Right    Social History   Social History Narrative   Married, on Medicare prior to 72 question disabled   Rare red wine   Never smoker   No drug use   family history includes Allergic rhinitis in her brother, father, mother, and sister; Asthma in her mother.   Review of Systems  As per HPI Objective:   Physical Exam @BP  110/60 (BP Location: Left Arm, Patient Position: Sitting, Cuff Size: Large)   Pulse (!) 110   Temp 97.9 F (36.6 C)   Ht 5\' 4"  (1.626 m)   Wt 270 lb 2 oz (122.5 kg)   BMI 46.37 kg/m @  General:  Mildly ill appearing obese ww Eyes:  anicteric. Lungs: Clear to auscultation bilaterally. Heart:   S1S2, no rubs, murmurs, gallops. Abdomen:  soft,  mildly tender diffusely, BS +  Rectal: Shana Chute, CMA present  Soft brown stool - heme +  Neuro:  A&O x 3.  Psych:  appropriate mood and  Affect.   Data Reviewed: See HPI

## 2022-08-16 ENCOUNTER — Telehealth: Payer: Self-pay

## 2022-08-16 ENCOUNTER — Ambulatory Visit (HOSPITAL_BASED_OUTPATIENT_CLINIC_OR_DEPARTMENT_OTHER)
Admission: RE | Admit: 2022-08-16 | Discharge: 2022-08-16 | Disposition: A | Discharge status: Home / Self Care | Payer: Medicare Other | Source: Ambulatory Visit | Attending: Internal Medicine | Admitting: Internal Medicine

## 2022-08-16 ENCOUNTER — Encounter (HOSPITAL_BASED_OUTPATIENT_CLINIC_OR_DEPARTMENT_OTHER): Payer: Self-pay

## 2022-08-16 DIAGNOSIS — K5720 Diverticulitis of large intestine with perforation and abscess without bleeding: Secondary | ICD-10-CM | POA: Diagnosis not present

## 2022-08-16 DIAGNOSIS — R112 Nausea with vomiting, unspecified: Secondary | ICD-10-CM | POA: Insufficient documentation

## 2022-08-16 DIAGNOSIS — R1084 Generalized abdominal pain: Secondary | ICD-10-CM | POA: Insufficient documentation

## 2022-08-16 DIAGNOSIS — K50111 Crohn's disease of large intestine with rectal bleeding: Secondary | ICD-10-CM

## 2022-08-16 MED ORDER — IOHEXOL 300 MG/ML  SOLN
100.0000 mL | Freq: Once | INTRAMUSCULAR | Status: AC | PRN
  Administered 2022-08-16: 100 mL via INTRAVENOUS

## 2022-08-16 NOTE — Telephone Encounter (Signed)
Call Report was received from [facility] Radiology  IMPRESSION: 1. Wall thickening of the sigmoid colon with surrounding inflammatory changes and probable phlegmon in the sigmoid mesocolon, consistent with acute inflammation. There is underlying sigmoid diverticulosis, and findings could be secondary to diverticulitis. Given the patient's history, active Crohn's colitis not excluded. No other signs of active bowel inflammation. No drainable fluid collection identified. 2. No evidence of fistula to the bladder, vagina or skin. 3. Prominent stool throughout the colon with mild fecalization of the distal small bowel. No evidence of bowel obstruction. 4.  Aortic Atherosclerosis (ICD10-I70.0). 5. These results will be called to the ordering clinician or representative by the Radiologist Assistant, and communication documented in the PACS or [employer].     Please review and advise

## 2022-08-17 ENCOUNTER — Inpatient Hospital Stay (HOSPITAL_COMMUNITY)
Admission: EM | Admit: 2022-08-17 | Discharge: 2022-09-04 | DRG: 330 | Disposition: A | Discharge status: Home Health | Payer: Medicare Other | Attending: Internal Medicine | Admitting: Internal Medicine

## 2022-08-17 ENCOUNTER — Other Ambulatory Visit: Payer: Self-pay

## 2022-08-17 ENCOUNTER — Encounter (HOSPITAL_COMMUNITY): Payer: Self-pay | Admitting: Pharmacy Technician

## 2022-08-17 DIAGNOSIS — T380X5A Adverse effect of glucocorticoids and synthetic analogues, initial encounter: Secondary | ICD-10-CM | POA: Diagnosis not present

## 2022-08-17 DIAGNOSIS — E7800 Pure hypercholesterolemia, unspecified: Secondary | ICD-10-CM | POA: Diagnosis present

## 2022-08-17 DIAGNOSIS — M549 Dorsalgia, unspecified: Secondary | ICD-10-CM | POA: Diagnosis present

## 2022-08-17 DIAGNOSIS — D62 Acute posthemorrhagic anemia: Secondary | ICD-10-CM | POA: Diagnosis not present

## 2022-08-17 DIAGNOSIS — R1031 Right lower quadrant pain: Secondary | ICD-10-CM

## 2022-08-17 DIAGNOSIS — Z79899 Other long term (current) drug therapy: Secondary | ICD-10-CM | POA: Diagnosis not present

## 2022-08-17 DIAGNOSIS — E039 Hypothyroidism, unspecified: Secondary | ICD-10-CM | POA: Diagnosis present

## 2022-08-17 DIAGNOSIS — Z8719 Personal history of other diseases of the digestive system: Secondary | ICD-10-CM | POA: Diagnosis not present

## 2022-08-17 DIAGNOSIS — Z79890 Hormone replacement therapy (postmenopausal): Secondary | ICD-10-CM | POA: Diagnosis not present

## 2022-08-17 DIAGNOSIS — I10 Essential (primary) hypertension: Secondary | ICD-10-CM | POA: Diagnosis present

## 2022-08-17 DIAGNOSIS — G4700 Insomnia, unspecified: Secondary | ICD-10-CM | POA: Diagnosis present

## 2022-08-17 DIAGNOSIS — K5792 Diverticulitis of intestine, part unspecified, without perforation or abscess without bleeding: Secondary | ICD-10-CM | POA: Diagnosis present

## 2022-08-17 DIAGNOSIS — M1990 Unspecified osteoarthritis, unspecified site: Secondary | ICD-10-CM | POA: Diagnosis present

## 2022-08-17 DIAGNOSIS — R748 Abnormal levels of other serum enzymes: Secondary | ICD-10-CM | POA: Diagnosis not present

## 2022-08-17 DIAGNOSIS — G8929 Other chronic pain: Secondary | ICD-10-CM | POA: Diagnosis present

## 2022-08-17 DIAGNOSIS — F418 Other specified anxiety disorders: Secondary | ICD-10-CM | POA: Diagnosis not present

## 2022-08-17 DIAGNOSIS — Z880 Allergy status to penicillin: Secondary | ICD-10-CM

## 2022-08-17 DIAGNOSIS — R935 Abnormal findings on diagnostic imaging of other abdominal regions, including retroperitoneum: Secondary | ICD-10-CM | POA: Diagnosis not present

## 2022-08-17 DIAGNOSIS — K5010 Crohn's disease of large intestine without complications: Secondary | ICD-10-CM | POA: Diagnosis present

## 2022-08-17 DIAGNOSIS — Z8616 Personal history of COVID-19: Secondary | ICD-10-CM | POA: Diagnosis not present

## 2022-08-17 DIAGNOSIS — Z881 Allergy status to other antibiotic agents status: Secondary | ICD-10-CM

## 2022-08-17 DIAGNOSIS — R7401 Elevation of levels of liver transaminase levels: Secondary | ICD-10-CM | POA: Diagnosis present

## 2022-08-17 DIAGNOSIS — F32A Depression, unspecified: Secondary | ICD-10-CM | POA: Diagnosis not present

## 2022-08-17 DIAGNOSIS — Z905 Acquired absence of kidney: Secondary | ICD-10-CM

## 2022-08-17 DIAGNOSIS — K50119 Crohn's disease of large intestine with unspecified complications: Secondary | ICD-10-CM

## 2022-08-17 DIAGNOSIS — F419 Anxiety disorder, unspecified: Secondary | ICD-10-CM | POA: Diagnosis present

## 2022-08-17 DIAGNOSIS — K50919 Crohn's disease, unspecified, with unspecified complications: Secondary | ICD-10-CM | POA: Diagnosis not present

## 2022-08-17 DIAGNOSIS — E876 Hypokalemia: Secondary | ICD-10-CM | POA: Diagnosis not present

## 2022-08-17 DIAGNOSIS — Z539 Procedure and treatment not carried out, unspecified reason: Secondary | ICD-10-CM | POA: Diagnosis present

## 2022-08-17 DIAGNOSIS — K5720 Diverticulitis of large intestine with perforation and abscess without bleeding: Principal | ICD-10-CM | POA: Diagnosis present

## 2022-08-17 DIAGNOSIS — Z96641 Presence of right artificial hip joint: Secondary | ICD-10-CM | POA: Diagnosis present

## 2022-08-17 DIAGNOSIS — R112 Nausea with vomiting, unspecified: Secondary | ICD-10-CM | POA: Diagnosis not present

## 2022-08-17 DIAGNOSIS — K529 Noninfective gastroenteritis and colitis, unspecified: Secondary | ICD-10-CM | POA: Diagnosis not present

## 2022-08-17 DIAGNOSIS — J45909 Unspecified asthma, uncomplicated: Secondary | ICD-10-CM | POA: Diagnosis present

## 2022-08-17 DIAGNOSIS — R933 Abnormal findings on diagnostic imaging of other parts of digestive tract: Secondary | ICD-10-CM

## 2022-08-17 DIAGNOSIS — Z9049 Acquired absence of other specified parts of digestive tract: Secondary | ICD-10-CM

## 2022-08-17 DIAGNOSIS — Z885 Allergy status to narcotic agent status: Secondary | ICD-10-CM | POA: Diagnosis not present

## 2022-08-17 DIAGNOSIS — F329 Major depressive disorder, single episode, unspecified: Secondary | ICD-10-CM | POA: Diagnosis present

## 2022-08-17 DIAGNOSIS — R739 Hyperglycemia, unspecified: Secondary | ICD-10-CM | POA: Diagnosis not present

## 2022-08-17 DIAGNOSIS — Z86010 Personal history of colonic polyps: Secondary | ICD-10-CM | POA: Diagnosis not present

## 2022-08-17 DIAGNOSIS — Z825 Family history of asthma and other chronic lower respiratory diseases: Secondary | ICD-10-CM | POA: Diagnosis not present

## 2022-08-17 DIAGNOSIS — K5090 Crohn's disease, unspecified, without complications: Secondary | ICD-10-CM | POA: Diagnosis not present

## 2022-08-17 DIAGNOSIS — R109 Unspecified abdominal pain: Secondary | ICD-10-CM | POA: Diagnosis not present

## 2022-08-17 DIAGNOSIS — R194 Change in bowel habit: Secondary | ICD-10-CM | POA: Diagnosis not present

## 2022-08-17 DIAGNOSIS — I2510 Atherosclerotic heart disease of native coronary artery without angina pectoris: Secondary | ICD-10-CM | POA: Diagnosis not present

## 2022-08-17 DIAGNOSIS — E785 Hyperlipidemia, unspecified: Secondary | ICD-10-CM | POA: Diagnosis not present

## 2022-08-17 DIAGNOSIS — R1032 Left lower quadrant pain: Secondary | ICD-10-CM

## 2022-08-17 DIAGNOSIS — K56609 Unspecified intestinal obstruction, unspecified as to partial versus complete obstruction: Secondary | ICD-10-CM | POA: Diagnosis not present

## 2022-08-17 DIAGNOSIS — K50112 Crohn's disease of large intestine with intestinal obstruction: Secondary | ICD-10-CM | POA: Diagnosis present

## 2022-08-17 DIAGNOSIS — D649 Anemia, unspecified: Secondary | ICD-10-CM | POA: Diagnosis not present

## 2022-08-17 DIAGNOSIS — K625 Hemorrhage of anus and rectum: Secondary | ICD-10-CM | POA: Diagnosis not present

## 2022-08-17 DIAGNOSIS — K219 Gastro-esophageal reflux disease without esophagitis: Secondary | ICD-10-CM | POA: Diagnosis present

## 2022-08-17 DIAGNOSIS — E669 Obesity, unspecified: Secondary | ICD-10-CM | POA: Diagnosis not present

## 2022-08-17 DIAGNOSIS — R1084 Generalized abdominal pain: Secondary | ICD-10-CM | POA: Diagnosis present

## 2022-08-17 DIAGNOSIS — Z6841 Body Mass Index (BMI) 40.0 and over, adult: Secondary | ICD-10-CM | POA: Diagnosis not present

## 2022-08-17 DIAGNOSIS — K50118 Crohn's disease of large intestine with other complication: Secondary | ICD-10-CM | POA: Diagnosis not present

## 2022-08-17 DIAGNOSIS — Z933 Colostomy status: Secondary | ICD-10-CM

## 2022-08-17 LAB — CBC WITH DIFFERENTIAL/PLATELET
Abs Immature Granulocytes: 0.06 10*3/uL (ref 0.00–0.07)
Basophils Absolute: 0.1 10*3/uL (ref 0.0–0.1)
Basophils Relative: 1 %
Eosinophils Absolute: 0.2 10*3/uL (ref 0.0–0.5)
Eosinophils Relative: 2 %
HCT: 36.9 % (ref 36.0–46.0)
Hemoglobin: 11.7 g/dL — ABNORMAL LOW (ref 12.0–15.0)
Immature Granulocytes: 1 %
Lymphocytes Relative: 10 %
Lymphs Abs: 1.1 10*3/uL (ref 0.7–4.0)
MCH: 27.7 pg (ref 26.0–34.0)
MCHC: 31.7 g/dL (ref 30.0–36.0)
MCV: 87.4 fL (ref 80.0–100.0)
Monocytes Absolute: 0.9 10*3/uL (ref 0.1–1.0)
Monocytes Relative: 8 %
Neutro Abs: 9 10*3/uL — ABNORMAL HIGH (ref 1.7–7.7)
Neutrophils Relative %: 78 %
Platelets: 338 10*3/uL (ref 150–400)
RBC: 4.22 MIL/uL (ref 3.87–5.11)
RDW: 13.8 % (ref 11.5–15.5)
WBC: 11.3 10*3/uL — ABNORMAL HIGH (ref 4.0–10.5)
nRBC: 0 % (ref 0.0–0.2)

## 2022-08-17 LAB — COMPREHENSIVE METABOLIC PANEL
ALT: 53 U/L — ABNORMAL HIGH (ref 0–44)
AST: 45 U/L — ABNORMAL HIGH (ref 15–41)
Albumin: 3 g/dL — ABNORMAL LOW (ref 3.5–5.0)
Alkaline Phosphatase: 133 U/L — ABNORMAL HIGH (ref 38–126)
Anion gap: 12 (ref 5–15)
BUN: 22 mg/dL (ref 8–23)
CO2: 27 mmol/L (ref 22–32)
Calcium: 9.3 mg/dL (ref 8.9–10.3)
Chloride: 94 mmol/L — ABNORMAL LOW (ref 98–111)
Creatinine, Ser: 1.19 mg/dL — ABNORMAL HIGH (ref 0.44–1.00)
GFR, Estimated: 50 mL/min — ABNORMAL LOW (ref >60)
Glucose, Bld: 107 mg/dL — ABNORMAL HIGH (ref 70–99)
Potassium: 3.6 mmol/L (ref 3.5–5.1)
Sodium: 133 mmol/L — ABNORMAL LOW (ref 135–145)
Total Bilirubin: 0.4 mg/dL (ref 0.3–1.2)
Total Protein: 7.9 g/dL (ref 6.5–8.1)

## 2022-08-17 LAB — LIPASE, BLOOD: Lipase: 30 U/L (ref 11–51)

## 2022-08-17 MED ORDER — HYDROMORPHONE HCL 1 MG/ML IJ SOLN
0.5000 mg | INTRAMUSCULAR | Status: DC | PRN
  Administered 2022-08-17: 0.5 mg via INTRAVENOUS
  Filled 2022-08-17: qty 0.5

## 2022-08-17 MED ORDER — PANTOPRAZOLE SODIUM 40 MG IV SOLR
40.0000 mg | INTRAVENOUS | Status: DC
  Administered 2022-08-17 – 2022-08-19 (×3): 40 mg via INTRAVENOUS
  Filled 2022-08-17 (×3): qty 10

## 2022-08-17 MED ORDER — MONTELUKAST SODIUM 10 MG PO TABS
10.0000 mg | ORAL_TABLET | Freq: Every day | ORAL | Status: DC
  Administered 2022-08-17 – 2022-09-03 (×18): 10 mg via ORAL
  Filled 2022-08-17 (×18): qty 1

## 2022-08-17 MED ORDER — CIPROFLOXACIN IN D5W 400 MG/200ML IV SOLN: 400.0000 mg | Freq: Once | INTRAVENOUS | Status: DC

## 2022-08-17 MED ORDER — ONDANSETRON HCL 4 MG PO TABS
4.0000 mg | ORAL_TABLET | Freq: Four times a day (QID) | ORAL | Status: DC | PRN
  Administered 2022-08-18 – 2022-09-04 (×2): 4 mg via ORAL
  Filled 2022-08-17 (×2): qty 1

## 2022-08-17 MED ORDER — METRONIDAZOLE 500 MG/100ML IV SOLN
500.0000 mg | Freq: Two times a day (BID) | INTRAVENOUS | Status: DC
  Administered 2022-08-17 – 2022-08-20 (×6): 500 mg via INTRAVENOUS
  Filled 2022-08-17 (×6): qty 100

## 2022-08-17 MED ORDER — VENLAFAXINE HCL ER 150 MG PO CP24
150.0000 mg | ORAL_CAPSULE | Freq: Two times a day (BID) | ORAL | Status: DC
  Administered 2022-08-17 – 2022-09-04 (×36): 150 mg via ORAL
  Filled 2022-08-17 (×37): qty 1

## 2022-08-17 MED ORDER — ONDANSETRON HCL 4 MG/2ML IJ SOLN
4.0000 mg | Freq: Once | INTRAMUSCULAR | Status: AC
  Administered 2022-08-17: 4 mg via INTRAVENOUS
  Filled 2022-08-17: qty 2

## 2022-08-17 MED ORDER — SODIUM CHLORIDE 0.9 % IV BOLUS
1000.0000 mL | Freq: Once | INTRAVENOUS | Status: AC
  Administered 2022-08-17: 1000 mL via INTRAVENOUS

## 2022-08-17 MED ORDER — HYOSCYAMINE SULFATE 0.125 MG SL SUBL: 0.2500 mg | SUBLINGUAL_TABLET | SUBLINGUAL | Status: DC | PRN

## 2022-08-17 MED ORDER — ONDANSETRON HCL 4 MG/2ML IJ SOLN
4.0000 mg | Freq: Four times a day (QID) | INTRAMUSCULAR | Status: DC | PRN
  Administered 2022-08-17 – 2022-09-03 (×20): 4 mg via INTRAVENOUS
  Filled 2022-08-17 (×22): qty 2

## 2022-08-17 MED ORDER — LEVOTHYROXINE SODIUM 75 MCG PO TABS
75.0000 ug | ORAL_TABLET | Freq: Every day | ORAL | Status: DC
  Administered 2022-08-18 – 2022-09-04 (×17): 75 ug via ORAL
  Filled 2022-08-17 (×17): qty 1

## 2022-08-17 MED ORDER — AMLODIPINE BESYLATE 5 MG PO TABS
5.0000 mg | ORAL_TABLET | Freq: Every day | ORAL | Status: DC
  Administered 2022-08-18 – 2022-09-04 (×18): 5 mg via ORAL
  Filled 2022-08-17 (×18): qty 1

## 2022-08-17 MED ORDER — CIPROFLOXACIN IN D5W 400 MG/200ML IV SOLN
400.0000 mg | Freq: Two times a day (BID) | INTRAVENOUS | Status: DC
  Administered 2022-08-17 – 2022-08-20 (×6): 400 mg via INTRAVENOUS
  Filled 2022-08-17 (×6): qty 200

## 2022-08-17 MED ORDER — HYDROMORPHONE HCL 1 MG/ML IJ SOLN: 1.0000 mg | INTRAMUSCULAR | Status: DC | PRN

## 2022-08-17 MED ORDER — PROCHLORPERAZINE EDISYLATE 10 MG/2ML IJ SOLN
5.0000 mg | Freq: Once | INTRAMUSCULAR | Status: AC
  Administered 2022-08-17: 5 mg via INTRAVENOUS
  Filled 2022-08-17: qty 2

## 2022-08-17 NOTE — ED Provider Notes (Signed)
[place] EMERGENCY DEPARTMENT AT [facility] Provider Note   CSN: [id number] Arrival date & time: 08/17/22  1335     History  Chief Complaint  Patient presents with   Abdominal Pain    Whitney Higgins is a 67 y.o. female.  The history is provided by the patient and medical records.  Abdominal Pain  67 year old female with history of Crohn's disease, presenting to the ED with abdominal pain.  Seen by her GI physician, Dr. Carlean Purl, 2 days ago and had outpatient CT scan which was concerning for Crohn's flare versus diverticulitis.  It was recommended that she come in for IV antibiotics and admission.  She reports since being on oral pain and nausea medication she is feeling better and is tolerating some oral fluids.  She is having a little bit of loose stool but denies any frank blood.  Home Medications Prior to Admission medications   Medication Sig Start Date End Date Taking? Authorizing Provider  acetaminophen (TYLENOL) 500 MG tablet Take 1,000 mg by mouth every 6 (six) hours as needed for pain.    [provider]  albuterol (PROVENTIL HFA;VENTOLIN HFA) 108 (90 BASE) MCG/ACT inhaler Inhale 2 puffs into the lungs every 6 (six) hours as needed for wheezing.    [provider]  albuterol (PROVENTIL) (2.5 MG/3ML) 0.083% nebulizer solution  04/18/16   [provider]  ALPRAZolam (XANAX) 0.5 MG tablet Take 0.25 mg by mouth at bedtime as needed for sleep.    [provider]  amLODipine (NORVASC) 5 MG tablet Take 5 mg by mouth daily.    [provider]  atorvastatin (LIPITOR) 20 MG tablet Take 20 mg by mouth daily.    [provider]  cetirizine (ZYRTEC) 10 MG tablet Take 10 mg by mouth daily. Patient not taking: Reported on 08/15/2022    [provider]  clotrimazole (LOTRIMIN) 1 % cream  08/29/18   [provider]  Cyanocobalamin (B12 LIQUID HEALTH BOOSTER PO) Take 5,000 mcg by mouth daily.    [provider]  fluticasone Asencion Islam) 50 MCG/ACT nasal spray  02/10/19   [provider]  hyoscyamine (LEVSIN SL) 0.125 MG SL tablet Place 1 tablet (0.125 mg total) under the tongue every 4 (four) hours as needed. 08/15/22   Gatha Mayer, MD  levothyroxine (SYNTHROID) 75 MCG tablet Take 75 mcg by mouth daily. 11/06/18   [provider]  LIALDA 1.2 g EC tablet TAKE TWO TABLETS BY MOUTH TWICE A DAY Patient taking differently: Take 1.2 g by mouth 2 (two) times daily. 04/27/22   Gatha Mayer, MD  liothyronine (CYTOMEL) 5 MCG tablet TAKE 1 TABLET TWICE A DAY 30 MINUTES BEFORE A MEAL 12/04/18   [provider]  lisinopril-hydrochlorothiazide (PRINZIDE,ZESTORETIC) 20-25 MG per tablet Take 1 tablet by mouth daily.    [provider]  montelukast (SINGULAIR) 10 MG tablet Take 10 mg by mouth daily.    [provider]  naproxen (NAPROSYN) 500 MG tablet Take 500 mg by mouth daily as needed. 01/25/19   [provider]  omeprazole (PRILOSEC) 20 MG capsule Take 20 mg by mouth as needed (for reflux).    [provider]  ondansetron (ZOFRAN) 4 MG tablet Take 1 tablet (4 mg total) by mouth every 8 (eight) hours as needed for nausea or vomiting. 08/15/22   Gatha Mayer, MD  potassium chloride (KLOR-CON) 10 MEQ tablet TAKE 1 TABLET BY MOUTH EVERY DAY 10/29/15   [provider]  venlafaxine XR (EFFEXOR-XR) 150 MG 24 hr capsule Take 150 mg by mouth 2 (two) times daily.    [provider]  Vitamin D, Ergocalciferol, (DRISDOL) 1.25 MG (50000 UNIT) CAPS capsule Take 50,000 Units by mouth every 7 (seven) days. 04/17/22   [provider]      Allergies    Codeine, Clarithromycin, Dicyclomine hcl, and Penicillins    Review of Systems   Review of Systems  Gastrointestinal:  Positive for abdominal pain.  All other systems reviewed and are negative.   Physical Exam Updated Vital Signs BP (!) 168/88 (BP Location: Right Arm) Comment:  Simultaneous filing. User may not have seen previous data.  Pulse (!) 108 Comment: Simultaneous filing. User may not have seen previous data.  Temp 97.7 F (36.5 C) (Oral) Comment: Simultaneous filing. User may not have seen previous data.  Resp 19 Comment: Simultaneous filing. User may not have seen previous data.  SpO2 97% Comment: Simultaneous filing. User may not have seen previous data.  Physical Exam Vitals and nursing note reviewed.  Constitutional:      Appearance: She is well-developed.  HENT:     Head: Normocephalic and atraumatic.  Eyes:     Conjunctiva/sclera: Conjunctivae normal.     Pupils: Pupils are equal, round, and reactive to light.  Cardiovascular:     Rate and Rhythm: Normal rate and regular rhythm.     Heart sounds: Normal heart sounds.  Pulmonary:     Effort: Pulmonary effort is normal.     Breath sounds: Normal breath sounds.  Abdominal:     General: Bowel sounds are normal.     Palpations: Abdomen is soft.     Tenderness: There is abdominal tenderness in the right lower quadrant and left lower quadrant.  Musculoskeletal:        General: Normal range of motion.     Cervical back: Normal range of motion.  Skin:    General: Skin is warm and dry.  Neurological:     Mental Status: She is alert and oriented to person, place, and time.     ED Results / Procedures / Treatments   Labs (all labs ordered are listed, but only abnormal results are displayed) Labs Reviewed  CBC WITH DIFFERENTIAL/PLATELET - Abnormal; Notable for the following components:      Result Value   WBC 11.3 (*)    Hemoglobin 11.7 (*)    Neutro Abs 9.0 (*)    All other components within normal limits  COMPREHENSIVE METABOLIC PANEL  LIPASE, BLOOD    EKG None  Radiology CT Abdomen Pelvis W Contrast  Result Date: 08/16/2022 CLINICAL DATA:  Abdominal pain. History of Crohn's disease. Evaluate for abscess. EXAM: CT ABDOMEN AND PELVIS WITH CONTRAST TECHNIQUE: Multidetector CT imaging  of the abdomen and pelvis was performed using the standard protocol following bolus administration of intravenous contrast. RADIATION DOSE REDUCTION: This exam was performed according to the departmental dose-optimization program which includes automated exposure control, adjustment of the mA and/or kV according to patient size and/or use of iterative reconstruction technique. CONTRAST:  198mL OMNIPAQUE IOHEXOL 300 MG/ML  SOLN COMPARISON:  Abdominopelvic CT 11/25/2019. FINDINGS: Lower chest: Clear lung bases. No significant pleural or pericardial effusion. Hepatobiliary: The liver is normal in density without suspicious focal abnormality. No significant biliary dilatation status post cholecystectomy. Pancreas: Unremarkable. No pancreatic ductal dilatation or surrounding inflammatory changes. Spleen: Normal in size without focal abnormality. Adrenals/Urinary Tract: Both adrenal glands appear normal. No evidence of urinary tract calculus, suspicious  renal lesion or hydronephrosis. No definite bladder abnormalities are identified. Bladder assessment limited by incomplete distension and artifact from the patient's right total hip arthroplasty. Stomach/Bowel: Enteric contrast has passed into the mid small bowel, but not traversed the terminal ileum or ileocecal valve. The stomach appears unremarkable for its degree of distention. The proximal small bowel is normal in caliber, without wall thickening or surrounding inflammation. There is mild fecalization of the distal small bowel without definite small bowel wall thickening or distention. The appendix is not visualized. There is prominent stool throughout the colon. The proximal to mid colon is normal caliber without wall thickening. However, there is moderate wall thickening throughout the sigmoid colon with surrounding inflammatory changes. Probable phlegmon in the sigmoid mesocolon measuring approximately 4.3 x 3.5 cm on image 69/2. No drainable fluid collection  identified. A small fistula in this area cannot be excluded. There is no evidence of fistula to the bladder, vagina or skin. Underlying sigmoid diverticulosis as correlated with previous study. Vascular/Lymphatic: There are no enlarged abdominal or pelvic lymph nodes. Aortic and branch vessel atherosclerosis without evidence of aneurysm or large vessel occlusion. Reproductive: The uterus and ovaries appear unremarkable. No evidence of adnexal mass. Other: Stable small umbilical hernia containing only fat. As above, pelvic inflammatory changes surrounding the sigmoid colon. No ascites or free intraperitoneal air. Musculoskeletal: No acute or significant osseous findings. Multilevel thoracolumbar spondylosis. Previous right total hip arthroplasty. IMPRESSION: 1. Wall thickening of the sigmoid colon with surrounding inflammatory changes and probable phlegmon in the sigmoid mesocolon, consistent with acute inflammation. There is underlying sigmoid diverticulosis, and findings could be secondary to diverticulitis. Given the patient's history, active Crohn's colitis not excluded. No other signs of active bowel inflammation. No drainable fluid collection identified. 2. No evidence of fistula to the bladder, vagina or skin. 3. Prominent stool throughout the colon with mild fecalization of the distal small bowel. No evidence of bowel obstruction. 4.  Aortic Atherosclerosis (ICD10-I70.0). 5. These results will be called to the ordering clinician or representative by the Radiologist Assistant, and communication documented in the PACS or [employer]. Electronically Signed   By: Richardean Sale M.D.   On: 08/16/2022 16:31    Procedures Procedures    Medications Ordered in ED Medications  metroNIDAZOLE (FLAGYL) IVPB 500 mg (has no administration in time range)  ondansetron (ZOFRAN) injection 4 mg (has no administration in time range)  sodium chloride 0.9 % bolus 1,000 mL (has no administration in time range)   ciprofloxacin (CIPRO) IVPB 400 mg (has no administration in time range)  HYDROmorphone (DILAUDID) injection 1 mg (has no administration in time range)  ondansetron (ZOFRAN) tablet 4 mg (has no administration in time range)    Or  ondansetron (ZOFRAN) injection 4 mg (has no administration in time range)    ED Course/ Medical Decision Making/ A&P                             Medical Decision Making Risk Prescription drug management. Decision regarding hospitalization.   67 year old female here with abdominal pain.  Outpatient CT scan yesterday concerning for diverticulitis versus Crohn's flare.  She was sent in by her GI physician, Dr. Carlean Purl, for IV antibiotics and admission.  She is afebrile and nontoxic.  Does have some lower abdominal tenderness but no peritoneal signs.  She is hemodynamically stable.  Does have mild leukocytosis today.  Started IV Cipro/Flagyl as she does have penicillin allergy.  Will  admit.  Spoke with hospitalist, Dr. Robb Matar-- will admit for ongoing care.  Secure message sent to on call provider for [facility] (Dr. Leonides Schanz) for hospital consultation.  Final Clinical Impression(s) / ED Diagnoses Final diagnoses:  Diverticulitis    Rx / DC Orders ED Discharge Orders     None         Garlon Hatchet, PA-C 08/17/22 1455    Derwood Kaplan, MD 08/18/22 [phone]

## 2022-08-17 NOTE — Telephone Encounter (Signed)
Spoke with pt and she is aware of results and the need for admission and IV antibiotics. She will go to the hospital.

## 2022-08-17 NOTE — Progress Notes (Signed)
The patient is vomiting and it is not time for Zofran Messaged Raenette Rover to request medication.

## 2022-08-17 NOTE — ED Triage Notes (Signed)
Pt here POV with reports of being called by PCP to come for admission and IV abx. Pt reports abdominal pain for the last month. Had CT scan done yesterday.

## 2022-08-17 NOTE — Telephone Encounter (Signed)
I recommend that she go to ED for eval and treatment - I think she needs IV ABX and admission

## 2022-08-17 NOTE — ED Notes (Signed)
ED TO INPATIENT HANDOFF REPORT  ED Nurse Name and Phone #: Cristobal Goldmann [phone]  S Name/Age/Gender Heide Spark 67 y.o. female Room/Bed: WA10/WA10  Code Status   Code Status: Full Code  Home/SNF/Other Home Patient oriented to: self, place, time, and situation Is this baseline? Yes   Triage Complete: Triage complete  Chief Complaint Acute diverticulitis [K57.92]  Triage Note Pt here POV with reports of being called by PCP to come for admission and IV abx. Pt reports abdominal pain for the last month. Had CT scan done yesterday.   Allergies Allergies  Allergen Reactions   Codeine Nausea And Vomiting   Clarithromycin Rash   Dicyclomine Hcl     Dizzy, and headache   Penicillins Rash    Level of Care/Admitting Diagnosis ED Disposition     ED Disposition  Admit   Condition  --   Comment  Hospital Area: [facility] [[id number]]  Level of Care: Med-Surg [16]  May admit patient to [facility] or [facility] if equivalent level of care is available:: No  Covid Evaluation: Asymptomatic - no recent exposure (last 10 days) testing not required  Diagnosis: Acute diverticulitis [id number]  Admitting Physician: Reubin Milan [id number]  Attending Physician: Reubin Milan [id number]  Certification:: I certify this patient will need inpatient services for at least 2 midnights  Estimated Length of Stay: 2          B Medical/Surgery History Past Medical History:  Diagnosis Date   Abnormal alkaline phosphatase test 04/07/2019   Allergy    Anxiety    takes Xanax daily   Arthritis    Asthma    inhaler prn and Singulair daily;OTC Zyrtec daily   Cataract    Chronic back pain    stenosis   COVID-19 05/2019   Crohn's colitis ([place]) - left-sided 01/06/2019   Depression    takes Effexor daily   Diverticulosis    Gallstones    GERD (gastroesophageal reflux disease)    takes Omeprazole daily   H/O: pituitary tumor    History of blood transfusion     no abnormal reaction noted   History of bronchitis    last time less than a yr ago   History of colon polyps    Hx of colonic polyps 01/10/2019   Hyperlipidemia    takes Pravastatin daily   Hypertension    takes Amlodipine and Lisinopril daily   Hypothyroidism    takes synthroid and cytomel daily   IBD (inflammatory bowel disease) UC vs Crohn's 01/06/2019   Insomnia    takes Xanax nightly   Joint pain    Pneumonia    hx of;last time about 16yrs ago   PONV (postoperative nausea and vomiting)    Shortness of breath    with exertion   Weakness    left leg   Past Surgical History:  Procedure Laterality Date   [place]   epidural injections     EYE SURGERY     at age 74;vision correction   JOINT REPLACEMENT  2010   right hip   LUMBAR LAMINECTOMY/DECOMPRESSION MICRODISCECTOMY Left 03/18/2013   Procedure: LEFT LUMBAR FOUR-FIVE,LUMBAR FIVE-SACRAL ONE MICRODISCECTOMY;  Surgeon: Faythe Ghee, MD;  Location: [facility];  Service: Neurosurgery;  Laterality: Left;  left   partial removal of right kidney  1971  pituitary tumor removed  1983   POLYPECTOMY     SHOULDER SURGERY Right      A IV Location/Drains/Wounds Patient Lines/Drains/Airways Status     Active Line/Drains/Airways     Name Placement date Placement time Site Days   Peripheral IV 08/17/22 20 G Left Antecubital 08/17/22  1529  Antecubital  less than 1            Intake/Output Last 24 hours No intake or output data in the 24 hours ending 08/17/22 1540  Labs/Imaging Results for orders placed or performed during the hospital encounter of 08/17/22 (from the past 48 hour(s))  CBC with Differential     Status: Abnormal   Collection Time: 08/17/22  1:59 PM  Result Value Ref Range   WBC 11.3 (H) 4.0 - 10.5 K/uL   RBC 4.22 3.87 - 5.11 MIL/uL   Hemoglobin 11.7 (L) 12.0 - 15.0 g/dL   HCT 36.9  36.0 - 46.0 %   MCV 87.4 80.0 - 100.0 fL   MCH 27.7 26.0 - 34.0 pg   MCHC 31.7 30.0 - 36.0 g/dL   RDW 13.8 11.5 - 15.5 %   Platelets 338 150 - 400 K/uL   nRBC 0.0 0.0 - 0.2 %   Neutrophils Relative % 78 %   Neutro Abs 9.0 (H) 1.7 - 7.7 K/uL   Lymphocytes Relative 10 %   Lymphs Abs 1.1 0.7 - 4.0 K/uL   Monocytes Relative 8 %   Monocytes Absolute 0.9 0.1 - 1.0 K/uL   Eosinophils Relative 2 %   Eosinophils Absolute 0.2 0.0 - 0.5 K/uL   Basophils Relative 1 %   Basophils Absolute 0.1 0.0 - 0.1 K/uL   Immature Granulocytes 1 %   Abs Immature Granulocytes 0.06 0.00 - 0.07 K/uL    Comment: Performed at [facility], [place] [address], [place], [place] [address]  Comprehensive metabolic panel     Status: Abnormal   Collection Time: 08/17/22  1:59 PM  Result Value Ref Range   Sodium 133 (L) 135 - 145 mmol/L   Potassium 3.6 3.5 - 5.1 mmol/L   Chloride 94 (L) 98 - 111 mmol/L   CO2 27 22 - 32 mmol/L   Glucose, Bld 107 (H) 70 - 99 mg/dL    Comment: Glucose reference range applies only to samples taken after fasting for at least 8 hours.   BUN 22 8 - 23 mg/dL   Creatinine, Ser 1.19 (H) 0.44 - 1.00 mg/dL   Calcium 9.3 8.9 - 10.3 mg/dL   Total Protein 7.9 6.5 - 8.1 g/dL   Albumin 3.0 (L) 3.5 - 5.0 g/dL   AST 45 (H) 15 - 41 U/L   ALT 53 (H) 0 - 44 U/L   Alkaline Phosphatase 133 (H) 38 - 126 U/L   Total Bilirubin 0.4 0.3 - 1.2 mg/dL   GFR, Estimated 50 (L) >60 mL/min    Comment: (NOTE) Calculated using the CKD-EPI Creatinine Equation (2021)    Anion gap 12 5 - 15    Comment: Performed at [facility], [place] [address] [place], [place] [address]  Lipase, blood     Status: None   Collection Time: 08/17/22  1:59 PM  Result Value Ref Range   Lipase 30 11 - 51 U/L    Comment: Performed at [facility], [place] [address] [place],  [address]   CT Abdomen Pelvis W Contrast  Result Date: 08/16/2022 CLINICAL DATA:  Abdominal pain.  History of Crohn's disease. Evaluate for abscess. EXAM: CT ABDOMEN AND PELVIS WITH CONTRAST TECHNIQUE: Multidetector CT imaging of the abdomen and pelvis was performed using the standard protocol following bolus administration of intravenous contrast. RADIATION DOSE REDUCTION: This exam was performed according to the departmental dose-optimization program which includes automated exposure control, adjustment of the mA and/or kV according to patient size and/or use of iterative reconstruction technique. CONTRAST:  126mL OMNIPAQUE IOHEXOL 300 MG/ML  SOLN COMPARISON:  Abdominopelvic CT 11/25/2019. FINDINGS: Lower chest: Clear lung bases. No significant pleural or pericardial effusion. Hepatobiliary: The liver is normal in density without suspicious focal abnormality. No significant biliary dilatation status post cholecystectomy. Pancreas: Unremarkable. No pancreatic ductal dilatation or surrounding inflammatory changes. Spleen: Normal in size without focal abnormality. Adrenals/Urinary Tract: Both adrenal glands appear normal. No evidence of urinary tract calculus, suspicious renal lesion or hydronephrosis. No definite bladder abnormalities are identified. Bladder assessment limited by incomplete distension and artifact from the patient's right total hip arthroplasty. Stomach/Bowel: Enteric contrast has passed into the mid small bowel, but not traversed the terminal ileum or ileocecal valve. The stomach appears unremarkable for its degree of distention. The proximal small bowel is normal in caliber, without wall thickening or surrounding inflammation. There is mild fecalization of the distal small bowel without definite small bowel wall thickening or distention. The appendix is not visualized. There is prominent stool throughout the colon. The proximal to mid colon is normal caliber without wall thickening. However, there is moderate wall thickening throughout the sigmoid colon with surrounding inflammatory changes.  Probable phlegmon in the sigmoid mesocolon measuring approximately 4.3 x 3.5 cm on image 69/2. No drainable fluid collection identified. A small fistula in this area cannot be excluded. There is no evidence of fistula to the bladder, vagina or skin. Underlying sigmoid diverticulosis as correlated with previous study. Vascular/Lymphatic: There are no enlarged abdominal or pelvic lymph nodes. Aortic and branch vessel atherosclerosis without evidence of aneurysm or large vessel occlusion. Reproductive: The uterus and ovaries appear unremarkable. No evidence of adnexal mass. Other: Stable small umbilical hernia containing only fat. As above, pelvic inflammatory changes surrounding the sigmoid colon. No ascites or free intraperitoneal air. Musculoskeletal: No acute or significant osseous findings. Multilevel thoracolumbar spondylosis. Previous right total hip arthroplasty. IMPRESSION: 1. Wall thickening of the sigmoid colon with surrounding inflammatory changes and probable phlegmon in the sigmoid mesocolon, consistent with acute inflammation. There is underlying sigmoid diverticulosis, and findings could be secondary to diverticulitis. Given the patient's history, active Crohn's colitis not excluded. No other signs of active bowel inflammation. No drainable fluid collection identified. 2. No evidence of fistula to the bladder, vagina or skin. 3. Prominent stool throughout the colon with mild fecalization of the distal small bowel. No evidence of bowel obstruction. 4.  Aortic Atherosclerosis (ICD10-I70.0). 5. These results will be called to the ordering clinician or representative by the Radiologist Assistant, and communication documented in the PACS or [employer]. Electronically Signed   By: Richardean Sale M.D.   On: 08/16/2022 16:31    Pending Labs Unresulted Labs (From admission, onward)     Start     Ordered   08/18/22 0500  HIV Antibody (routine testing w rflx)  (HIV Antibody (Routine testing w reflex)  panel)  Tomorrow morning,   R        08/17/22 1454   08/18/22 0500  CBC  Tomorrow morning,   R        08/17/22 1454   08/18/22 0500  Comprehensive  metabolic panel  Tomorrow morning,   R        08/17/22 1454            Vitals/Pain Today's Vitals   08/17/22 1342 08/17/22 1343  BP:  (!) 168/88  Pulse:  (!) 108  Resp:  19  Temp:  97.7 F (36.5 C)  TempSrc:  Oral  SpO2:  97%  PainSc: 8      Isolation Precautions No active isolations  Medications Medications  metroNIDAZOLE (FLAGYL) IVPB 500 mg (500 mg Intravenous New Bag/Given 08/17/22 1529)  ciprofloxacin (CIPRO) IVPB 400 mg (has no administration in time range)  ondansetron (ZOFRAN) tablet 4 mg (has no administration in time range)    Or  ondansetron (ZOFRAN) injection 4 mg (has no administration in time range)  HYDROmorphone (DILAUDID) injection 0.5 mg (has no administration in time range)  hyoscyamine (LEVSIN SL) SL tablet 0.25 mg (has no administration in time range)  ondansetron (ZOFRAN) injection 4 mg (4 mg Intravenous Given 08/17/22 1529)  sodium chloride 0.9 % bolus 1,000 mL (1,000 mLs Intravenous New Bag/Given 08/17/22 1530)    Mobility walks     Focused Assessments Abdominal pain    R Recommendations: See Admitting Provider Note  Report given to:   Additional Notes: .

## 2022-08-17 NOTE — ED Provider Triage Note (Signed)
Emergency Medicine Provider Triage Evaluation Note  Whitney Higgins , a 67 y.o. female  was evaluated in triage.  Pt complains of diffuse abdominal pain really over the last month.  Patient has a history of Crohn's.  She was seen by her gastroenterologist 2 days ago where she had blood work and had a CT scan yesterday which revealed some diffuse sigmoid wall thickening which could be an acute Crohn's flare versus diverticulitis according to the report.  She was sent to the ER for IV antibiotics and admission from her GI doctor.  She denies any nausea or vomiting.  No rectal bleeding in the last 2 or 3 days.  She has been alternating between constipation and diarrhea.  Review of Systems  Positive:  Negative: See above   Physical Exam  BP (!) 168/88 (BP Location: Right Arm) Comment: Simultaneous filing. User may not have seen previous data.  Pulse (!) 108 Comment: Simultaneous filing. User may not have seen previous data.  Temp 97.7 F (36.5 C) (Oral) Comment: Simultaneous filing. User may not have seen previous data.  Resp 19 Comment: Simultaneous filing. User may not have seen previous data.  SpO2 97% Comment: Simultaneous filing. User may not have seen previous data. Gen:   Awake, no distress   Resp:  Normal effort  MSK:   Moves extremities without difficulty  Other:    Medical Decision Making  Medically screening exam initiated at 1:48 PM.  Appropriate orders placed.  Khamyah Stohr was informed that the remainder of the evaluation will be completed by another provider, this initial triage assessment does not replace that evaluation, and the importance of remaining in the ED until their evaluation is complete.     Myna Bright [place], [place] 08/17/22 1349

## 2022-08-17 NOTE — Progress Notes (Signed)
Pt has vomited 3 times since coming to floor at 1640. RN notified [facility] for phenergan order due to pt has had zofran 4mg  IV at 1529 in ED and can only have zofran Q6h. RN is waiting for providers response to page.

## 2022-08-17 NOTE — H&P (Signed)
History and Physical    Patient: Whitney Higgins [id number] DOB: September 03, 1955 DOA: 08/17/2022 DOS: the patient was seen and examined on 08/17/2022 PCP: Gara Kroner, DO  Patient coming from: Home  Chief Complaint:  Chief Complaint  Patient presents with   Abdominal Pain   HPI: Whitney Higgins is a 67 y.o. female with medical history significant of seasonal allergies, anxiety/depression, osteoarthritis, asthma, cataracts, chronic back pain, COVID-19, gallstones, GERD, pituitary tumor, bronchitis, hyperlipidemia hypertension, hypothyroidism colon polyps, diverticulosis, inflammatory bowel disease who was referred by her gastroenterologist to the emergency department due to abdominal pain which has been associated with some nausea, occasional emesis and episodes of diarrhea for the past 2 months and abnormal CT scan results.  No constipation, melena, but had some episodes of mild hematochezia February.  She has had chills, but no fever or night sweats. No sore throat, rhinorrhea, dyspnea, wheezing or hemoptysis.  No chest pain, palpitations, diaphoresis, PND, orthopnea or pitting edema of the lower extremities.  No flank pain, dysuria, frequency or hematuria.  No polyuria, polydipsia, polyphagia or blurred vision.  Lab work: CBC showed a white count of 11.3 with 78% neutrophils, hemoglobin 11.7 g/dL platelets 338.  Lipase was 30 units/L.  CMP shows sodium 133, potassium 3.6, chloride 94 and CO2 27 mmol/L.  Glucose 107, BUN 22, creatinine 1.19 mg/dL.  Normal total protein, albumin 3.0 g/dL.  AST 45, ALT 53 alkaline phosphatase 133 units/L.  Lipase was normal.  Imaging: CT abdomen/pelvis with contrast showed wall thickening of the sigmoid colon with surrounding inflammatory changes and probably phlegmon in the sigmoid mesocolon consistent with acute inflammation.  There is underlying sigmoid diverticulosis and findings could be secondary to diverticulitis.  Crohn's colitis could not be excluded.  There  was prominent stool throughout the colon with mild focalization of the distal small bowel.  Aortic atherosclerosis.  ED course: Initial vital signs were temperature 97.7 F, pulse 109, respiration 19, BP 168/88 mmHg O2 sat 97% on room air.  The patient received ondansetron 4 mg IVP, Normal Saline 1000 mL bolus, ciprofloxacin 400 mg IVPB and metronidazole 500 mg IVPB.   Review of Systems: As mentioned in the history of present illness. All other systems reviewed and are negative. Past Medical History:  Diagnosis Date   Abnormal alkaline phosphatase test 04/07/2019   Allergy    Anxiety    takes Xanax daily   Arthritis    Asthma    inhaler prn and Singulair daily;OTC Zyrtec daily   Cataract    Chronic back pain    stenosis   COVID-19 05/2019   Crohn's colitis ([place]) - left-sided 01/06/2019   Depression    takes Effexor daily   Diverticulosis    Gallstones    GERD (gastroesophageal reflux disease)    takes Omeprazole daily   H/O: pituitary tumor    History of blood transfusion    no abnormal reaction noted   History of bronchitis    last time less than a yr ago   History of colon polyps    Hx of colonic polyps 01/10/2019   Hyperlipidemia    takes Pravastatin daily   Hypertension    takes Amlodipine and Lisinopril daily   Hypothyroidism    takes synthroid and cytomel daily   IBD (inflammatory bowel disease) UC vs Crohn's 01/06/2019   Insomnia    takes Xanax nightly   Joint pain    Pneumonia    hx of;last time about 31yrs ago   PONV (postoperative nausea and  vomiting)    Shortness of breath    with exertion   Weakness    left leg   Past Surgical History:  Procedure Laterality Date   [place] AND CURETTAGE OF UTERUS  1979   epidural injections     EYE SURGERY     at age 65;vision correction   JOINT REPLACEMENT  2010   right hip   LUMBAR LAMINECTOMY/DECOMPRESSION MICRODISCECTOMY Left  03/18/2013   Procedure: LEFT LUMBAR FOUR-FIVE,LUMBAR FIVE-SACRAL ONE MICRODISCECTOMY;  Surgeon: Faythe Ghee, MD;  Location: [facility];  Service: Neurosurgery;  Laterality: Left;  left   partial removal of right kidney  1971   pituitary tumor removed  1983   POLYPECTOMY     SHOULDER SURGERY Right    Social History:  reports that she has never smoked. She has never used smokeless tobacco. She reports current alcohol use. She reports that she does not use drugs.  Allergies  Allergen Reactions   Codeine Nausea And Vomiting   Clarithromycin Rash   Dicyclomine Hcl     Dizzy, and headache   Penicillins Rash    Family History  Problem Relation Age of Onset   Asthma Mother    Allergic rhinitis Mother    Allergic rhinitis Father    Allergic rhinitis Sister    Allergic rhinitis Brother    Colon cancer Neg Hx    Esophageal cancer Neg Hx    Liver cancer Neg Hx    Pancreatic cancer Neg Hx    Rectal cancer Neg Hx    Stomach cancer Neg Hx    Colon polyps Neg Hx     Prior to Admission medications   Medication Sig Start Date End Date Taking? Authorizing Provider  acetaminophen (TYLENOL) 500 MG tablet Take 1,000 mg by mouth every 6 (six) hours as needed for pain.    [provider]  albuterol (PROVENTIL HFA;VENTOLIN HFA) 108 (90 BASE) MCG/ACT inhaler Inhale 2 puffs into the lungs every 6 (six) hours as needed for wheezing.    [provider]  albuterol (PROVENTIL) (2.5 MG/3ML) 0.083% nebulizer solution  04/18/16   [provider]  ALPRAZolam (XANAX) 0.5 MG tablet Take 0.25 mg by mouth at bedtime as needed for sleep.    [provider]  amLODipine (NORVASC) 5 MG tablet Take 5 mg by mouth daily.    [provider]  atorvastatin (LIPITOR) 20 MG tablet Take 20 mg by mouth daily.    [provider]  cetirizine (ZYRTEC) 10 MG tablet Take 10 mg by mouth daily. Patient not taking: Reported on 08/15/2022    [provider]   clotrimazole (LOTRIMIN) 1 % cream  08/29/18   [provider]  Cyanocobalamin (B12 LIQUID HEALTH BOOSTER PO) Take 5,000 mcg by mouth daily.    [provider]  fluticasone Asencion Islam) 50 MCG/ACT nasal spray  02/10/19   [provider]  hyoscyamine (LEVSIN SL) 0.125 MG SL tablet Place 1 tablet (0.125 mg total) under the tongue every 4 (four) hours as needed. 08/15/22   Gatha Mayer, MD  levothyroxine (SYNTHROID) 75 MCG tablet Take 75 mcg by mouth daily. 11/06/18   [provider]  LIALDA 1.2 g EC tablet TAKE TWO TABLETS BY MOUTH TWICE A DAY Patient taking differently: Take 1.2 g by mouth 2 (two) times daily. 04/27/22   Gatha Mayer, MD  liothyronine (  CYTOMEL) 5 MCG tablet TAKE 1 TABLET TWICE A DAY 30 MINUTES BEFORE A MEAL 12/04/18   [provider]  lisinopril-hydrochlorothiazide (PRINZIDE,ZESTORETIC) 20-25 MG per tablet Take 1 tablet by mouth daily.    [provider]  montelukast (SINGULAIR) 10 MG tablet Take 10 mg by mouth daily.    [provider]  naproxen (NAPROSYN) 500 MG tablet Take 500 mg by mouth daily as needed. 01/25/19   [provider]  omeprazole (PRILOSEC) 20 MG capsule Take 20 mg by mouth as needed (for reflux).    [provider]  ondansetron (ZOFRAN) 4 MG tablet Take 1 tablet (4 mg total) by mouth every 8 (eight) hours as needed for nausea or vomiting. 08/15/22   Gatha Mayer, MD  potassium chloride (KLOR-CON) 10 MEQ tablet TAKE 1 TABLET BY MOUTH EVERY DAY 10/29/15   [provider]  venlafaxine XR (EFFEXOR-XR) 150 MG 24 hr capsule Take 150 mg by mouth 2 (two) times daily.    [provider]  Vitamin D, Ergocalciferol, (DRISDOL) 1.25 MG (50000 UNIT) CAPS capsule Take 50,000 Units by mouth every 7 (seven) days. 04/17/22   [provider]    Physical Exam: Vitals:   08/17/22 1343  BP: (!) 168/88  Pulse: (!) 108  Resp: 19  Temp: 97.7 F (36.5 C)  TempSrc: Oral  SpO2: 97%    Physical Exam Constitutional:      General: She is awake. She is not in acute distress.    Appearance: She is obese.  HENT:     Head: Normocephalic.     Nose: No rhinorrhea.     Mouth/Throat:     Mouth: Mucous membranes are moist.  Eyes:     General: No scleral icterus.    Pupils: Pupils are equal, round, and reactive to light.  Neck:     Vascular: No JVD.  Cardiovascular:     Rate and Rhythm: Normal rate and regular rhythm.     Heart sounds: S1 normal and S2 normal.  Pulmonary:     Effort: Pulmonary effort is normal.     Breath sounds: Normal breath sounds. No wheezing, rhonchi or rales.  Abdominal:     General: Bowel sounds are normal. There is no distension.     Palpations: Abdomen is soft.     Tenderness: There is abdominal tenderness. There is no right CVA tenderness, left CVA tenderness, guarding or rebound.  Musculoskeletal:     Cervical back: Neck supple.     Right lower leg: No edema.     Left lower leg: No edema.  Skin:    General: Skin is warm and dry.  Neurological:     General: No focal deficit present.     Mental Status: She is alert and oriented to person, place, and time.  Psychiatric:        Mood and Affect: Mood normal.        Behavior: Behavior normal. Behavior is cooperative.   Data Reviewed:  Results are pending, will review when available.  Assessment and Plan: Principal Problem:   Acute diverticulitis Superimposed on:   Crohn's colitis ([place]) - left-sided Observation/MedSurg. Continue IV fluids. Keep n.p.o. for now. Analgesics as needed. Antiemetics as needed. Pantoprazole 40 mg IVP daily. Continue ciprofloxacin 400 mg IVPB BID. Continue metronidazole 500 mg IVPB BID. Follow CBC, CMP and lipase in AM. [facility] has been consulted.  Active Problems:   Obesity, Class III, BMI 40-49.9 (morbid obesity) Current BMI 46.37 kg/m. OP follow-up  with primary care provider.    Asthma Asymptomatic at this time. Continue montelukast 10 mg  p.o. at bedtime. Supplemental oxygen and/or bronchodilators as needed.    Chronic anxiety   Major depressive disorder Continue venlafaxine 150 mg p.o. twice daily.    Essential hypertension Continue amlodipine 10 mg p.o. daily. Continue pain control. Monitor blood pressure.    Hypercholesterolemia Resume atorvastatin once med rec done.    Abnormal alkaline phosphatase test   Transaminitis  Follow-up LFTs in AM.     Advance Care Planning:   Code Status: Full Code   Consults: [facility].  Family Communication:   Severity of Illness: The appropriate patient status for this patient is INPATIENT. Inpatient status is judged to be reasonable and necessary in order to provide the required intensity of service to ensure the patient's safety. The patient's presenting symptoms, physical exam findings, and initial radiographic and laboratory data in the context of their chronic comorbidities is felt to place them at high risk for further clinical deterioration. Furthermore, it is not anticipated that the patient will be medically stable for discharge from the hospital within 2 midnights of admission.   * I certify that at the point of admission it is my clinical judgment that the patient will require inpatient hospital care spanning beyond 2 midnights from the point of admission due to high intensity of service, high risk for further deterioration and high frequency of surveillance required.*  Author: Reubin Milan, MD 08/17/2022 2:55 PM  For on call review [email or web address].   Marland Kitchendgi

## 2022-08-18 DIAGNOSIS — R748 Abnormal levels of other serum enzymes: Secondary | ICD-10-CM

## 2022-08-18 DIAGNOSIS — R109 Unspecified abdominal pain: Secondary | ICD-10-CM | POA: Diagnosis not present

## 2022-08-18 DIAGNOSIS — D649 Anemia, unspecified: Secondary | ICD-10-CM

## 2022-08-18 DIAGNOSIS — K625 Hemorrhage of anus and rectum: Secondary | ICD-10-CM

## 2022-08-18 DIAGNOSIS — E785 Hyperlipidemia, unspecified: Secondary | ICD-10-CM

## 2022-08-18 DIAGNOSIS — R112 Nausea with vomiting, unspecified: Secondary | ICD-10-CM

## 2022-08-18 DIAGNOSIS — K5792 Diverticulitis of intestine, part unspecified, without perforation or abscess without bleeding: Secondary | ICD-10-CM | POA: Diagnosis not present

## 2022-08-18 DIAGNOSIS — F418 Other specified anxiety disorders: Secondary | ICD-10-CM

## 2022-08-18 DIAGNOSIS — I10 Essential (primary) hypertension: Secondary | ICD-10-CM

## 2022-08-18 DIAGNOSIS — Z86010 Personal history of colonic polyps: Secondary | ICD-10-CM

## 2022-08-18 DIAGNOSIS — R194 Change in bowel habit: Secondary | ICD-10-CM | POA: Diagnosis not present

## 2022-08-18 DIAGNOSIS — E669 Obesity, unspecified: Secondary | ICD-10-CM

## 2022-08-18 DIAGNOSIS — E039 Hypothyroidism, unspecified: Secondary | ICD-10-CM

## 2022-08-18 LAB — COMPREHENSIVE METABOLIC PANEL
ALT: 44 U/L (ref 0–44)
AST: 32 U/L (ref 15–41)
Albumin: 2.6 g/dL — ABNORMAL LOW (ref 3.5–5.0)
Alkaline Phosphatase: 102 U/L (ref 38–126)
Anion gap: 9 (ref 5–15)
BUN: 17 mg/dL (ref 8–23)
CO2: 28 mmol/L (ref 22–32)
Calcium: 8.9 mg/dL (ref 8.9–10.3)
Chloride: 97 mmol/L — ABNORMAL LOW (ref 98–111)
Creatinine, Ser: 0.92 mg/dL (ref 0.44–1.00)
GFR, Estimated: >60 mL/min (ref >60)
Glucose, Bld: 100 mg/dL — ABNORMAL HIGH (ref 70–99)
Potassium: 3.4 mmol/L — ABNORMAL LOW (ref 3.5–5.1)
Sodium: 134 mmol/L — ABNORMAL LOW (ref 135–145)
Total Bilirubin: 0.5 mg/dL (ref 0.3–1.2)
Total Protein: 6.9 g/dL (ref 6.5–8.1)

## 2022-08-18 LAB — CBC
HCT: 33.1 % — ABNORMAL LOW (ref 36.0–46.0)
Hemoglobin: 10.4 g/dL — ABNORMAL LOW (ref 12.0–15.0)
MCH: 27.7 pg (ref 26.0–34.0)
MCHC: 31.4 g/dL (ref 30.0–36.0)
MCV: 88.3 fL (ref 80.0–100.0)
Platelets: 280 10*3/uL (ref 150–400)
RBC: 3.75 MIL/uL — ABNORMAL LOW (ref 3.87–5.11)
RDW: 13.9 % (ref 11.5–15.5)
WBC: 7.3 10*3/uL (ref 4.0–10.5)
nRBC: 0 % (ref 0.0–0.2)

## 2022-08-18 LAB — HIV ANTIBODY (ROUTINE TESTING W REFLEX): HIV Screen 4th Generation wRfx: NONREACTIVE

## 2022-08-18 MED ORDER — MESALAMINE 1.2 G PO TBEC
2.4000 g | DELAYED_RELEASE_TABLET | Freq: Two times a day (BID) | ORAL | Status: DC
  Administered 2022-08-18 – 2022-08-23 (×11): 2.4 g via ORAL
  Filled 2022-08-18 (×12): qty 2

## 2022-08-18 MED ORDER — MORPHINE SULFATE (PF) 2 MG/ML IV SOLN
2.0000 mg | INTRAVENOUS | Status: DC | PRN
  Administered 2022-08-18 – 2022-08-29 (×24): 2 mg via INTRAVENOUS
  Filled 2022-08-18 (×25): qty 1

## 2022-08-18 MED ORDER — POLYETHYLENE GLYCOL 3350 17 G PO PACK
17.0000 g | PACK | Freq: Every day | ORAL | Status: DC
  Administered 2022-08-19 – 2022-08-23 (×5): 17 g via ORAL
  Filled 2022-08-18 (×5): qty 1

## 2022-08-18 MED ORDER — SODIUM CHLORIDE 0.9 % IV SOLN: INTRAVENOUS | Status: AC

## 2022-08-18 MED ORDER — ACETAMINOPHEN 325 MG PO TABS
650.0000 mg | ORAL_TABLET | Freq: Four times a day (QID) | ORAL | Status: DC | PRN
  Administered 2022-08-19 – 2022-08-23 (×3): 650 mg via ORAL
  Filled 2022-08-18 (×4): qty 2

## 2022-08-18 MED ORDER — PROCHLORPERAZINE EDISYLATE 10 MG/2ML IJ SOLN
5.0000 mg | INTRAMUSCULAR | Status: DC | PRN
  Administered 2022-08-18 – 2022-09-03 (×6): 5 mg via INTRAVENOUS
  Filled 2022-08-18 (×6): qty 2

## 2022-08-18 NOTE — Consult Note (Addendum)
Consultation  Referring Provider: [facility], Dr. Robb Matar Primary Care Physician:  Montez Hageman, DO Primary Gastroenterologist:  Dr. Leone Payor  Reason for Consultation: Abdominal pain, rule out acute diverticulitis with phlegmon versus exacerbation of Crohn's colitis with phlegmon  HPI: Whitney Higgins is a 67 y.o. female known to Dr. Leone Payor who had been seen in our office earlier this week per Dr. Leone Payor who ordered CT, then subsequent admission. Patient has history of Crohn's colitis left-sided, known diverticulosis, history of sessile serrated polyps, hypertension, hypothyroidism, hyperlipidemia/anxiety and depression and is status post multiple abdominal surgeries including cholecystectomy appendectomy and C-section. Prior to the office visit earlier this week patient had not been seen for about a year and had been maintained on Lialda 2.4 g twice daily for left-sided colitis. Patient relates that she has not been feeling well over the past couple of months had developed change in bowel habits with intermittent loose stools, some obstipation, and then started noticing intermittent blood in her stool.  She says she did have a period of time where she had 4 days of blood every time she had a bowel movement.  She is also been having lower abdominal discomfort/cramping and more recently feels as if she is not passing as much stool.  Appetite has been fair, she started having intermittent nausea and vomiting a few weeks ago and no hematemesis.  She has also been having some intermittent chills and hot spells but did not document a temp at home. She has been taking naproxen 500 mg twice daily alternating with Tylenol for knee pain over the past few months.  Her last colonoscopy was done in November 2020 with removal of 2 small polyps in the transverse colon and noted severe sigmoid diverticulosis with narrowing but no evidence of active colitis at that time.  Random biopsies were done which showed mildly  active chronic colitis with rare microgranulomas, the polyps were hyperplastic.  Left colon biopsies at that time showed no active inflammation or features of chronicity.  She had undergone enterography in July 2021 which did not show any abnormal enhancement of the small bowel or colon.  CT of the abdomen pelvis done on 08/16/2022 shows wall thickening of the sigmoid colon with surrounding inflammatory changes and probable phlegmon in the sigmoid mesocolon consistent with acute inflammation there is underlying sigmoid diverticulosis and findings could be secondary to diverticulitis versus colitis, no drainable fluid collection, no evidence of fistula to the bladder or vagina or skin could not exclude small fistula area of active inflammation, prominent stool throughout the colon with mild fecalization of the distal small bowel no evidence of bowel obstruction, aortic atherosclerosis.  Labs yesterday on admit with WBC 11.3, hemoglobin 11.7, hematocrit 36.9 Sodium 133, potassium 3.6, BUN 22, creatinine 1.19 T. bili 0.4, alk phos 133, AST 53, AST 45 BUN 3 Sed rate 99, CRP 16.5  Patient has been started on IV Cipro and IV Flagyl She says she did have a couple of episodes of nausea and vomiting yesterday after trying to eat. She was eating breakfast during our interview with full liquids says she is hungry and feels less nauseated. Abdominal pain is about the same but she has not had a bowel movement over the past 3 days.   Past Medical History:  Diagnosis Date   Abnormal alkaline phosphatase test 04/07/2019   Allergy    Anxiety    takes Xanax daily   Arthritis    Asthma    inhaler prn and Singulair daily;OTC  Zyrtec daily   Cataract    Chronic back pain    stenosis   COVID-19 05/2019   Crohn's colitis (HCC) - left-sided 01/06/2019   Depression    takes Effexor daily   Diverticulosis    Gallstones    GERD (gastroesophageal reflux disease)    takes Omeprazole daily   H/O: pituitary tumor     History of blood transfusion    no abnormal reaction noted   History of bronchitis    last time less than a yr ago   History of colon polyps    Hx of colonic polyps 01/10/2019   Hyperlipidemia    takes Pravastatin daily   Hypertension    takes Amlodipine and Lisinopril daily   Hypothyroidism    takes synthroid and cytomel daily   IBD (inflammatory bowel disease) UC vs Crohn's 01/06/2019   Insomnia    takes Xanax nightly   Joint pain    Pneumonia    hx of;last time about 6528yrs ago   PONV (postoperative nausea and vomiting)    Shortness of breath    with exertion   Weakness    left leg    Past Surgical History:  Procedure Laterality Date   APPENDECTOMY  1974   CESAREAN SECTION  1981   CHOLECYSTECTOMY     COLONOSCOPY     DILATION AND CURETTAGE OF UTERUS  1979   epidural injections     EYE SURGERY     at age 67;vision correction   JOINT REPLACEMENT  2010   right hip   LUMBAR LAMINECTOMY/DECOMPRESSION MICRODISCECTOMY Left 03/18/2013   Procedure: LEFT LUMBAR FOUR-FIVE,LUMBAR FIVE-SACRAL ONE MICRODISCECTOMY;  Surgeon: Reinaldo Meekerandy O Kritzer, MD;  Location: [facility];  Service: Neurosurgery;  Laterality: Left;  left   partial removal of right kidney  1971   pituitary tumor removed  1983   POLYPECTOMY     SHOULDER SURGERY Right     Prior to Admission medications   Medication Sig Start Date End Date Taking? Authorizing Provider  acetaminophen (TYLENOL) 500 MG tablet Take 1,000 mg by mouth every 6 (six) hours as needed for pain.    [provider]  albuterol (PROVENTIL HFA;VENTOLIN HFA) 108 (90 BASE) MCG/ACT inhaler Inhale 2 puffs into the lungs every 6 (six) hours as needed for wheezing.    [provider]  albuterol (PROVENTIL) (2.5 MG/3ML) 0.083% nebulizer solution  04/18/16   [provider]  ALPRAZolam (XANAX) 0.5 MG tablet Take 0.25 mg by mouth at bedtime as needed for sleep.    [provider]  amLODipine (NORVASC) 5 MG tablet Take 5 mg  by mouth daily.    [provider]  atorvastatin (LIPITOR) 20 MG tablet Take 20 mg by mouth daily.    [provider]  cetirizine (ZYRTEC) 10 MG tablet Take 10 mg by mouth daily. Patient not taking: Reported on 08/15/2022    [provider]  clotrimazole (LOTRIMIN) 1 % cream  08/29/18   [provider]  Cyanocobalamin (B12 LIQUID HEALTH BOOSTER PO) Take 5,000 mcg by mouth daily.    [provider]  fluticasone Aleda Grana(FLONASE) 50 MCG/ACT nasal spray  02/10/19   [provider]  hyoscyamine (LEVSIN SL) 0.125 MG SL tablet Place 1 tablet (0.125 mg total) under the tongue every 4 (four) hours as needed. 08/15/22   Iva BoopGessner, Carl E, MD  levothyroxine (SYNTHROID) 75 MCG tablet Take 75 mcg by mouth daily. 11/06/18   [provider]  LIALDA 1.2 g EC tablet  TAKE TWO TABLETS BY MOUTH TWICE A DAY Patient taking differently: Take 1.2 g by mouth 2 (two) times daily. 04/27/22   Iva Boop, MD  liothyronine (CYTOMEL) 5 MCG tablet TAKE 1 TABLET TWICE A DAY 30 MINUTES BEFORE A MEAL 12/04/18   [provider]  lisinopril-hydrochlorothiazide (PRINZIDE,ZESTORETIC) 20-25 MG per tablet Take 1 tablet by mouth daily.    [provider]  montelukast (SINGULAIR) 10 MG tablet Take 10 mg by mouth daily.    [provider]  naproxen (NAPROSYN) 500 MG tablet Take 500 mg by mouth daily as needed. 01/25/19   [provider]  omeprazole (PRILOSEC) 20 MG capsule Take 20 mg by mouth as needed (for reflux).    [provider]  ondansetron (ZOFRAN) 4 MG tablet Take 1 tablet (4 mg total) by mouth every 8 (eight) hours as needed for nausea or vomiting. 08/15/22   Iva Boop, MD  potassium chloride (KLOR-CON) 10 MEQ tablet TAKE 1 TABLET BY MOUTH EVERY DAY 10/29/15   [provider]  venlafaxine XR (EFFEXOR-XR) 150 MG 24 hr capsule Take 150 mg by mouth 2 (two) times daily.    [provider]  Vitamin D, Ergocalciferol,  (DRISDOL) 1.25 MG (50000 UNIT) CAPS capsule Take 50,000 Units by mouth every 7 (seven) days. 04/17/22   [provider]    Current Facility-Administered Medications  Medication Dose Route Frequency Provider Last Rate Last Admin   0.9 %  sodium chloride infusion   Intravenous Continuous Marinda Elk, MD       acetaminophen (TYLENOL) tablet 650 mg  650 mg Oral Q6H PRN Marinda Elk, MD       amLODipine (NORVASC) tablet 5 mg  5 mg Oral Daily Bobette Mo, MD       ciprofloxacin (CIPRO) IVPB 400 mg  400 mg Intravenous Q12H Bobette Mo, MD 200 mL/hr at 08/18/22 0623 400 mg at 08/18/22 8295   hyoscyamine (LEVSIN SL) SL tablet 0.25 mg  0.25 mg Sublingual Q4H PRN Bobette Mo, MD       levothyroxine (SYNTHROID) tablet 75 mcg  75 mcg Oral QAC breakfast Bobette Mo, MD   75 mcg at 08/18/22 6213   metroNIDAZOLE (FLAGYL) IVPB 500 mg  500 mg Intravenous Q12H Bobette Mo, MD 100 mL/hr at 08/18/22 0229 500 mg at 08/18/22 0229   montelukast (SINGULAIR) tablet 10 mg  10 mg Oral QHS Bobette Mo, MD   10 mg at 08/17/22 2216   morphine (PF) 2 MG/ML injection 2 mg  2 mg Intravenous Q2H PRN Marinda Elk, MD       ondansetron [facility]) tablet 4 mg  4 mg Oral Q6H PRN Bobette Mo, MD   4 mg at 08/18/22 0865   Or   ondansetron [facility]) injection 4 mg  4 mg Intravenous Q6H PRN Bobette Mo, MD   4 mg at 08/17/22 2216   pantoprazole (PROTONIX) injection 40 mg  40 mg Intravenous Q24H Bobette Mo, MD   40 mg at 08/17/22 1825   venlafaxine XR (EFFEXOR-XR) 24 hr capsule 150 mg  150 mg Oral BID Bobette Mo, MD   150 mg at 08/17/22 2216    Allergies as of 08/17/2022 - Review Complete 08/17/2022  Allergen Reaction Noted   Codeine Nausea And Vomiting 02/13/2013   Clarithromycin Rash 09/08/2014   Dicyclomine hcl  11/10/2019   Penicillins Rash 02/13/2013    Family History  Problem Relation Age of  Onset   Asthma Mother     Allergic rhinitis Mother    Allergic rhinitis Father    Allergic rhinitis Sister    Allergic rhinitis Brother    Colon cancer Neg Hx    Esophageal cancer Neg Hx    Liver cancer Neg Hx    Pancreatic cancer Neg Hx    Rectal cancer Neg Hx    Stomach cancer Neg Hx    Colon polyps Neg Hx     Social History   Socioeconomic History   Marital status: Married    Spouse name: Not on file   Number of children: 1   Years of education: Not on file   Highest education level: Not on file  Occupational History   Occupation: retired  Tobacco Use   Smoking status: Never   Smokeless tobacco: Never  Vaping Use   Vaping Use: Never used  Substance and Sexual Activity   Alcohol use: Yes    Comment: rarely red wine   Drug use: No   Sexual activity: Yes  Other Topics Concern   Not on file  Social History Narrative   Married, on Medicare prior to 69 question disabled   Rare red wine   Never smoker   No drug use   Social Determinants of Corporate investment banker Strain: Not on file  Food Insecurity: No Food Insecurity (08/17/2022)   Hunger Vital Sign    Worried About Running Out of Food in the Last Year: Never true    Ran Out of Food in the Last Year: Never true  Transportation Needs: No Transportation Needs (08/17/2022)   PRAPARE - Administrator, Civil Service (Medical): No    Lack of Transportation (Non-Medical): No  Physical Activity: Not on file  Stress: Not on file  Social Connections: Not on file  Intimate Partner Violence: Not At Risk (08/17/2022)   Humiliation, Afraid, Rape, and Kick questionnaire    Fear of Current or Ex-Partner: No    Emotionally Abused: No    Physically Abused: No    Sexually Abused: No    Review of Systems: Pertinent positive and negative review of systems were noted in the above HPI section.  All other review of systems was otherwise negative.   Physical Exam: Vital signs in last 24 hours: Temp:  [97.7 F (36.5 C)-98.6 F (37 C)]  98.4 F (36.9 C) (04/05 0559) Pulse Rate:  [76-108] 76 (04/05 0633) Resp:  [18-19] 18 (04/05 0559) BP: (105-168)/(60-88) 129/62 (04/05 0559) SpO2:  [87 %-100 %] 100 % (04/05 1610) Weight:  [122.5 kg] 122.5 kg (04/04 1744) Last BM Date : 08/15/22 General:   Alert, well-developed, well-nourished, older white female pleasant and cooperative in NAD-husband at bedside Head:  Normocephalic and atraumatic. Eyes:  Sclera clear, no icterus.   Conjunctiva pink. Ears:  Normal auditory acuity. Nose:  No deformity, discharge,  or lesions. Mouth:  No deformity or lesions.   Neck:  Supple; no masses or thyromegaly. Lungs:  Clear throughout to auscultation.   No wheezes, crackles, or rhonchi.  Heart:  Regular rate and rhythm; no murmurs, clicks, rubs,  or gallops. Abdomen:  Soft,BS active,, she is tender in the left mid left lower quadrant and suprapubic area no guarding or rebound nonpalp mass or hsm.  Old incisional scars Rectal: Not done Msk:  Symmetrical without gross deformities. . Pulses:  Normal pulses noted. Extremities:  Without clubbing or edema. Neurologic:  Alert and  oriented x4;  grossly normal  neurologically. Skin:  Intact without significant lesions or rashes.. Psych:  Alert and cooperative. Normal mood and affect.  Intake/Output from previous day: 04/04 0701 - 04/05 0700 In: 1585 [P.O.:285; IV Piggyback:1300] Out: -  Intake/Output this shift: No intake/output data recorded.  Lab Results: Recent Labs    08/15/22 1550 08/17/22 1359 08/18/22 0255  WBC 13.1* 11.3* 7.3  HGB 12.1 11.7* 10.4*  HCT 36.6 36.9 33.1*  PLT 355.0 338 280   BMET Recent Labs    08/15/22 1550 08/17/22 1359 08/18/22 0255  NA 133* 133* 134*  K 3.7 3.6 3.4*  CL 97 94* 97*  CO2 26 27 28   GLUCOSE 110* 107* 100*  BUN 23 22 17   CREATININE 0.99 1.19* 0.92  CALCIUM 9.9 9.3 8.9   LFT Recent Labs    08/18/22 0255  PROT 6.9  ALBUMIN 2.6*  AST 32  ALT 44  ALKPHOS 102  BILITOT 0.5     IMPRESSION:  81. 67 year old white female with history of left-sided Crohn's colitis, who has been maintained on Lialda.  She had self decreased her dose over the past couple of months due to onset of issues with nausea. Most recent imaging in 2021 had not shown any evidence of active Crohn's Most recent colonoscopy November 2020, also with no evidence of active colitis however random biopsies had shown mildly active chronic colitis with rare microgranuloma.  Patient has had onset of symptoms over the past 2 months, with change in bowel habits, intermittent loose stools alternating with constipation, intermittent rectal bleeding, lower abdominal discomfort and cramping, more recently with intermittent chills and hot spells and developed intermittent nausea and vomiting over the past 2 weeks.  Imaging shows a severe inflammatory process of the sigmoid and descending colon with surrounding inflammatory changes and probable phlegmon in the sigmoid mesocolon measuring 4.3 x 3.5 cm, also with prominent stool in the colon.  I suspect this is secondary to exacerbation of Crohn's rather than diverticulitis because of the more gradual onset of symptoms over the past couple of months.  2.  Hypertension 3.  Hypothyroidism 4.  Hyperlipidemia 5.  History of colon polyps previous sessile serrated polyps, most recent colonoscopy with hyperplastic polyps only 2020 5.  Anxiety/depression 6.  Obesity 7.  Status post cholecystectomy appendectomy and C-section 8.  Mild anemia secondary to #1  Plan:  Full liquid diet today, if she tolerates can advance to soft diet IV antiemetics as needed Continue IV Flagyl and IV Cipro-would anticipate continuing IV antibiotics through the weekend and then repeat CT abdomen and pelvis perhaps Monday to assess for improvement in the inflammatory process versus development of more discrete abscess Start MiraLAX 17 g in 8 ounces of water daily, gently purge her bowel She  will eventually need a repeat colonoscopy but not until inflammatory/infectious process has significantly improved Will restart the Lialda 2.4 g p.o. twice daily Daily PPI Fecal calprotectin She may benefit from IV steroids but hesitant to start yet until we see what follow-up CT shows Leave off NSAIDs GI will follow with you  Mike Gip, PA-C  08/18/2022, 8:48 AM     Attending Physician Note   I have taken a history, reviewed the chart and examined the patient. I performed a substantive portion of this encounter, including complete performance of at least one of the key components, in conjunction with the APP. I agree with the APP's note, impression and recommendations with my edits. My additional impressions and recommendations are as follows.   Crohn's  colitis involving the left colon with suspected phlegmon in the sigmoid mesocolon and prominent stool, less likely diverticulitis. Continue IV ciprofloxacin and IV metronidazole, antiemetics prn, Miralax qd to bid, restart Lialda, avoid NSAIDs, full liquid today, repeat CT AP on Monday to evaluate progress / potential abscess formation and no plans for corticosteroids right now with concern for active infection.   Claudette Head, MD [facility] See [facility], [facility], for our on call provider

## 2022-08-18 NOTE — Plan of Care (Signed)
  Problem: Coping: Goal: Level of anxiety will decrease Outcome: Progressing   Problem: Pain Managment: Goal: General experience of comfort will improve Outcome: Progressing   

## 2022-08-18 NOTE — Progress Notes (Signed)
[facility] PROGRESS NOTE    Progress Note  Whitney Higgins  UMP:[id number] DOB: 1955-11-25 DOA: 08/17/2022 PCP: Montez Hageman, DO     Brief Narrative:   Whitney Higgins is an 67 y.o. female past medical history significant for seasonal allergies anxiety asthma chronic back pain inflammatory bowel disease sent by the GI doctor to the emergency room for abdominal pain CT scan of the abdomen and pelvis showed inflammatory changes and probably phlegmon in the sigmoid mesocolon consistent with acute diverticulitis   Assessment/Plan:   Acute diverticulitis superimposed on Crohn's colitis: Started on IV fluids and empiric antibiotics. Ciprofloxacin and Flagyl, has remained afebrile and leukocytosis improved. Continue current diet Zofran and narcotics for analgesics.  Obesity, Class III, BMI 40-49.9 (morbid obesity) Noted, has been counseled.  Asthma: Currently asymptomatic continue current medications.  Chronic anxiety/major depression: Continue current meds.  Essential hypertension Continue amlodipine. Her blood pressure slightly elevated due to pain.  hypercholesterolemia Continue statins.  Abnormal alkaline phosphatase test/  Transaminitis Resolved likely due to acute infectious etiology.    DVT prophylaxis: lovenox Family Communication:none Status is: Inpatient Remains inpatient appropriate because: Acute diverticulitis    Code Status:     Code Status Orders  (From admission, onward)           Start     Ordered   08/17/22 1454  Full code  Continuous       Question:  By:  Answer:  Consent: discussion documented in EHR   08/17/22 1454           Code Status History     Date Active Date Inactive Code Status Order ID Comments User Context   03/18/2013 1447 03/19/2013 1305 Full Code [id number]  Kritzer, Rolanda Lundborg, MD Inpatient      Advance Directive Documentation    Flowsheet Row Most Recent Value  Type of Advance Directive Healthcare Power of  Attorney, Living will  Pre-existing out of facility DNR order (yellow form or pink MOST form) --  "MOST" Form in Place? --         IV Access:   Peripheral IV   Procedures and diagnostic studies:   CT Abdomen Pelvis W Contrast  Result Date: 08/16/2022 CLINICAL DATA:  Abdominal pain. History of Crohn's disease. Evaluate for abscess. EXAM: CT ABDOMEN AND PELVIS WITH CONTRAST TECHNIQUE: Multidetector CT imaging of the abdomen and pelvis was performed using the standard protocol following bolus administration of intravenous contrast. RADIATION DOSE REDUCTION: This exam was performed according to the departmental dose-optimization program which includes automated exposure control, adjustment of the mA and/or kV according to patient size and/or use of iterative reconstruction technique. CONTRAST:  OMNIPAQUE IOHEXOL 300 MG/ML  SOLN COMPARISON:  Abdominopelvic CT 11/25/2019. FINDINGS: Lower chest: Clear lung bases. No significant pleural or pericardial effusion. Hepatobiliary: The liver is normal in density without suspicious focal abnormality. No significant biliary dilatation status post cholecystectomy. Pancreas: Unremarkable. No pancreatic ductal dilatation or surrounding inflammatory changes. Spleen: Normal in size without focal abnormality. Adrenals/Urinary Tract: Both adrenal glands appear normal. No evidence of urinary tract calculus, suspicious renal lesion or hydronephrosis. No definite bladder abnormalities are identified. Bladder assessment limited by incomplete distension and artifact from the patient's right total hip arthroplasty. Stomach/Bowel: Enteric contrast has passed into the mid small bowel, but not traversed the terminal ileum or ileocecal valve. The stomach appears unremarkable for its degree of distention. The proximal small bowel is normal in caliber, without wall thickening or surrounding inflammation. There is mild fecalization of  the distal small bowel without definite  small bowel wall thickening or distention. The appendix is not visualized. There is prominent stool throughout the colon. The proximal to mid colon is normal caliber without wall thickening. However, there is moderate wall thickening throughout the sigmoid colon with surrounding inflammatory changes. Probable phlegmon in the sigmoid mesocolon measuring approximately 4.3 x 3.5 cm on image 69/2. No drainable fluid collection identified. A small fistula in this area cannot be excluded. There is no evidence of fistula to the bladder, vagina or skin. Underlying sigmoid diverticulosis as correlated with previous study. Vascular/Lymphatic: There are no enlarged abdominal or pelvic lymph nodes. Aortic and branch vessel atherosclerosis without evidence of aneurysm or large vessel occlusion. Reproductive: The uterus and ovaries appear unremarkable. No evidence of adnexal mass. Other: Stable small umbilical hernia containing only fat. As above, pelvic inflammatory changes surrounding the sigmoid colon. No ascites or free intraperitoneal air. Musculoskeletal: No acute or significant osseous findings. Multilevel thoracolumbar spondylosis. Previous right total hip arthroplasty. IMPRESSION: 1. Wall thickening of the sigmoid colon with surrounding inflammatory changes and probable phlegmon in the sigmoid mesocolon, consistent with acute inflammation. There is underlying sigmoid diverticulosis, and findings could be secondary to diverticulitis. Given the patient's history, active Crohn's colitis not excluded. No other signs of active bowel inflammation. No drainable fluid collection identified. 2. No evidence of fistula to the bladder, vagina or skin. 3. Prominent stool throughout the colon with mild fecalization of the distal small bowel. No evidence of bowel obstruction. 4.  Aortic Atherosclerosis (ICD10-I70.0). 5. These results will be called to the ordering clinician or representative by the Radiologist Assistant, and  communication documented in the PACS or [employer] Dashboard. Electronically Signed   By: Carey BullocksWilliam  Veazey M.D.   On: 08/16/2022 16:31     Medical Consultants:   None.   Subjective:    Whitney Higgins relates her pain is under control like to avoid hydromorphone  Objective:    Vitals:   08/17/22 2018 08/18/22 0110 08/18/22 0559 08/18/22 0633  BP: 105/60 129/66 129/62   Pulse: 85 83 78 76  Resp: 18 18 18    Temp: 98.6 F (37 C) 98.6 F (37 C) 98.4 F (36.9 C)   TempSrc:   Oral   SpO2: 91% 94% (!) 87% 100%  Weight:      Height:       SpO2: 100 %   Intake/Output Summary (Last 24 hours) at 08/18/2022 0703 Last data filed at 08/18/2022 0601 Gross per 24 hour  Intake 1585 ml  Output --  Net 1585 ml   Filed Weights   08/17/22 1744  Weight: 122.5 kg    Exam: General exam: In no acute distress. Respiratory system: Good air movement and clear to auscultation. Cardiovascular system: S1 & S2 heard, RRR. No JVD. Gastrointestinal system: Abdomen is nondistended, soft and diffuse tenderness mainly in the left lower quadrant Extremities: No pedal edema. Skin: No rashes, lesions or ulcers Psychiatry: Judgement and insight appear normal. Mood & affect appropriate.    Data Reviewed:    Labs: Basic Metabolic Panel: Recent Labs  Lab 08/15/22 1550 08/17/22 1359 08/18/22 0255  NA 133* 133* 134*  K 3.7 3.6 3.4*  CL 97 94* 97*  CO2 26 27 28   GLUCOSE 110* 107* 100*  BUN 23 22 17   CREATININE 0.99 1.19* 0.92  CALCIUM 9.9 9.3 8.9   GFR Estimated Creatinine Clearance: 75.6 mL/min (by C-G formula based on SCr of 0.92 mg/dL). Liver Function Tests: Recent Labs  Lab 08/15/22 1550 08/17/22 1359 08/18/22 0255  AST 45* 45* 32  ALT 55* 53* 44  ALKPHOS 143* 133* 102  BILITOT 0.4 0.4 0.5  PROT 8.0 7.9 6.9  ALBUMIN 3.5 3.0* 2.6*   Recent Labs  Lab 08/15/22 1550 08/17/22 1359  LIPASE 24.0 30  AMYLASE 25*  --    No results for input(s): "AMMONIA" in the last 168  hours. Coagulation profile No results for input(s): "INR", "PROTIME" in the last 168 hours. COVID-19 Labs  Recent Labs    08/15/22 1550  CRP 16.5    Lab Results  Component Value Date   SARSCOV2NAA RESULT:  NEGATIVE 04/01/2019    CBC: Recent Labs  Lab 08/15/22 1550 08/17/22 1359 08/18/22 0255  WBC 13.1* 11.3* 7.3  NEUTROABS 10.7* 9.0*  --   HGB 12.1 11.7* 10.4*  HCT 36.6 36.9 33.1*  MCV 84.1 87.4 88.3  PLT 355.0 338 280   Cardiac Enzymes: No results for input(s): "CKTOTAL", "CKMB", "CKMBINDEX", "TROPONINI" in the last 168 hours. BNP (last 3 results) No results for input(s): "PROBNP" in the last 8760 hours. CBG: No results for input(s): "GLUCAP" in the last 168 hours. D-Dimer: No results for input(s): "DDIMER" in the last 72 hours. Hgb A1c: No results for input(s): "HGBA1C" in the last 72 hours. Lipid Profile: No results for input(s): "CHOL", "HDL", "LDLCALC", "TRIG", "CHOLHDL", "LDLDIRECT" in the last 72 hours. Thyroid function studies: No results for input(s): "TSH", "T4TOTAL", "T3FREE", "THYROIDAB" in the last 72 hours.  Invalid input(s): "FREET3" Anemia work up: No results for input(s): "VITAMINB12", "FOLATE", "FERRITIN", "TIBC", "IRON", "RETICCTPCT" in the last 72 hours. Sepsis Labs: Recent Labs  Lab 08/15/22 1550 08/17/22 1359 08/18/22 0255  WBC 13.1* 11.3* 7.3   Microbiology No results found for this or any previous visit (from the past 240 hour(s)).   Medications:    amLODipine  5 mg Oral Daily   levothyroxine  75 mcg Oral QAC breakfast   montelukast  10 mg Oral QHS   pantoprazole (PROTONIX) IV  40 mg Intravenous Q24H   venlafaxine XR  150 mg Oral BID   Continuous Infusions:  ciprofloxacin 400 mg (08/18/22 1950)   metronidazole 500 mg (08/18/22 0229)      LOS: 1 day   Marinda Elk  [facility]  08/18/2022, 7:03 AM

## 2022-08-18 NOTE — TOC CM/SW Note (Signed)
  Transition of Care [facility]) Screening Note   Patient Details  Name: Whitney Higgins Date of Birth: September 06, 1955   Transition of Care [facility]) CM/SW Contact:    Amada Jupiter, LCSW Phone Number: 08/18/2022, 9:28 AM    Transition of Care Department [facility]) has reviewed patient and no TOC needs have been identified at this time. We will continue to monitor patient advancement through interdisciplinary progression rounds. If new patient transition needs arise, please place a TOC consult.

## 2022-08-19 DIAGNOSIS — R194 Change in bowel habit: Secondary | ICD-10-CM | POA: Diagnosis not present

## 2022-08-19 DIAGNOSIS — R109 Unspecified abdominal pain: Secondary | ICD-10-CM | POA: Diagnosis not present

## 2022-08-19 DIAGNOSIS — I10 Essential (primary) hypertension: Secondary | ICD-10-CM | POA: Diagnosis not present

## 2022-08-19 DIAGNOSIS — K625 Hemorrhage of anus and rectum: Secondary | ICD-10-CM | POA: Diagnosis not present

## 2022-08-19 DIAGNOSIS — R748 Abnormal levels of other serum enzymes: Secondary | ICD-10-CM | POA: Diagnosis not present

## 2022-08-19 DIAGNOSIS — K50118 Crohn's disease of large intestine with other complication: Secondary | ICD-10-CM

## 2022-08-19 DIAGNOSIS — R933 Abnormal findings on diagnostic imaging of other parts of digestive tract: Secondary | ICD-10-CM

## 2022-08-19 DIAGNOSIS — K5792 Diverticulitis of intestine, part unspecified, without perforation or abscess without bleeding: Secondary | ICD-10-CM | POA: Diagnosis not present

## 2022-08-19 LAB — CBC WITH DIFFERENTIAL/PLATELET
Abs Immature Granulocytes: 0.03 10*3/uL (ref 0.00–0.07)
Basophils Absolute: 0.1 10*3/uL (ref 0.0–0.1)
Basophils Relative: 1 %
Eosinophils Absolute: 0.2 10*3/uL (ref 0.0–0.5)
Eosinophils Relative: 2 %
HCT: 32.6 % — ABNORMAL LOW (ref 36.0–46.0)
Hemoglobin: 10.2 g/dL — ABNORMAL LOW (ref 12.0–15.0)
Immature Granulocytes: 0 %
Lymphocytes Relative: 9 %
Lymphs Abs: 0.8 10*3/uL (ref 0.7–4.0)
MCH: 27.6 pg (ref 26.0–34.0)
MCHC: 31.3 g/dL (ref 30.0–36.0)
MCV: 88.3 fL (ref 80.0–100.0)
Monocytes Absolute: 1 10*3/uL (ref 0.1–1.0)
Monocytes Relative: 12 %
Neutro Abs: 6.5 10*3/uL (ref 1.7–7.7)
Neutrophils Relative %: 76 %
Platelets: 267 10*3/uL (ref 150–400)
RBC: 3.69 MIL/uL — ABNORMAL LOW (ref 3.87–5.11)
RDW: 14 % (ref 11.5–15.5)
WBC: 8.6 10*3/uL (ref 4.0–10.5)
nRBC: 0 % (ref 0.0–0.2)

## 2022-08-19 LAB — BASIC METABOLIC PANEL
Anion gap: 6 (ref 5–15)
BUN: 9 mg/dL (ref 8–23)
CO2: 29 mmol/L (ref 22–32)
Calcium: 8.4 mg/dL — ABNORMAL LOW (ref 8.9–10.3)
Chloride: 98 mmol/L (ref 98–111)
Creatinine, Ser: 0.89 mg/dL (ref 0.44–1.00)
GFR, Estimated: >60 mL/min (ref >60)
Glucose, Bld: 128 mg/dL — ABNORMAL HIGH (ref 70–99)
Potassium: 3.7 mmol/L (ref 3.5–5.1)
Sodium: 133 mmol/L — ABNORMAL LOW (ref 135–145)

## 2022-08-19 MED ORDER — POTASSIUM CHLORIDE 20 MEQ PO PACK
40.0000 meq | PACK | Freq: Once | ORAL | Status: AC
  Administered 2022-08-19: 40 meq via ORAL
  Filled 2022-08-19: qty 2

## 2022-08-19 MED ORDER — SODIUM CHLORIDE 0.9 % IV SOLN
INTRAVENOUS | Status: DC | PRN
  Administered 2022-09-01: 10 mL via INTRAVENOUS

## 2022-08-19 NOTE — Progress Notes (Signed)
    Progress Note   Assessment    Crohn's colitis involving the left colon with suspected phlegmon in the sigmoid mesocolon and prominent stool, less likely diverticulitis.  Constipation    Recommendations   Continue IV ciprofloxacin, IV metronidazole and Lialda, advance diet as tolerated No corticosteroids for now with concern for active infection Repeat CT AP on Monday  Miralax qd to bid for constipation    Chief Complaint   Left sided abdominal pain, constipation -  both unchanged  Vital signs in last 24 hours: Temp:  [97.7 F (36.5 C)-99 F (37.2 C)] 98.7 F (37.1 C) (04/06 0555) Pulse Rate:  [75-97] 97 (04/06 0555) Resp:  [18] 18 (04/06 0555) BP: (111-152)/(49-72) 152/72 (04/06 0555) SpO2:  [92 %-98 %] 97 % (04/06 0555) Last BM Date : 08/17/22 (per patient- had a small BM day of admission)  General: Alert, well-developed, in NAD Heart:  Regular rate and rhythm; no murmurs Chest: Clear to ascultation bilaterally Abdomen:  Soft, mild LLQ tenderness and nondistended. Normal bowel sounds, without guarding, and without rebound.   Extremities:  Without edema. Neurologic:  Alert and  oriented x4; grossly normal neurologically. Psych:  Alert and cooperative. Normal mood and affect.  Intake/Output from previous day: 04/05 0701 - 04/06 0700 In: 3193.5 [P.O.:1100; I.V.:1377.1; IV Piggyback:716.4] Out: -  Intake/Output this shift: No intake/output data recorded.  Lab Results: Recent Labs    08/17/22 1359 08/18/22 0255 08/19/22 0336  WBC 11.3* 7.3 8.6  HGB 11.7* 10.4* 10.2*  HCT 36.9 33.1* 32.6*  PLT 338 280 267   BMET Recent Labs    08/17/22 1359 08/18/22 0255  NA 133* 134*  K 3.6 3.4*  CL 94* 97*  CO2 27 28  GLUCOSE 107* 100*  BUN 22 17  CREATININE 1.19* 0.92  CALCIUM 9.3 8.9   LFT Recent Labs    08/18/22 0255  PROT 6.9  ALBUMIN 2.6*  AST 32  ALT 44  ALKPHOS 102  BILITOT 0.5     LOS: 2 days   Javonne Dorko T. Russella Dar, MD 08/19/2022, 9:20 AM See  [facility], to contact our on call provider

## 2022-08-19 NOTE — Progress Notes (Signed)
[facility] PROGRESS NOTE    Progress Note  Whitney Higgins  VZD:[id number] DOB: 01/18/1956 DOA: 08/17/2022 PCP: Montez Hageman, DO     Brief Narrative:   Whitney Higgins is an 67 y.o. female past medical history significant for seasonal allergies anxiety asthma chronic back pain inflammatory bowel disease sent by the GI doctor to the emergency room for abdominal pain CT scan of the abdomen and pelvis showed inflammatory changes and probably phlegmon in the sigmoid mesocolon consistent with acute diverticulitis   Assessment/Plan:   Acute diverticulitis superimposed on Crohn's colitis: Continue Cipro Flagyl. She has defervesced leukocytosis improved. Continue current diet Zofran and narcotics for analgesics. Anticipate repeat a CT scan in 2 or 3 days to assess for abscess. Restart the although avoid NSAIDs. Potassium was borderline low yesterday recheck tomorrow after oral repletion today.  Obesity, Class III, BMI 40-49.9 (morbid obesity) Noted, has been counseled.  Asthma: Currently asymptomatic continue current medications.  Chronic anxiety/major depression: Continue current meds.  Essential hypertension Continue amlodipine. Her blood pressure slightly elevated due to pain.  hypercholesterolemia Continue statins.  Abnormal alkaline phosphatase test/  Transaminitis Resolved likely due to acute infectious etiology.    DVT prophylaxis: lovenox Family Communication:none Status is: Inpatient Remains inpatient appropriate because: Acute diverticulitis    Code Status:     Code Status Orders  (From admission, onward)           Start     Ordered   08/17/22 1454  Full code  Continuous       Question:  By:  Answer:  Consent: discussion documented in EHR   08/17/22 1454           Code Status History     Date Active Date Inactive Code Status Order ID Comments User Context   03/18/2013 1447 03/19/2013 1305 Full Code [id number]  Kritzer, Rolanda Lundborg, MD  Inpatient      Advance Directive Documentation    Flowsheet Row Most Recent Value  Type of Advance Directive Healthcare Power of Attorney, Living will  Pre-existing out of facility DNR order (yellow form or pink MOST form) --  "MOST" Form in Place? --         IV Access:   Peripheral IV   Procedures and diagnostic studies:   No results found.   Medical Consultants:   None.   Subjective:    Whitney Higgins feels much better wants to have a bowel movement.  Objective:    Vitals:   08/18/22 1118 08/18/22 1342 08/18/22 2159 08/19/22 0555  BP: (!) 111/49 (!) 125/49 (!) 147/68 (!) 152/72  Pulse: 75 80 86 97  Resp: 18 18 18 18   Temp: 98.3 F (36.8 C) 97.7 F (36.5 C) 99 F (37.2 C) 98.7 F (37.1 C)  TempSrc: Oral Oral Oral Oral  SpO2: 98% 95% 92% 97%  Weight:      Height:       SpO2: 97 %   Intake/Output Summary (Last 24 hours) at 08/19/2022 0808 Last data filed at 08/19/2022 0600 Gross per 24 hour  Intake 3175.54 ml  Output --  Net 3175.54 ml    Filed Weights   08/17/22 1744  Weight: 122.5 kg    Exam: General exam: In no acute distress. Respiratory system: Good air movement and clear to auscultation. Cardiovascular system: S1 & S2 heard, RRR. No JVD. Gastrointestinal system: Abdomen is nondistended, soft and nontender.  Extremities: No pedal edema. Skin: No rashes, lesions or ulcers Psychiatry: Judgement and insight appear  normal. Mood & affect appropriate.  Data Reviewed:    Labs: Basic Metabolic Panel: Recent Labs  Lab 08/15/22 1550 08/17/22 1359 08/18/22 0255  NA 133* 133* 134*  K 3.7 3.6 3.4*  CL 97 94* 97*  CO2 26 27 28   GLUCOSE 110* 107* 100*  BUN 23 22 17   CREATININE 0.99 1.19* 0.92  CALCIUM 9.9 9.3 8.9    GFR Estimated Creatinine Clearance: 75.6 mL/min (by C-G formula based on SCr of 0.92 mg/dL). Liver Function Tests: Recent Labs  Lab 08/15/22 1550 08/17/22 1359 08/18/22 0255  AST 45* 45* 32  ALT 55* 53* 44   ALKPHOS 143* 133* 102  BILITOT 0.4 0.4 0.5  PROT 8.0 7.9 6.9  ALBUMIN 3.5 3.0* 2.6*    Recent Labs  Lab 08/15/22 1550 08/17/22 1359  LIPASE 24.0 30  AMYLASE 25*  --     No results for input(s): "AMMONIA" in the last 168 hours. Coagulation profile No results for input(s): "INR", "PROTIME" in the last 168 hours. COVID-19 Labs  No results for input(s): "DDIMER", "FERRITIN", "LDH", "CRP" in the last 72 hours.   Lab Results  Component Value Date   SARSCOV2NAA RESULT:  NEGATIVE 04/01/2019    CBC: Recent Labs  Lab 08/15/22 1550 08/17/22 1359 08/18/22 0255 08/19/22 0336  WBC 13.1* 11.3* 7.3 8.6  NEUTROABS 10.7* 9.0*  --  6.5  HGB 12.1 11.7* 10.4* 10.2*  HCT 36.6 36.9 33.1* 32.6*  MCV 84.1 87.4 88.3 88.3  PLT 355.0 338 280 267    Cardiac Enzymes: No results for input(s): "CKTOTAL", "CKMB", "CKMBINDEX", "TROPONINI" in the last 168 hours. BNP (last 3 results) No results for input(s): "PROBNP" in the last 8760 hours. CBG: No results for input(s): "GLUCAP" in the last 168 hours. D-Dimer: No results for input(s): "DDIMER" in the last 72 hours. Hgb A1c: No results for input(s): "HGBA1C" in the last 72 hours. Lipid Profile: No results for input(s): "CHOL", "HDL", "LDLCALC", "TRIG", "CHOLHDL", "LDLDIRECT" in the last 72 hours. Thyroid function studies: No results for input(s): "TSH", "T4TOTAL", "T3FREE", "THYROIDAB" in the last 72 hours.  Invalid input(s): "FREET3" Anemia work up: No results for input(s): "VITAMINB12", "FOLATE", "FERRITIN", "TIBC", "IRON", "RETICCTPCT" in the last 72 hours. Sepsis Labs: Recent Labs  Lab 08/15/22 1550 08/17/22 1359 08/18/22 0255 08/19/22 0336  WBC 13.1* 11.3* 7.3 8.6    Microbiology No results found for this or any previous visit (from the past 240 hour(s)).   Medications:    amLODipine  5 mg Oral Daily   levothyroxine  75 mcg Oral QAC breakfast   mesalamine  2.4 g Oral BID   montelukast  10 mg Oral QHS   pantoprazole  (PROTONIX) IV  40 mg Intravenous Q24H   polyethylene glycol  17 g Oral Daily   venlafaxine XR  150 mg Oral BID   Continuous Infusions:  ciprofloxacin 400 mg (08/19/22 0554)   metronidazole 500 mg (08/19/22 0445)      LOS: 2 days   Marinda Elk  [facility]  08/19/2022, 8:08 AM

## 2022-08-20 ENCOUNTER — Inpatient Hospital Stay (HOSPITAL_COMMUNITY): Payer: Medicare Other

## 2022-08-20 DIAGNOSIS — R194 Change in bowel habit: Secondary | ICD-10-CM | POA: Diagnosis not present

## 2022-08-20 DIAGNOSIS — K625 Hemorrhage of anus and rectum: Secondary | ICD-10-CM | POA: Diagnosis not present

## 2022-08-20 DIAGNOSIS — I10 Essential (primary) hypertension: Secondary | ICD-10-CM | POA: Diagnosis not present

## 2022-08-20 DIAGNOSIS — R1031 Right lower quadrant pain: Secondary | ICD-10-CM

## 2022-08-20 DIAGNOSIS — K5792 Diverticulitis of intestine, part unspecified, without perforation or abscess without bleeding: Secondary | ICD-10-CM | POA: Diagnosis not present

## 2022-08-20 DIAGNOSIS — R1032 Left lower quadrant pain: Secondary | ICD-10-CM

## 2022-08-20 DIAGNOSIS — R748 Abnormal levels of other serum enzymes: Secondary | ICD-10-CM | POA: Diagnosis not present

## 2022-08-20 DIAGNOSIS — R109 Unspecified abdominal pain: Secondary | ICD-10-CM | POA: Diagnosis not present

## 2022-08-20 LAB — CBC WITH DIFFERENTIAL/PLATELET
Abs Immature Granulocytes: 0.03 10*3/uL (ref 0.00–0.07)
Basophils Absolute: 0.1 10*3/uL (ref 0.0–0.1)
Basophils Relative: 1 %
Eosinophils Absolute: 0.1 10*3/uL (ref 0.0–0.5)
Eosinophils Relative: 2 %
HCT: 32.7 % — ABNORMAL LOW (ref 36.0–46.0)
Hemoglobin: 10.2 g/dL — ABNORMAL LOW (ref 12.0–15.0)
Immature Granulocytes: 0 %
Lymphocytes Relative: 11 %
Lymphs Abs: 0.9 10*3/uL (ref 0.7–4.0)
MCH: 27.8 pg (ref 26.0–34.0)
MCHC: 31.2 g/dL (ref 30.0–36.0)
MCV: 89.1 fL (ref 80.0–100.0)
Monocytes Absolute: 1 10*3/uL (ref 0.1–1.0)
Monocytes Relative: 11 %
Neutro Abs: 6.4 10*3/uL (ref 1.7–7.7)
Neutrophils Relative %: 75 %
Platelets: 255 10*3/uL (ref 150–400)
RBC: 3.67 MIL/uL — ABNORMAL LOW (ref 3.87–5.11)
RDW: 14.2 % (ref 11.5–15.5)
WBC: 8.5 10*3/uL (ref 4.0–10.5)
nRBC: 0 % (ref 0.0–0.2)

## 2022-08-20 MED ORDER — IOHEXOL 300 MG/ML  SOLN
100.0000 mL | Freq: Once | INTRAMUSCULAR | Status: AC | PRN
  Administered 2022-08-20: 100 mL via INTRAVENOUS

## 2022-08-20 MED ORDER — SODIUM CHLORIDE 0.9 % IV SOLN: INTRAVENOUS | Status: AC

## 2022-08-20 MED ORDER — PIPERACILLIN-TAZOBACTAM 3.375 G IVPB 30 MIN: 3.3750 g | Freq: Four times a day (QID) | INTRAVENOUS | Status: DC

## 2022-08-20 MED ORDER — PANTOPRAZOLE SODIUM 40 MG PO TBEC
40.0000 mg | DELAYED_RELEASE_TABLET | Freq: Every day | ORAL | Status: DC
  Administered 2022-08-20 – 2022-08-29 (×10): 40 mg via ORAL
  Filled 2022-08-20 (×10): qty 1

## 2022-08-20 MED ORDER — PIPERACILLIN-TAZOBACTAM 3.375 G IVPB
3.3750 g | Freq: Three times a day (TID) | INTRAVENOUS | Status: DC
  Administered 2022-08-20 – 2022-09-02 (×39): 3.375 g via INTRAVENOUS
  Filled 2022-08-20 (×39): qty 50

## 2022-08-20 MED ORDER — SODIUM CHLORIDE 0.9 % IV SOLN: INTRAVENOUS | Status: DC

## 2022-08-20 NOTE — Progress Notes (Signed)
[facility] PROGRESS NOTE    Progress Note  Whitney Higgins  NHA:[id number] DOB: 1955/09/08 DOA: 08/17/2022 PCP: Montez Hageman, DO     Brief Narrative:   Whitney Higgins is an 67 y.o. female past medical history significant for seasonal allergies anxiety asthma chronic back pain inflammatory bowel disease sent by the GI doctor to the emergency room for abdominal pain CT scan of the abdomen and pelvis showed inflammatory changes and probably phlegmon in the sigmoid mesocolon consistent with acute diverticulitis   Assessment/Plan:   Acute diverticulitis superimposed on Crohn's colitis: Continue IV Cipro and Flagyl has defervesced leukocytosis improved. She is tolerating her diet. Her abdominal pain is worse today, now she has right lower quadrant pain she has a history of an appendectomy.  She is significantly tender on physical exam more than yesterday on the right and left lower quadrants Will get a CT scan with contrast today. Discontinue Cipro and Flagyl started on IV Zosyn.  Obesity, Class III, BMI 40-49.9 (morbid obesity) Noted, has been counseled.  Asthma: Currently asymptomatic continue current medications.  Chronic anxiety/major depression: Continue current meds.  Essential hypertension Continue amlodipine. Her blood pressure slightly elevated due to pain.  hypercholesterolemia Continue statins.  Abnormal alkaline phosphatase test/  Transaminitis Resolved likely due to acute infectious etiology.    DVT prophylaxis: lovenox Family Communication:none Status is: Inpatient Remains inpatient appropriate because: Acute diverticulitis    Code Status:     Code Status Orders  (From admission, onward)           Start     Ordered   08/17/22 1454  Full code  Continuous       Question:  By:  Answer:  Consent: discussion documented in EHR   08/17/22 1454           Code Status History     Date Active Date Inactive Code Status Order ID Comments User  Context   03/18/2013 1447 03/19/2013 1305 Full Code [id number]  Kritzer, Rolanda Lundborg, MD Inpatient      Advance Directive Documentation    Flowsheet Row Most Recent Value  Type of Advance Directive Healthcare Power of Attorney, Living will  Pre-existing out of facility DNR order (yellow form or pink MOST form) --  "MOST" Form in Place? --         IV Access:   Peripheral IV   Procedures and diagnostic studies:   No results found.   Medical Consultants:   None.   Subjective:    Whitney Higgins does not feel better today nauseated with abdominal pain  Objective:    Vitals:   08/19/22 0555 08/19/22 1422 08/19/22 2233 08/20/22 0510  BP: (!) 152/72 138/71 (!) 130/56 (!) 143/73  Pulse: 97 82 84 80  Resp: 18 18 19 18   Temp: 98.7 F (37.1 C) 98.4 F (36.9 C) 97.9 F (36.6 C) 97.9 F (36.6 C)  TempSrc: Oral  Oral Oral  SpO2: 97% 93% 93% 96%  Weight:      Height:       SpO2: 96 %   Intake/Output Summary (Last 24 hours) at 08/20/2022 0750 Last data filed at 08/20/2022 0600 Gross per 24 hour  Intake 979.33 ml  Output --  Net 979.33 ml    Filed Weights   08/17/22 1744  Weight: 122.5 kg    Exam: General exam: In no acute distress. Respiratory system: Good air movement and clear to auscultation. Cardiovascular system: S1 & S2 heard, RRR. No JVD. Gastrointestinal system: Positive  sound soft but with left lower quadrant tenderness and significant right lower quadrant tenderness which is worse than yesterday. Extremities: No pedal edema. Skin: No rashes, lesions or ulcers Psychiatry: Judgement and insight appear normal. Mood & affect appropriate.  Data Reviewed:    Labs: Basic Metabolic Panel: Recent Labs  Lab 08/15/22 1550 08/17/22 1359 08/18/22 0255 08/19/22 1006  NA 133* 133* 134* 133*  K 3.7 3.6 3.4* 3.7  CL 97 94* 97* 98  CO2 26 27 28 29   GLUCOSE 110* 107* 100* 128*  BUN 23 22 17 9   CREATININE 0.99 1.19* 0.92 0.89  CALCIUM 9.9 9.3 8.9 8.4*     GFR Estimated Creatinine Clearance: 78.1 mL/min (by C-G formula based on SCr of 0.89 mg/dL). Liver Function Tests: Recent Labs  Lab 08/15/22 1550 08/17/22 1359 08/18/22 0255  AST 45* 45* 32  ALT 55* 53* 44  ALKPHOS 143* 133* 102  BILITOT 0.4 0.4 0.5  PROT 8.0 7.9 6.9  ALBUMIN 3.5 3.0* 2.6*    Recent Labs  Lab 08/15/22 1550 08/17/22 1359  LIPASE 24.0 30  AMYLASE 25*  --     No results for input(s): "AMMONIA" in the last 168 hours. Coagulation profile No results for input(s): "INR", "PROTIME" in the last 168 hours. COVID-19 Labs  No results for input(s): "DDIMER", "FERRITIN", "LDH", "CRP" in the last 72 hours.   Lab Results  Component Value Date   SARSCOV2NAA RESULT:  NEGATIVE 04/01/2019    CBC: Recent Labs  Lab 08/15/22 1550 08/17/22 1359 08/18/22 0255 08/19/22 0336 08/20/22 0322  WBC 13.1* 11.3* 7.3 8.6 8.5  NEUTROABS 10.7* 9.0*  --  6.5 6.4  HGB 12.1 11.7* 10.4* 10.2* 10.2*  HCT 36.6 36.9 33.1* 32.6* 32.7*  MCV 84.1 87.4 88.3 88.3 89.1  PLT 355.0 338 280 267 255    Cardiac Enzymes: No results for input(s): "CKTOTAL", "CKMB", "CKMBINDEX", "TROPONINI" in the last 168 hours. BNP (last 3 results) No results for input(s): "PROBNP" in the last 8760 hours. CBG: No results for input(s): "GLUCAP" in the last 168 hours. D-Dimer: No results for input(s): "DDIMER" in the last 72 hours. Hgb A1c: No results for input(s): "HGBA1C" in the last 72 hours. Lipid Profile: No results for input(s): "CHOL", "HDL", "LDLCALC", "TRIG", "CHOLHDL", "LDLDIRECT" in the last 72 hours. Thyroid function studies: No results for input(s): "TSH", "T4TOTAL", "T3FREE", "THYROIDAB" in the last 72 hours.  Invalid input(s): "FREET3" Anemia work up: No results for input(s): "VITAMINB12", "FOLATE", "FERRITIN", "TIBC", "IRON", "RETICCTPCT" in the last 72 hours. Sepsis Labs: Recent Labs  Lab 08/17/22 1359 08/18/22 0255 08/19/22 0336 08/20/22 0322  WBC 11.3* 7.3 8.6 8.5     Microbiology No results found for this or any previous visit (from the past 240 hour(s)).   Medications:    amLODipine  5 mg Oral Daily   levothyroxine  75 mcg Oral QAC breakfast   mesalamine  2.4 g Oral BID   montelukast  10 mg Oral QHS   pantoprazole (PROTONIX) IV  40 mg Intravenous Q24H   polyethylene glycol  17 g Oral Daily   venlafaxine XR  150 mg Oral BID   Continuous Infusions:  sodium chloride 10 mL/hr at 08/19/22 2101   ciprofloxacin 400 mg (08/20/22 0507)   metronidazole 500 mg (08/20/22 0208)      LOS: 3 days   Marinda Elk  [facility]  08/20/2022, 7:50 AM

## 2022-08-20 NOTE — Progress Notes (Signed)
    Progress Note   Assessment    Whitney Higgins, Whitney likely diverticulitis. R/O developing abscess Constipation    Recommendations   Changed to Zosyn IV by hospitalist, Continue Lialda Repeat CT AP Whitney  No corticosteroids for now with concern for active infection, await repeat CT result Miralax qd to bid for constipation   Chief Complaint   Persistent lower abdominal Higgins, Whitney R >L. Small Higgins with mucous.  Vital signs in last 24 hours: Temp:  [97.9 F (36.6 C)-98.4 F (36.9 C)] 97.9 F (36.6 C) (04/07 0510) Pulse Rate:  [80-84] 80 (04/07 0510) Resp:  [18-19] 18 (04/07 0510) BP: (130-143)/(56-73) 143/73 (04/07 0510) SpO2:  [93 %-96 %] 96 % (04/07 0510) Last BM Date : 08/17/22  General: Alert, well-developed, uncomfortable Heart:  Regular rate and rhythm; no murmurs Chest: Clear to ascultation bilaterally Abdomen:  Soft, lower abdominal tenderness: R > L and nondistended. Normal bowel sounds, without guarding, and without rebound.   Extremities:  Without edema. Neurologic:  Alert and  oriented x4; grossly normal neurologically. Psych:  Alert and cooperative. Normal mood and affect.  Intake/Output from previous day: 04/06 0701 - 04/07 0700 In: 979.3 [P.O.:300; I.V.:71.1; IV Piggyback:608.3] Out: -  Intake/Output this shift: No intake/output data recorded.  Lab Results: Recent Labs    08/18/22 0255 08/19/22 0336 08/20/22 0322  WBC 7.3 8.6 8.5  HGB 10.4* 10.2* 10.2*  HCT 33.1* 32.6* 32.7*  PLT 280 267 255   BMET Recent Labs    08/17/22 1359 08/18/22 0255 08/19/22 1006  NA 133* 134* 133*  K 3.6 3.4* 3.7  CL 94* 97* 98  CO2 27 28 29   GLUCOSE 107* 100* 128*  BUN 22 17 9   CREATININE 1.19* 0.92 0.89  CALCIUM 9.3 8.9 8.4*   LFT Recent Labs    08/18/22 0255  PROT 6.9  ALBUMIN 2.6*  AST 32  ALT 44  ALKPHOS 102  BILITOT 0.5     LOS: 3 days    Anitria Andon T. Russella Dar, MD 08/20/2022, 9:58 AM See Loretha Higgins, Whitney Higgins, to contact our on call provider

## 2022-08-21 DIAGNOSIS — J45909 Unspecified asthma, uncomplicated: Secondary | ICD-10-CM

## 2022-08-21 DIAGNOSIS — K5792 Diverticulitis of intestine, part unspecified, without perforation or abscess without bleeding: Secondary | ICD-10-CM | POA: Diagnosis not present

## 2022-08-21 DIAGNOSIS — F32A Depression, unspecified: Secondary | ICD-10-CM

## 2022-08-21 DIAGNOSIS — K5090 Crohn's disease, unspecified, without complications: Secondary | ICD-10-CM

## 2022-08-21 DIAGNOSIS — E7800 Pure hypercholesterolemia, unspecified: Secondary | ICD-10-CM

## 2022-08-21 DIAGNOSIS — R194 Change in bowel habit: Secondary | ICD-10-CM | POA: Diagnosis not present

## 2022-08-21 DIAGNOSIS — E876 Hypokalemia: Secondary | ICD-10-CM

## 2022-08-21 DIAGNOSIS — R748 Abnormal levels of other serum enzymes: Secondary | ICD-10-CM | POA: Diagnosis not present

## 2022-08-21 DIAGNOSIS — K625 Hemorrhage of anus and rectum: Secondary | ICD-10-CM | POA: Diagnosis not present

## 2022-08-21 DIAGNOSIS — F419 Anxiety disorder, unspecified: Secondary | ICD-10-CM

## 2022-08-21 DIAGNOSIS — K529 Noninfective gastroenteritis and colitis, unspecified: Secondary | ICD-10-CM

## 2022-08-21 DIAGNOSIS — I10 Essential (primary) hypertension: Secondary | ICD-10-CM | POA: Diagnosis not present

## 2022-08-21 DIAGNOSIS — R7401 Elevation of levels of liver transaminase levels: Secondary | ICD-10-CM

## 2022-08-21 LAB — CBC WITH DIFFERENTIAL/PLATELET
Abs Immature Granulocytes: 0.06 10*3/uL (ref 0.00–0.07)
Basophils Absolute: 0.1 10*3/uL (ref 0.0–0.1)
Basophils Relative: 1 %
Eosinophils Absolute: 0.2 10*3/uL (ref 0.0–0.5)
Eosinophils Relative: 3 %
HCT: 32.4 % — ABNORMAL LOW (ref 36.0–46.0)
Hemoglobin: 10.1 g/dL — ABNORMAL LOW (ref 12.0–15.0)
Immature Granulocytes: 1 %
Lymphocytes Relative: 9 %
Lymphs Abs: 0.8 10*3/uL (ref 0.7–4.0)
MCH: 27.8 pg (ref 26.0–34.0)
MCHC: 31.2 g/dL (ref 30.0–36.0)
MCV: 89.3 fL (ref 80.0–100.0)
Monocytes Absolute: 1 10*3/uL (ref 0.1–1.0)
Monocytes Relative: 11 %
Neutro Abs: 6.4 10*3/uL (ref 1.7–7.7)
Neutrophils Relative %: 75 %
Platelets: 243 10*3/uL (ref 150–400)
RBC: 3.63 MIL/uL — ABNORMAL LOW (ref 3.87–5.11)
RDW: 14.3 % (ref 11.5–15.5)
WBC: 8.5 10*3/uL (ref 4.0–10.5)
nRBC: 0 % (ref 0.0–0.2)

## 2022-08-21 LAB — HEPATITIS PANEL, ACUTE
HCV Ab: NONREACTIVE
Hep A IgM: NONREACTIVE
Hep B C IgM: NONREACTIVE
Hepatitis B Surface Ag: NONREACTIVE

## 2022-08-21 MED ORDER — METHYLPREDNISOLONE SODIUM SUCC 40 MG IJ SOLR
40.0000 mg | Freq: Every day | INTRAMUSCULAR | Status: DC
  Administered 2022-08-21 – 2022-08-28 (×8): 40 mg via INTRAVENOUS
  Filled 2022-08-21 (×8): qty 1

## 2022-08-21 NOTE — Care Management Important Message (Signed)
Important Message  Patient Details IM Letter given. Name: Whitney Higgins MRN: [id number] Date of Birth: 1956/01/25   Medicare Important Message Given:  Yes     Caren Macadam 08/21/2022, 1:59 PM

## 2022-08-21 NOTE — Progress Notes (Signed)
Mobility Specialist - Progress Note   08/21/22 1403  Mobility  Activity Ambulated with assistance in hallway;Ambulated with assistance to bathroom  Level of Assistance Standby assist, set-up cues, supervision of patient - no hands on  Assistive Device Cane  Distance Ambulated (ft) 120 ft  Activity Response Tolerated well  Mobility Referral Yes  $Mobility charge 1 Mobility   Pt received in bathroom and agreeable to mobility. No complaints during session. Pt to bed after session with all needs met.    [facility]

## 2022-08-21 NOTE — Progress Notes (Addendum)
Patient ID: Percell BostonWendy Kleckner, female   DOB: 08/14/1955, 67 y.o.   MRN: [id number]    Progress Note   Subjective   Day # 4  CC; history of Crohn's colitis-abnormal CT   Now on IV Zosyn Lialda 4.8 g daily  CT 4/7- with moderate wall thickening and pericolonic soft tissue stranding again seen involving the mid sigmoid colon, a loop of distal ileum also seen at this site in the central pelvis which shows wall thickening and adjacent soft tissue stranding, probable fistula a and spiculated mixed attenuation density seen in the adjacent central pelvis measuring 5 x 2.9 cm suspicious for phlegmon or early abscess-no significant change from prior CT-differential includes perforated diverticulitis versus diverticular abscess and secondary involvement of small bowel or penetrating Crohn's disease with abscess and secondary involvement of sigmoid colon no free air  Labs today-WBC 8.5/hemoglobin 10/hematocrit 32.4 Fecal calprotectin pending  Patient says she feels about the same little bit better than when she came in but overall no significant improvement.  Continues to have abdominal pain exacerbated postprandially.  She is starting to finally have some bowel movements with twice daily MiraLAX.    Objective   Vital signs in last 24 hours: Temp:  [98.8 F (37.1 C)-99.5 F (37.5 C)] 98.8 F (37.1 C) (04/08 0547) Pulse Rate:  [80-93] 80 (04/08 0547) Resp:  [16-18] 16 (04/08 0547) BP: (120-145)/(55-78) 120/55 (04/08 0547) SpO2:  [90 %-93 %] 90 % (04/08 0547) Last BM Date : 08/20/22 General: Older white female in NAD Heart:  Regular rate and rhythm; no murmurs Lungs: Respirations even and unlabored, lungs CTA bilaterally Abdomen:  Soft, obese, she is tender across the lower abdomen and in the mid lower abdomen no rebound, normal bowel sounds. Extremities:  Without edema. Neurologic:  Alert and oriented,  grossly normal neurologically. Psych:  Cooperative. Normal mood and affect.  Intake/Output  from previous day: 04/07 0701 - 04/08 0700 In: 1327.5 [P.O.:840; I.V.:312; IV Piggyback:175.5] Out: -  Intake/Output this shift: No intake/output data recorded.  Lab Results: Recent Labs    08/19/22 0336 08/20/22 0322 08/21/22 0325  WBC 8.6 8.5 8.5  HGB 10.2* 10.2* 10.1*  HCT 32.6* 32.7* 32.4*  PLT 267 255 243   BMET Recent Labs    08/19/22 1006  NA 133*  K 3.7  CL 98  CO2 29  GLUCOSE 128*  BUN 9  CREATININE 0.89  CALCIUM 8.4*   LFT No results for input(s): "PROT", "ALBUMIN", "AST", "ALT", "ALKPHOS", "BILITOT", "BILIDIR", "IBILI" in the last 72 hours. PT/INR No results for input(s): "LABPROT", "INR" in the last 72 hours.  Studies/Results: CT ABDOMEN PELVIS W CONTRAST  Result Date: 08/20/2022 CLINICAL DATA:  Right lower quadrant pain. Crohn disease. Diverticulitis. EXAM: CT ABDOMEN AND PELVIS WITH CONTRAST TECHNIQUE: Multidetector CT imaging of the abdomen and pelvis was performed using the standard protocol following bolus administration of intravenous contrast. RADIATION DOSE REDUCTION: This exam was performed according to the departmental dose-optimization program which includes automated exposure control, adjustment of the mA and/or kV according to patient size and/or use of iterative reconstruction technique. CONTRAST:  100mL OMNIPAQUE IOHEXOL 300 MG/ML  SOLN COMPARISON:  08/16/2022 FINDINGS: Lower Chest: No acute findings. Hepatobiliary:  No hepatic masses identified. Pancreas:  No mass or inflammatory changes. Spleen: Within normal limits in size and appearance. Adrenals/Urinary Tract: No suspicious masses identified. No evidence of ureteral calculi or hydronephrosis. Stomach/Bowel: Moderate wall thickening and pericolonic soft tissue stranding is again seen involving the mid sigmoid colon. A loop  of distal ileum is also seen at this site in the central pelvis which shows wall thickening and adjacent soft tissue stranding. Probable fistulae and spiculated mixed attenuation  density are seen in the adjacent central pelvis which measures 5.0 x 2.9 cm, suspicious for phlegmon or early abscess. These findings show no significant change. Differential diagnosis includes perforated diverticulitis with diverticular abscess and secondary involvement of small bowel, or penetrating Crohn disease with abscess and secondary involvement of sigmoid colon. No evidence of free intraperitoneal air. Vascular/Lymphatic: No pathologically enlarged lymph nodes. No acute vascular findings. Reproductive: Prior hysterectomy noted. Adnexal regions are unremarkable in appearance. Other:  None. Musculoskeletal: No suspicious bone lesions identified. Right hip prosthesis again noted. IMPRESSION: No significant change in central pelvic inflammatory process involving the sigmoid colon and distal ileum, with adjacent phlegmon or early abscess. Differential diagnosis remains perforated sigmoid diverticulitis with diverticular abscess, and penetrating Crohn disease with abscess and secondary involvement of sigmoid colon. Electronically Signed   By: Danae Orleans M.D.   On: 08/20/2022 16:31       Assessment / Plan:    #17 67 year old white female with history of Crohn's colitis/left colon admitted with subacute symptoms present over the past couple of months with change in bowel habits, loose stools alternating with constipation and intermittent rectal bleeding and persistent lower abdominal discomfort and cramping.  Initial CT imaging as an outpatient showed a severe inflammatory process of the sigmoid and descending colon with surrounding inflammatory changes and probable phlegmon in the sigmoid mesocolon measuring 4.3 x 3.5 cm and also prominent stool in the colon It was suspect that this was related to Crohn's exacerbation rather than diverticulitis but unable to differentiate by imaging.  She has been on IV antibiotics initially Cipro and Flagyl and now Zosyn without any real improvement in symptoms over  the past 4 days  Repeat CT yesterday as above or concerning for Crohn's there is persistent moderate wall thickening and soft tissue stranding involving the mid sigmoid colon, there is also a loop of distal ileum at this site which shows wall thickening and adjacent soft tissue stranding, questionable fistula and a spiculated mixed attenuation density in the central pelvis measuring 5 x 2.9 cm suspicious for Phlegmon or early abscess, differential includes perforated diverticulitis with diverticular abscess and secondary involvement of small bowel versus penetrating Crohn's disease with abscess and secondary involvement of sigmoid colon.  Plan; tolerating regular diet Continue IV Zosyn I think she will benefit from IV steroids Solu-Medrol 40 mg daily, as a trial as we are not making any progress with IV antibiotics alone. The fluid collection does not appear mature enough for IR drainage-expect she will need repeat imaging in 3 to 4 days to reassess this area and see if IR drainage will be needed and to assess for response to steroids.     Principal Problem:   Acute diverticulitis Active Problems:   Crohn's colitis (HCC) - left-sided   Obesity, Class III, BMI 40-49.9 (morbid obesity)   Acquired hypothyroidism   Asthma   Chronic anxiety   Essential hypertension   Hypercholesterolemia   Major depressive disorder   Abnormal alkaline phosphatase test   Transaminitis   Abnormal CT scan, colon   Abdominal pain, left lower quadrant   RLQ abdominal pain     LOS: 4 days   Amy Esterwood PA-C 08/21/2022, 9:05 AM    Attending physician's note    I have taken history, reviewed the chart and examined the patient. I performed  a substantive portion of this encounter, including complete performance of at least one of the key components, in conjunction with the APP. I agree with the Advanced Practitioner's note, impression and recommendations.   Received signout from Dr. Russella Dar CT from yesterday  reviewed -No significant change in central pelvic inflammatory process  -Likely penetrating Crohn's disease with phlegmon.  No drainable abscess. -D/d perforated sigmoid diverticulitis with diverticular abscess  Plan; Continue IV Zosyn Add IV Solu-Medrol Rpt CT depending upon clinical response-likely in 3-4 days.  If needed, IR/surgical consultation thereafter Check TB Gold, hep panel, TPMT in anticipation of outpt biologics +/- imuran Needs DVT prophylaxis Ambulate   Edman Circle, MD [facility] [phone]

## 2022-08-21 NOTE — Plan of Care (Signed)
°  Problem: Clinical Measurements: °Goal: Will remain free from infection °Outcome: Progressing °  °Problem: Activity: °Goal: Risk for activity intolerance will decrease °Outcome: Progressing °  °Problem: Nutrition: °Goal: Adequate nutrition will be maintained °Outcome: Progressing °  °

## 2022-08-21 NOTE — Progress Notes (Signed)
[facility] PROGRESS NOTE    Progress Note  Whitney Higgins  WUJ:[id number] DOB: 02/08/1956 DOA: 08/17/2022 PCP: Whitney Higgins     Brief Narrative:   Whitney Higgins is an 67 y.o. female past medical history significant for seasonal allergies anxiety asthma chronic back pain inflammatory bowel disease sent by the GI doctor to the emergency room for abdominal pain CT scan of the abdomen and pelvis showed inflammatory changes and probably phlegmon in the sigmoid mesocolon consistent with acute diverticulitis   Assessment/Plan:   Acute diverticulitis superimposed on Crohn's colitis: Continue IV Zosyn.  She has remained afebrile. She is tolerating her diet. CT of the abdomen and pelvis showed no significant changes in pelvic inflammatory process of the sigmoid colon and distal ileum with an adjacent phlegmon. GI on board continue MiraLAX p.o. twice daily.  Obesity, Class III, BMI 40-49.9 (morbid obesity) Noted, has been counseled.  Asthma: Currently asymptomatic continue current medications.  Chronic anxiety/major depression: Continue current meds.  Essential hypertension Continue amlodipine. Her blood pressure slightly elevated due to pain.  hypercholesterolemia Continue statins.  Abnormal alkaline phosphatase test/  Transaminitis Resolved likely due to acute infectious etiology.    DVT prophylaxis: lovenox Family Communication:none Status is: Inpatient Remains inpatient appropriate because: Acute diverticulitis    Code Status:     Code Status Orders  (From admission, onward)           Start     Ordered   08/17/22 1454  Full code  Continuous       Question:  By:  Answer:  Consent: discussion documented in EHR   08/17/22 1454           Code Status History     Date Active Date Inactive Code Status Order ID Comments User Context   03/18/2013 1447 03/19/2013 1305 Full Code [id number]  Whitney Higgins Inpatient      Advance Directive  Documentation    Flowsheet Row Most Recent Value  Type of Advance Directive Healthcare Power of Attorney, Living will  Pre-existing out of facility DNR order (yellow form or pink MOST form) --  "MOST" Form in Place? --         IV Access:   Peripheral IV   Procedures and diagnostic studies:   CT ABDOMEN PELVIS W CONTRAST  Result Date: 08/20/2022 CLINICAL DATA:  Right lower quadrant pain. Crohn disease. Diverticulitis. EXAM: CT ABDOMEN AND PELVIS WITH CONTRAST TECHNIQUE: Multidetector CT imaging of the abdomen and pelvis was performed using the standard protocol following bolus administration of intravenous contrast. RADIATION DOSE REDUCTION: This exam was performed according to the departmental dose-optimization program which includes automated exposure control, adjustment of the mA and/or kV according to patient size and/or use of iterative reconstruction technique. CONTRAST:  100mL OMNIPAQUE IOHEXOL 300 MG/ML  SOLN COMPARISON:  08/16/2022 FINDINGS: Lower Chest: No acute findings. Hepatobiliary:  No hepatic masses identified. Pancreas:  No mass or inflammatory changes. Spleen: Within normal limits in size and appearance. Adrenals/Urinary Tract: No suspicious masses identified. No evidence of ureteral calculi or hydronephrosis. Stomach/Bowel: Moderate wall thickening and pericolonic soft tissue stranding is again seen involving the mid sigmoid colon. A loop of distal ileum is also seen at this site in the central pelvis which shows wall thickening and adjacent soft tissue stranding. Probable fistulae and spiculated mixed attenuation density are seen in the adjacent central pelvis which measures 5.0 x 2.9 cm, suspicious for phlegmon or early abscess. These findings show no significant change. Differential diagnosis  includes perforated diverticulitis with diverticular abscess and secondary involvement of small bowel, or penetrating Crohn disease with abscess and secondary involvement of sigmoid  colon. No evidence of free intraperitoneal air. Vascular/Lymphatic: No pathologically enlarged lymph nodes. No acute vascular findings. Reproductive: Prior hysterectomy noted. Adnexal regions are unremarkable in appearance. Other:  None. Musculoskeletal: No suspicious bone lesions identified. Right hip prosthesis again noted. IMPRESSION: No significant change in central pelvic inflammatory process involving the sigmoid colon and distal ileum, with adjacent phlegmon or early abscess. Differential diagnosis remains perforated sigmoid diverticulitis with diverticular abscess, and penetrating Crohn disease with abscess and secondary involvement of sigmoid colon. Electronically Signed   By: Whitney Higgins M.D.   On: 08/20/2022 16:31     Medical Consultants:   None.   Subjective:    Whitney Higgins relates her pain is better today.  Objective:    Vitals:   08/20/22 0510 08/20/22 1403 08/20/22 2258 08/21/22 0547  BP: (!) 143/73 127/72 (!) 145/78 (!) 120/55  Pulse: 80 93 83 80  Resp: 18 18 17 16   Temp: 97.9 F (36.6 C) 99.1 F (37.3 C) 99.5 F (37.5 C) 98.8 F (37.1 C)  TempSrc: Oral Oral Oral Oral  SpO2: 96% 92% 93% 90%  Weight:      Height:       SpO2: 90 %   Intake/Output Summary (Last 24 hours) at 08/21/2022 0734 Last data filed at 08/21/2022 0600 Gross per 24 hour  Intake 1327.48 ml  Output --  Net 1327.48 ml    Filed Weights   08/17/22 1744  Weight: 122.5 kg    Exam: General exam: In no acute distress. Respiratory system: Good air movement and clear to auscultation. Cardiovascular system: S1 & S2 heard, RRR. No JVD. Gastrointestinal system: Soft nondistended left lower quadrant tenderness Extremities: No pedal edema. Skin: No rashes, lesions or ulcers Psychiatry: Judgement and insight appear normal. Mood & affect appropriate.  Data Reviewed:    Labs: Basic Metabolic Panel: Recent Labs  Lab 08/15/22 1550 08/17/22 1359 08/18/22 0255 08/19/22 1006  NA 133* 133*  134* 133*  K 3.7 3.6 3.4* 3.7  CL 97 94* 97* 98  CO2 26 27 28 29   GLUCOSE 110* 107* 100* 128*  BUN 23 22 17 9   CREATININE 0.99 1.19* 0.92 0.89  CALCIUM 9.9 9.3 8.9 8.4*    GFR Estimated Creatinine Clearance: 78.1 mL/min (by C-G formula based on SCr of 0.89 mg/dL). Liver Function Tests: Recent Labs  Lab 08/15/22 1550 08/17/22 1359 08/18/22 0255  AST 45* 45* 32  ALT 55* 53* 44  ALKPHOS 143* 133* 102  BILITOT 0.4 0.4 0.5  PROT 8.0 7.9 6.9  ALBUMIN 3.5 3.0* 2.6*    Recent Labs  Lab 08/15/22 1550 08/17/22 1359  LIPASE 24.0 30  AMYLASE 25*  --     No results for input(s): "AMMONIA" in the last 168 hours. Coagulation profile No results for input(s): "INR", "PROTIME" in the last 168 hours. COVID-19 Labs  No results for input(s): "DDIMER", "FERRITIN", "LDH", "CRP" in the last 72 hours.   Lab Results  Component Value Date   SARSCOV2NAA RESULT:  NEGATIVE 04/01/2019    CBC: Recent Labs  Lab 08/15/22 1550 08/17/22 1359 08/18/22 0255 08/19/22 0336 08/20/22 0322 08/21/22 0325  WBC 13.1* 11.3* 7.3 8.6 8.5 8.5  NEUTROABS 10.7* 9.0*  --  6.5 6.4 6.4  HGB 12.1 11.7* 10.4* 10.2* 10.2* 10.1*  HCT 36.6 36.9 33.1* 32.6* 32.7* 32.4*  MCV 84.1 87.4 88.3 88.3 89.1  89.3  PLT 355.0 338 280 267 255 243    Cardiac Enzymes: No results for input(s): "CKTOTAL", "CKMB", "CKMBINDEX", "TROPONINI" in the last 168 hours. BNP (last 3 results) No results for input(s): "PROBNP" in the last 8760 hours. CBG: No results for input(s): "GLUCAP" in the last 168 hours. D-Dimer: No results for input(s): "DDIMER" in the last 72 hours. Hgb A1c: No results for input(s): "HGBA1C" in the last 72 hours. Lipid Profile: No results for input(s): "CHOL", "HDL", "LDLCALC", "TRIG", "CHOLHDL", "LDLDIRECT" in the last 72 hours. Thyroid function studies: No results for input(s): "TSH", "T4TOTAL", "T3FREE", "THYROIDAB" in the last 72 hours.  Invalid input(s): "FREET3" Anemia work up: No results for  input(s): "VITAMINB12", "FOLATE", "FERRITIN", "TIBC", "IRON", "RETICCTPCT" in the last 72 hours. Sepsis Labs: Recent Labs  Lab 08/18/22 0255 08/19/22 0336 08/20/22 0322 08/21/22 0325  WBC 7.3 8.6 8.5 8.5    Microbiology No results found for this or any previous visit (from the past 240 hour(s)).   Medications:    amLODipine  5 mg Oral Daily   levothyroxine  75 mcg Oral QAC breakfast   mesalamine  2.4 g Oral BID   montelukast  10 mg Oral QHS   pantoprazole  40 mg Oral Daily   polyethylene glycol  17 g Oral Daily   venlafaxine XR  150 mg Oral BID   Continuous Infusions:  sodium chloride 10 mL/hr at 08/19/22 2101   piperacillin-tazobactam (ZOSYN)  IV 3.375 g (08/21/22 0122)      LOS: 4 days   Marinda Elk  [facility]  08/21/2022, 7:34 AM

## 2022-08-21 NOTE — Plan of Care (Signed)
  Problem: Coping: Goal: Level of anxiety will decrease Outcome: Progressing   Problem: Pain Managment: Goal: General experience of comfort will improve Outcome: Progressing   

## 2022-08-22 DIAGNOSIS — R194 Change in bowel habit: Secondary | ICD-10-CM | POA: Diagnosis not present

## 2022-08-22 DIAGNOSIS — K625 Hemorrhage of anus and rectum: Secondary | ICD-10-CM | POA: Diagnosis not present

## 2022-08-22 DIAGNOSIS — K5090 Crohn's disease, unspecified, without complications: Secondary | ICD-10-CM | POA: Diagnosis not present

## 2022-08-22 DIAGNOSIS — I10 Essential (primary) hypertension: Secondary | ICD-10-CM | POA: Diagnosis not present

## 2022-08-22 DIAGNOSIS — R748 Abnormal levels of other serum enzymes: Secondary | ICD-10-CM | POA: Diagnosis not present

## 2022-08-22 DIAGNOSIS — K5792 Diverticulitis of intestine, part unspecified, without perforation or abscess without bleeding: Secondary | ICD-10-CM | POA: Diagnosis not present

## 2022-08-22 LAB — CBC WITH DIFFERENTIAL/PLATELET
Abs Immature Granulocytes: 0.05 10*3/uL (ref 0.00–0.07)
Basophils Absolute: 0 10*3/uL (ref 0.0–0.1)
Basophils Relative: 0 %
Eosinophils Absolute: 0 10*3/uL (ref 0.0–0.5)
Eosinophils Relative: 0 %
HCT: 32.4 % — ABNORMAL LOW (ref 36.0–46.0)
Hemoglobin: 10.2 g/dL — ABNORMAL LOW (ref 12.0–15.0)
Immature Granulocytes: 1 %
Lymphocytes Relative: 6 %
Lymphs Abs: 0.5 10*3/uL — ABNORMAL LOW (ref 0.7–4.0)
MCH: 27.9 pg (ref 26.0–34.0)
MCHC: 31.5 g/dL (ref 30.0–36.0)
MCV: 88.5 fL (ref 80.0–100.0)
Monocytes Absolute: 0.4 10*3/uL (ref 0.1–1.0)
Monocytes Relative: 5 %
Neutro Abs: 7.7 10*3/uL (ref 1.7–7.7)
Neutrophils Relative %: 88 %
Platelets: 262 10*3/uL (ref 150–400)
RBC: 3.66 MIL/uL — ABNORMAL LOW (ref 3.87–5.11)
RDW: 14.2 % (ref 11.5–15.5)
WBC: 8.7 10*3/uL (ref 4.0–10.5)
nRBC: 0 % (ref 0.0–0.2)

## 2022-08-22 LAB — CALPROTECTIN, FECAL: Calprotectin, Fecal: >8000 ug/g — ABNORMAL HIGH (ref 0–120)

## 2022-08-22 LAB — HEMOGLOBIN A1C
Hgb A1c MFr Bld: 6.4 % — ABNORMAL HIGH (ref 4.8–5.6)
Mean Plasma Glucose: 136.98 mg/dL

## 2022-08-22 LAB — GLUCOSE, CAPILLARY
Glucose-Capillary: 101 mg/dL — ABNORMAL HIGH (ref 70–99)
Glucose-Capillary: 172 mg/dL — ABNORMAL HIGH (ref 70–99)
Glucose-Capillary: 146 mg/dL — ABNORMAL HIGH (ref 70–99)
Glucose-Capillary: 150 mg/dL — ABNORMAL HIGH (ref 70–99)

## 2022-08-22 MED ORDER — INSULIN ASPART 100 UNIT/ML IJ SOLN: 0.0000 [IU] | Freq: Every day | INTRAMUSCULAR | Status: DC

## 2022-08-22 MED ORDER — INSULIN ASPART 100 UNIT/ML IJ SOLN
3.0000 [IU] | Freq: Three times a day (TID) | INTRAMUSCULAR | Status: DC
  Administered 2022-08-22 – 2022-09-04 (×21): 3 [IU] via SUBCUTANEOUS

## 2022-08-22 MED ORDER — INSULIN ASPART 100 UNIT/ML IJ SOLN
0.0000 [IU] | Freq: Three times a day (TID) | INTRAMUSCULAR | Status: DC
  Administered 2022-08-22: 2 [IU] via SUBCUTANEOUS
  Administered 2022-08-22: 3 [IU] via SUBCUTANEOUS
  Administered 2022-08-23 – 2022-08-24 (×3): 2 [IU] via SUBCUTANEOUS
  Administered 2022-08-25: 5 [IU] via SUBCUTANEOUS
  Administered 2022-08-26: 2 [IU] via SUBCUTANEOUS
  Administered 2022-08-27 – 2022-08-29 (×3): 3 [IU] via SUBCUTANEOUS
  Administered 2022-08-30 (×2): 2 [IU] via SUBCUTANEOUS

## 2022-08-22 NOTE — Plan of Care (Signed)
  Problem: Pain Managment: Goal: General experience of comfort will improve Outcome: Progressing   Problem: Metabolic: Goal: Ability to maintain appropriate glucose levels will improve Outcome: Progressing   Problem: Nutritional: Goal: Maintenance of adequate nutrition will improve Outcome: Progressing

## 2022-08-22 NOTE — Plan of Care (Signed)

## 2022-08-22 NOTE — Progress Notes (Signed)
Mobility Specialist - Progress Note   08/22/22 1040  Mobility  Activity Ambulated with assistance in hallway  Level of Assistance Modified independent, requires aide device or extra time  Assistive Device [place];Other (Comment) (HHA)  Distance Ambulated (ft) 120 ft  Activity Response Tolerated well  Mobility Referral Yes  $Mobility charge 1 Mobility   Pt received in bed and agreeable to mobility. During ambulation, pt held on to walls. Pt to EOB after session with all needs met & husband in room.   [facility]

## 2022-08-22 NOTE — Progress Notes (Signed)
[facility] PROGRESS NOTE    Progress Note  Whitney Higgins  [id number]:[id number] DOB: February 11, 1956 DOA: 08/17/2022 PCP: Montez Hageman, DO     Brief Narrative:   Whitney Higgins is an 67 y.o. female past medical history significant for seasonal allergies anxiety asthma chronic back pain inflammatory bowel disease sent by the GI doctor to the emergency room for abdominal pain CT scan of the abdomen and pelvis showed inflammatory changes and probably phlegmon in the sigmoid mesocolon consistent with acute diverticulitis   Assessment/Plan:   Acute diverticulitis superimposed on Crohn's colitis: Continue IV Zosyn.  She has remained afebrile. She is tolerating her diet. CT of the abdomen and pelvis showed no significant changes in pelvic inflammatory process of the sigmoid colon and distal ileum with an adjacent phlegmon. GI consulted recommended IV Solu-Medrol, depending on response repeat a CT scan in 3 to 4 days. Check hepatitis panel, TB Gold, TPMT in anticipation of outpatient biological. Check CBGs and DC on a chest now that we have started her on steroids  Hyperglycemia: Check CBGs before meals and at bedtime now that we have started her on steroids. She has no history of diabetes mellitus, check an A1c.  Obesity, Class III, BMI 40-49.9 (morbid obesity) Noted, has been counseled.  Asthma: Currently asymptomatic continue current medications.  Chronic anxiety/major depression: Continue current meds.  Essential hypertension Continue amlodipine. Her blood pressure slightly elevated due to pain.  hypercholesterolemia Continue statins.  Abnormal alkaline phosphatase test/  Transaminitis Resolved likely due to acute infectious etiology.    DVT prophylaxis: lovenox Family Communication:none Status is: Inpatient Remains inpatient appropriate because: Acute diverticulitis    Code Status:     Code Status Orders  (From admission, onward)           Start     Ordered    08/17/22 1454  Full code  Continuous       Question:  By:  Answer:  Consent: discussion documented in EHR   08/17/22 1454           Code Status History     Date Active Date Inactive Code Status Order ID Comments User Context   03/18/2013 1447 03/19/2013 1305 Full Code [id number]  Kritzer, Rolanda Lundborg, MD Inpatient      Advance Directive Documentation    Flowsheet Row Most Recent Value  Type of Advance Directive Healthcare Power of Attorney, Living will  Pre-existing out of facility DNR order (yellow form or pink MOST form) --  "MOST" Form in Place? --         IV Access:   Peripheral IV   Procedures and diagnostic studies:   CT ABDOMEN PELVIS W CONTRAST  Result Date: 08/20/2022 CLINICAL DATA:  Right lower quadrant pain. Crohn disease. Diverticulitis. EXAM: CT ABDOMEN AND PELVIS WITH CONTRAST TECHNIQUE: Multidetector CT imaging of the abdomen and pelvis was performed using the standard protocol following bolus administration of intravenous contrast. RADIATION DOSE REDUCTION: This exam was performed according to the departmental dose-optimization program which includes automated exposure control, adjustment of the mA and/or kV according to patient size and/or use of iterative reconstruction technique. CONTRAST:  OMNIPAQUE IOHEXOL 300 MG/ML  SOLN COMPARISON:  08/16/2022 FINDINGS: Lower Chest: No acute findings. Hepatobiliary:  No hepatic masses identified. Pancreas:  No mass or inflammatory changes. Spleen: Within normal limits in size and appearance. Adrenals/Urinary Tract: No suspicious masses identified. No evidence of ureteral calculi or hydronephrosis. Stomach/Bowel: Moderate wall thickening and pericolonic soft tissue stranding is again seen involving  the mid sigmoid colon. A loop of distal ileum is also seen at this site in the central pelvis which shows wall thickening and adjacent soft tissue stranding. Probable fistulae and spiculated mixed attenuation density are seen in  the adjacent central pelvis which measures 5.0 x 2.9 cm, suspicious for phlegmon or early abscess. These findings show no significant change. Differential diagnosis includes perforated diverticulitis with diverticular abscess and secondary involvement of small bowel, or penetrating Crohn disease with abscess and secondary involvement of sigmoid colon. No evidence of free intraperitoneal air. Vascular/Lymphatic: No pathologically enlarged lymph nodes. No acute vascular findings. Reproductive: Prior hysterectomy noted. Adnexal regions are unremarkable in appearance. Other:  None. Musculoskeletal: No suspicious bone lesions identified. Right hip prosthesis again noted. IMPRESSION: No significant change in central pelvic inflammatory process involving the sigmoid colon and distal ileum, with adjacent phlegmon or early abscess. Differential diagnosis remains perforated sigmoid diverticulitis with diverticular abscess, and penetrating Crohn disease with abscess and secondary involvement of sigmoid colon. Electronically Signed   By: Danae Orleans M.D.   On: 08/20/2022 16:31     Medical Consultants:   None.   Subjective:    Whitney Higgins relates her pain is better today.  Objective:    Vitals:   08/21/22 0900 08/21/22 1508 08/21/22 2236 08/22/22 0516  BP: (!) 139/59 (!) 129/50 (!) 128/54 131/65  Pulse: 79 91 85 79  Resp:  18 18 16   Temp:  98.8 F (37.1 C) 98.7 F (37.1 C) 98.6 F (37 C)  TempSrc:      SpO2:  96% 97% 90%  Weight:      Height:       SpO2: 90 %   Intake/Output Summary (Last 24 hours) at 08/22/2022 0734 Last data filed at 08/22/2022 0600 Gross per 24 hour  Intake 385.57 ml  Output --  Net 385.57 ml    Filed Weights   08/17/22 1744  Weight: 122.5 kg    Exam: General exam: In no acute distress. Respiratory system: Good air movement and clear to auscultation. Cardiovascular system: S1 & S2 heard, RRR. No JVD. Gastrointestinal system: Soft nondistended left lower  quadrant tenderness Extremities: No pedal edema. Skin: No rashes, lesions or ulcers Psychiatry: Judgement and insight appear normal. Mood & affect appropriate.  Data Reviewed:    Labs: Basic Metabolic Panel: Recent Labs  Lab 08/15/22 1550 08/17/22 1359 08/18/22 0255 08/19/22 1006  NA 133* 133* 134* 133*  K 3.7 3.6 3.4* 3.7  CL 97 94* 97* 98  CO2 26 27 28 29   GLUCOSE 110* 107* 100* 128*  BUN 23 22 17 9   CREATININE 0.99 1.19* 0.92 0.89  CALCIUM 9.9 9.3 8.9 8.4*    GFR Estimated Creatinine Clearance: 78.1 mL/min (by C-G formula based on SCr of 0.89 mg/dL). Liver Function Tests: Recent Labs  Lab 08/15/22 1550 08/17/22 1359 08/18/22 0255  AST 45* 45* 32  ALT 55* 53* 44  ALKPHOS 143* 133* 102  BILITOT 0.4 0.4 0.5  PROT 8.0 7.9 6.9  ALBUMIN 3.5 3.0* 2.6*    Recent Labs  Lab 08/15/22 1550 08/17/22 1359  LIPASE 24.0 30  AMYLASE 25*  --     No results for input(s): "AMMONIA" in the last 168 hours. Coagulation profile No results for input(s): "INR", "PROTIME" in the last 168 hours. COVID-19 Labs  No results for input(s): "DDIMER", "FERRITIN", "LDH", "CRP" in the last 72 hours.   Lab Results  Component Value Date   SARSCOV2NAA RESULT:  NEGATIVE 04/01/2019  CBC: Recent Labs  Lab 08/17/22 1359 08/18/22 0255 08/19/22 0336 08/20/22 0322 08/21/22 0325 08/22/22 0320  WBC 11.3* 7.3 8.6 8.5 8.5 8.7  NEUTROABS 9.0*  --  6.5 6.4 6.4 7.7  HGB 11.7* 10.4* 10.2* 10.2* 10.1* 10.2*  HCT 36.9 33.1* 32.6* 32.7* 32.4* 32.4*  MCV 87.4 88.3 88.3 89.1 89.3 88.5  PLT 338 280 267 255 243 262    Cardiac Enzymes: No results for input(s): "CKTOTAL", "CKMB", "CKMBINDEX", "TROPONINI" in the last 168 hours. BNP (last 3 results) No results for input(s): "PROBNP" in the last 8760 hours. CBG: No results for input(s): "GLUCAP" in the last 168 hours. D-Dimer: No results for input(s): "DDIMER" in the last 72 hours. Hgb A1c: No results for input(s): "HGBA1C" in the last 72  hours. Lipid Profile: No results for input(s): "CHOL", "HDL", "LDLCALC", "TRIG", "CHOLHDL", "LDLDIRECT" in the last 72 hours. Thyroid function studies: No results for input(s): "TSH", "T4TOTAL", "T3FREE", "THYROIDAB" in the last 72 hours.  Invalid input(s): "FREET3" Anemia work up: No results for input(s): "VITAMINB12", "FOLATE", "FERRITIN", "TIBC", "IRON", "RETICCTPCT" in the last 72 hours. Sepsis Labs: Recent Labs  Lab 08/19/22 0336 08/20/22 0322 08/21/22 0325 08/22/22 0320  WBC 8.6 8.5 8.5 8.7    Microbiology No results found for this or any previous visit (from the past 240 hour(s)).   Medications:    amLODipine  5 mg Oral Daily   levothyroxine  75 mcg Oral QAC breakfast   mesalamine  2.4 g Oral BID   methylPREDNISolone (SOLU-MEDROL) injection  40 mg Intravenous Daily   montelukast  10 mg Oral QHS   pantoprazole  40 mg Oral Daily   polyethylene glycol  17 g Oral Daily   venlafaxine XR  150 mg Oral BID   Continuous Infusions:  sodium chloride 10 mL/hr at 08/19/22 2101   piperacillin-tazobactam (ZOSYN)  IV 3.375 g (08/22/22 0221)      LOS: 5 days   Marinda ElkAbraham Feliz Ortiz  [facility]  08/22/2022, 7:34 AM

## 2022-08-22 NOTE — Plan of Care (Signed)
  Problem: Coping: Goal: Level of anxiety will decrease Outcome: Progressing   Problem: Pain Managment: Goal: General experience of comfort will improve Outcome: Progressing   

## 2022-08-22 NOTE — Progress Notes (Addendum)
Patient ID: Whitney Higgins, female   DOB: Dec 10, 1955, 67 y.o.   MRN: [id number]    Progress Note   Subjective  Day # 5 CC; history of Crohn's colitis/exacerbation of Crohn's colitis with phlegmon versus diverticulitis with phlegmon sigmoid colon  IV Zosyn day 2 Solu-Medrol day 2  Fecal calprotectin pending QuantiFERON gold pending TPMT pending  WBC 8.7/hemoglobin 10.2/hematocrit 32.4  Patient had been up showering etc. says she feels better today in general and definitely less abdominal pain not required any analgesics today-having small-volume bowel movements with MiraLAX  Husband at bedside   Objective   Vital signs in last 24 hours: Temp:  [98.6 F (37 C)-98.8 F (37.1 C)] 98.6 F (37 C) (04/09 0516) Pulse Rate:  [79-91] 79 (04/09 0516) Resp:  [16-18] 16 (04/09 0516) BP: (128-139)/(50-65) 131/65 (04/09 0516) SpO2:  [90 %-97 %] 90 % (04/09 0516) Last BM Date : 08/20/22 General:   Older white female in NAD Heart:  Regular rate and rhythm; no murmurs Lungs: Respirations even and unlabored, lungs CTA bilaterally Abdomen:  Soft, obese, less tender in the low mid abdomen and left lower quadrant normal bowel sounds. Extremities:  Without edema. Neurologic:  Alert and oriented,  grossly normal neurologically. Psych:  Cooperative. Normal mood and affect.  Intake/Output from previous day: 04/08 0701 - 04/09 0700 In: 385.6 [P.O.:240; IV Piggyback:145.6] Out: -  Intake/Output this shift: No intake/output data recorded.  Lab Results: Recent Labs    08/20/22 0322 08/21/22 0325 08/22/22 0320  WBC 8.5 8.5 8.7  HGB 10.2* 10.1* 10.2*  HCT 32.7* 32.4* 32.4*  PLT 255 243 262   BMET Recent Labs    08/19/22 1006  NA 133*  K 3.7  CL 98  CO2 29  GLUCOSE 128*  BUN 9  CREATININE 0.89  CALCIUM 8.4*   LFT No results for input(s): "PROT", "ALBUMIN", "AST", "ALT", "ALKPHOS", "BILITOT", "BILIDIR", "IBILI" in the last 72 hours. PT/INR No results for input(s): "LABPROT",  "INR" in the last 72 hours.  Studies/Results: CT ABDOMEN PELVIS W CONTRAST  Result Date: 08/20/2022 CLINICAL DATA:  Right lower quadrant pain. Crohn disease. Diverticulitis. EXAM: CT ABDOMEN AND PELVIS WITH CONTRAST TECHNIQUE: Multidetector CT imaging of the abdomen and pelvis was performed using the standard protocol following bolus administration of intravenous contrast. RADIATION DOSE REDUCTION: This exam was performed according to the departmental dose-optimization program which includes automated exposure control, adjustment of the mA and/or kV according to patient size and/or use of iterative reconstruction technique. CONTRAST:  OMNIPAQUE IOHEXOL 300 MG/ML  SOLN COMPARISON:  08/16/2022 FINDINGS: Lower Chest: No acute findings. Hepatobiliary:  No hepatic masses identified. Pancreas:  No mass or inflammatory changes. Spleen: Within normal limits in size and appearance. Adrenals/Urinary Tract: No suspicious masses identified. No evidence of ureteral calculi or hydronephrosis. Stomach/Bowel: Moderate wall thickening and pericolonic soft tissue stranding is again seen involving the mid sigmoid colon. A loop of distal ileum is also seen at this site in the central pelvis which shows wall thickening and adjacent soft tissue stranding. Probable fistulae and spiculated mixed attenuation density are seen in the adjacent central pelvis which measures 5.0 x 2.9 cm, suspicious for phlegmon or early abscess. These findings show no significant change. Differential diagnosis includes perforated diverticulitis with diverticular abscess and secondary involvement of small bowel, or penetrating Crohn disease with abscess and secondary involvement of sigmoid colon. No evidence of free intraperitoneal air. Vascular/Lymphatic: No pathologically enlarged lymph nodes. No acute vascular findings. Reproductive: Prior hysterectomy noted. Adnexal regions  are unremarkable in appearance. Other:  None. Musculoskeletal: No  suspicious bone lesions identified. Right hip prosthesis again noted. IMPRESSION: No significant change in central pelvic inflammatory process involving the sigmoid colon and distal ileum, with adjacent phlegmon or early abscess. Differential diagnosis remains perforated sigmoid diverticulitis with diverticular abscess, and penetrating Crohn disease with abscess and secondary involvement of sigmoid colon. Electronically Signed   By: Danae Orleans M.D.   On: 08/20/2022 16:31       Assessment / Plan:    #79 67 year old white female with history of Crohn's colitis involving left colon admitted with subacute symptoms present over the past couple of months with change in bowel habits loose stools alternating with constipation and intermittent rectal bleeding and persistent gradually progressive lower abdominal pain/discomfort  Initial imaging as an outpatient was severe inflammatory process of the sigmoid and descending colon and probable associated phlegmon in the sigmoid mesocolon-unclear whether this was related to Crohn's exacerbation versus diverticulitis  Initially treated with IV Cipro and Flagyl, transition to Zosyn as no real improvement.  Repeat CT 08/20/2022-shows persistent moderate wall thickening and soft tissue stranding mid sigmoid colon also involving a loop of distal ileum at this site with associated wall thickening and soft tissue stranding, questionable fistula and a spiculated mixed attenuation density in the central pelvis 5 cm x 2.9 cm suspicious for phlegmon versus early abscess Differential includes perforated diverticulitis with diverticular abscess and secondary involvement of small bowel versus penetrating Crohn's disease with developing abscess and involvement of a loop of distal ileum and sigmoid  Started IV Solu-Medrol yesterday Continued IV Zosyn  Definitely feels better today and is less tender on exam  #2 anemia stable #3 obesity   Plan; continue IV Zosyn Continue  Solu-Medrol 40 mg daily DVT prophylaxis As patient is improved today we will hold on surgical consult.  As long as she continues to gradually improve would plan for repeat CT in 3 to 4 days and if needed IR consultation if the phlegmon shores into an abscess which requires draining.    Principal Problem:   Acute diverticulitis Active Problems:   Crohn's colitis (HCC) - left-sided   Obesity, Class III, BMI 40-49.9 (morbid obesity)   Acquired hypothyroidism   Asthma   Chronic anxiety   Essential hypertension   Hypercholesterolemia   Major depressive disorder   Abnormal alkaline phosphatase test   Transaminitis   Abnormal CT scan, colon   Abdominal pain, left lower quadrant   RLQ abdominal pain     LOS: 5 days   Amy Esterwood PA-C 08/22/2022, 8:47 AM     Attending physician's note   I have taken history, reviewed the chart and examined the patient. I performed a substantive portion of this encounter, including complete performance of at least one of the key components, in conjunction with the APP. I agree with the Advanced Practitioner's note, impression and recommendations.   Much improved on Solu-Medrol 40 IV QD Continue Zosyn  CT AP - likely 4/12 Plans for Remicade as outpt. TB Gold, hepatitis panel pending   Edman Circle, MD [facility] [phone]

## 2022-08-23 ENCOUNTER — Telehealth: Payer: Self-pay

## 2022-08-23 ENCOUNTER — Other Ambulatory Visit: Payer: Self-pay

## 2022-08-23 DIAGNOSIS — B3731 Acute candidiasis of vulva and vagina: Secondary | ICD-10-CM

## 2022-08-23 DIAGNOSIS — K50919 Crohn's disease, unspecified, with unspecified complications: Secondary | ICD-10-CM

## 2022-08-23 DIAGNOSIS — I10 Essential (primary) hypertension: Secondary | ICD-10-CM | POA: Diagnosis not present

## 2022-08-23 DIAGNOSIS — K625 Hemorrhage of anus and rectum: Secondary | ICD-10-CM | POA: Diagnosis not present

## 2022-08-23 DIAGNOSIS — K5090 Crohn's disease, unspecified, without complications: Secondary | ICD-10-CM | POA: Diagnosis not present

## 2022-08-23 DIAGNOSIS — R194 Change in bowel habit: Secondary | ICD-10-CM | POA: Diagnosis not present

## 2022-08-23 DIAGNOSIS — K5792 Diverticulitis of intestine, part unspecified, without perforation or abscess without bleeding: Secondary | ICD-10-CM | POA: Diagnosis not present

## 2022-08-23 LAB — CBC WITH DIFFERENTIAL/PLATELET
Abs Immature Granulocytes: 0.07 10*3/uL (ref 0.00–0.07)
Basophils Absolute: 0 10*3/uL (ref 0.0–0.1)
Basophils Relative: 0 %
Eosinophils Absolute: 0 10*3/uL (ref 0.0–0.5)
Eosinophils Relative: 0 %
HCT: 32.9 % — ABNORMAL LOW (ref 36.0–46.0)
Hemoglobin: 10.3 g/dL — ABNORMAL LOW (ref 12.0–15.0)
Immature Granulocytes: 1 %
Lymphocytes Relative: 10 %
Lymphs Abs: 1 10*3/uL (ref 0.7–4.0)
MCH: 27.8 pg (ref 26.0–34.0)
MCHC: 31.3 g/dL (ref 30.0–36.0)
MCV: 88.7 fL (ref 80.0–100.0)
Monocytes Absolute: 0.7 10*3/uL (ref 0.1–1.0)
Monocytes Relative: 6 %
Neutro Abs: 8.9 10*3/uL — ABNORMAL HIGH (ref 1.7–7.7)
Neutrophils Relative %: 83 %
Platelets: 278 10*3/uL (ref 150–400)
RBC: 3.71 MIL/uL — ABNORMAL LOW (ref 3.87–5.11)
RDW: 14.5 % (ref 11.5–15.5)
WBC: 10.7 10*3/uL — ABNORMAL HIGH (ref 4.0–10.5)
nRBC: 0 % (ref 0.0–0.2)

## 2022-08-23 LAB — BASIC METABOLIC PANEL
Anion gap: 5 (ref 5–15)
BUN: 20 mg/dL (ref 8–23)
CO2: 29 mmol/L (ref 22–32)
Calcium: 8.2 mg/dL — ABNORMAL LOW (ref 8.9–10.3)
Chloride: 102 mmol/L (ref 98–111)
Creatinine, Ser: 0.81 mg/dL (ref 0.44–1.00)
GFR, Estimated: >60 mL/min (ref >60)
Glucose, Bld: 98 mg/dL (ref 70–99)
Potassium: 3.4 mmol/L — ABNORMAL LOW (ref 3.5–5.1)
Sodium: 136 mmol/L (ref 135–145)

## 2022-08-23 LAB — GLUCOSE, CAPILLARY
Glucose-Capillary: 80 mg/dL (ref 70–99)
Glucose-Capillary: 108 mg/dL — ABNORMAL HIGH (ref 70–99)
Glucose-Capillary: 144 mg/dL — ABNORMAL HIGH (ref 70–99)
Glucose-Capillary: 126 mg/dL — ABNORMAL HIGH (ref 70–99)

## 2022-08-23 LAB — HEPATITIS B SURFACE ANTIBODY,QUALITATIVE: Hep B S Ab: NONREACTIVE

## 2022-08-23 MED ORDER — FLUCONAZOLE 150 MG PO TABS: 150.0000 mg | ORAL_TABLET | Freq: Every day | ORAL | 0 refills | Status: DC

## 2022-08-23 MED ORDER — POTASSIUM CHLORIDE CRYS ER 20 MEQ PO TBCR
40.0000 meq | EXTENDED_RELEASE_TABLET | Freq: Once | ORAL | Status: AC
  Administered 2022-08-23: 40 meq via ORAL
  Filled 2022-08-23: qty 2

## 2022-08-23 NOTE — Plan of Care (Signed)
Plan of care reviewed and discussed. °

## 2022-08-23 NOTE — Telephone Encounter (Signed)
Pt made aware of Amy Esterwood PA recommendations  Prescription sent to pharmacy: Pt made aware.  Pt verbalized understanding with all questions answered.

## 2022-08-23 NOTE — Telephone Encounter (Signed)
diflucan Received: Today Esterwood, Amy S, PA-C  Emeline Darling, RN Please send a prescription for Diflucan 150 mg take 1 tablet, repeat in 72 hours if persistent symptoms number 2 tablets no refills to her regular pharmacy -thank you  Reason, vaginal candidiasis

## 2022-08-23 NOTE — Telephone Encounter (Signed)
Please see note below and advise on prior Auth process with her insurance to see what biologic they will cover for severe Crohn's colitis/probably fistulizing And let us know what will be covered so we can start working on getting this lined up as an outpatient thanks

## 2022-08-23 NOTE — Telephone Encounter (Signed)
Please disregard message below: do not start any prior auth stuff.

## 2022-08-23 NOTE — Progress Notes (Addendum)
Patient ID: Whitney Higgins, female   DOB: 09-14-1955, 67 y.o.   MRN: [id number]    Progress Note   Subjective   Day # 6  CC; history of Crohn's colitis/exacerbation of Crohn's colitis with phlegmon versus diverticulitis with phlegmon sigmoid colon  IV Zosyn day 3(Cipro and Flagyl earlier in admission) IV Solu-Medrol day 3  WBC 10.7/hemoglobin 10.3/hematocrit 32.9 Potassium 3.4/BUN 20/creatinine 0.81  Fecal calprotectin greater than 8000 QuantiFERON gold pending TPMT pending Hepatitis B surface antigen negative/hep C negative/hep B surface antibody pending  Patient says she felt well all day yesterday did not have any significant abdominal pain did not require any analgesics.  Bowels are moving much better at this point with MiraLAX daily Abdominal pain resurfaced at this morning, more in the left abdomen-already feeling a bit better after p.o. Tylenol and IV Solu-Medrol   Objective   Vital signs in last 24 hours: Temp:  [97.8 F (36.6 C)-98.8 F (37.1 C)] 98 F (36.7 C) (04/10 0727) Pulse Rate:  [81-88] 84 (04/10 0727) Resp:  [17-18] 17 (04/10 0727) BP: (132-159)/(67-84) 159/84 (04/10 0727) SpO2:  [91 %-96 %] 96 % (04/10 0727) Last BM Date : 08/23/22 General:   Older white female in NAD Heart:  Regular rate and rhythm; no murmurs Lungs: Respirations even and unlabored, lungs CTA bilaterally Abdomen:  Soft, obese , there is mild tenderness in the low lower abdomen and left greater than right lower quadrants though significantly less tender than she had been a few days ago, no guarding and no rebound ,normal bowel sounds. Extremities:  Without edema. Neurologic:  Alert and oriented,  grossly normal neurologically. Psych:  Cooperative. Normal mood and affect.  Intake/Output from previous day: 04/09 0701 - 04/10 0700 In: 624.5 [P.O.:480; I.V.:40; IV Piggyback:104.5] Out: -  Intake/Output this shift: No intake/output data recorded.  Lab Results: Recent Labs     08/21/22 0325 08/22/22 0320 08/23/22 0333  WBC 8.5 8.7 10.7*  HGB 10.1* 10.2* 10.3*  HCT 32.4* 32.4* 32.9*  PLT 243 262 278   BMET Recent Labs    08/23/22 0332  NA 136  K 3.4*  CL 102  CO2 29  GLUCOSE 98  BUN 20  CREATININE 0.81  CALCIUM 8.2*   LFT No results for input(s): "PROT", "ALBUMIN", "AST", "ALT", "ALKPHOS", "BILITOT", "BILIDIR", "IBILI" in the last 72 hours. PT/INR No results for input(s): "LABPROT", "INR" in the last 72 hours.      Assessment / Plan:    #90 67 year old white female with history of left-sided Crohn's colitis admitted with 2 to 3 months of symptoms with change in bowel habits, loose stools alternating with constipation, intermittent rectal bleeding and persistent gradually progressive lower abdominal pain/discomfort  Initial imaging the day prior to admission with finding of severe inflammatory process of the sigmoid and descending colon, probable associated phlegmon in the sigmoid mesocolon unclear whether this was related to Crohn's exacerbation versus diverticulitis.  Initially treated with IV Cipro and Flagyl then converted to IV Zosyn.  Repeat CT scan 08/20/2022 showed persistent moderate wall thickening and soft tissue stranding in the mid sigmoid, also involving a loop of distal ileum with associated wall thickening soft tissue stranding questionable fistula and a spiculated mixed attenuation density in the central pelvis 5 cm x 2.9 cm suspicious for phlegmon versus early abscess. Differential still includes perforated diverticulitis with diverticular abscess versus more likely subacute Crohn's exacerbation with penetrating Crohn's disease involving the colon and at least 1 loop of distal ileum with phlegmon versus  developing abscess  Started on IV Solu-Medrol 08/21/2022-significant improvement in symptoms since starting IV Solu-Medrol.  #2 normocytic anemia stable #3 mild hypokalemia-correcting #4.  Obesity  Plan; continue solid diet Continue  IV Zosyn Continue IV Solu-Medrol Current plan is for repeat CT on Friday, then can make further recommendations regarding transitioning to oral antibiotics and oral steroids and/or determining if she needs IR drainage. We have begun discussions regarding biologic therapy-initial labs have been sent I will send a note to Dr. Leone PayorGessner, and his nurse regarding beginning the prior authorization process for potentially Remicade/Humira/Entyvio-Entyvio may be a good choice for her.      Principal Problem:   Acute diverticulitis Active Problems:   Crohn's colitis (HCC) - left-sided   Obesity, Class III, BMI 40-49.9 (morbid obesity)   Acquired hypothyroidism   Asthma   Chronic anxiety   Essential hypertension   Hypercholesterolemia   Major depressive disorder   Abnormal alkaline phosphatase test   Transaminitis   Abnormal CT scan, colon   Abdominal pain, left lower quadrant   RLQ abdominal pain     LOS: 6 days   Amy Esterwood PA-C 08/23/2022, 8:37 AM    Attending physician's note   I have taken history, reviewed the chart and examined the patient. I performed a substantive portion of this encounter, including complete performance of at least one of the key components, in conjunction with the APP. I agree with the Advanced Practitioner's note, impression and recommendations.   Much improved.  But, some diarrhea on MiraLAX with LLQ spasmodic pain.  Continue IV Zosyn Continue Solu-Medrol (day 3) Stop MiraLAX  Decided to hold off on Bentyl (d/t ?Allergies)  Plan to repeat CT Abdo/pelvis with contrast on 4/12 Trend labs. Will follow along.   Edman Circleaj Emrah Ariola, MD [facility] [phone]

## 2022-08-23 NOTE — Progress Notes (Signed)
Midnight Zosyn did not run properly at the pump as the time on [facility], contacted pharmacy to reschedule [facility] based on actual run time.

## 2022-08-23 NOTE — Progress Notes (Addendum)
[facility] PROGRESS NOTE    Progress Note  Whitney Higgins  TLX:[id number] DOB: Nov 10, 1955 DOA: 08/17/2022 PCP: Montez Hageman, DO     Brief Narrative:   Whitney Higgins is an 67 y.o. female past medical history significant for seasonal allergies anxiety asthma chronic back pain inflammatory bowel disease sent by the GI doctor to the emergency room for abdominal pain CT scan of the abdomen and pelvis showed inflammatory changes and probably phlegmon in the sigmoid mesocolon consistent with acute diverticulitis   Assessment/Plan:   Acute diverticulitis superimposed on Crohn's colitis: CT of the abdomen and pelvis showed no significant changes in pelvic inflammatory process of the sigmoid colon and distal ileum with an adjacent phlegmon. Patient was initially placed on Zosyn. Subsequently gastroenterology was consulted.  Steroids were initiated. Patient reports improvement in symptoms. Continue steroids and Zosyn for now.  Plan is to repeat CT scan in a few days. She has considering initiating biological agents in the outpatient setting.  Hyperglycemia: Elevated glucose levels are secondary to steroids.  She has no history of diabetes.  HbA1c 6.4.    Normocytic anemia No evidence of overt blood loss.  Drop in hemoglobin is likely dilutional.  Continue to monitor.  Hypokalemia Replete.  Obesity, Class III, BMI 40-49.9 (morbid obesity) Estimated body mass index is 46.37 kg/m as calculated from the following:   Height as of this encounter: 5\' 4"  (1.626 m).   Weight as of this encounter: 122.5 kg.  Asthma: Currently asymptomatic continue current medications.  Chronic anxiety/major depression: Continue current meds.  Essential hypertension Blood pressure is reasonably well-controlled.  Continue amlodipine.  Hypothyroidism Continue levothyroxine.  hypercholesterolemia Continue statins.  Abnormal alkaline phosphatase test/  Transaminitis Resolved likely due to acute  infectious etiology.  DVT prophylaxis: lovenox CODE STATUS: Full code Family Communication: Discussed with patient Disposition: Hopefully return home when improved.  Mobilize.  Status is: Inpatient Remains inpatient appropriate because: Acute diverticulitis    IV Access:   Peripheral IV   Procedures and diagnostic studies:   No results found.   Medical Consultants:   Gastroenterology  Subjective:   Patient denies any abdominal pain this morning.  No nausea or vomiting.  No shortness of breath.  Objective:    Vitals:   08/22/22 1238 08/22/22 2005 08/23/22 0501 08/23/22 0727  BP: (!) 154/67 (!) 148/75 132/73 (!) 159/84  Pulse: 81 88 82 84  Resp: 18 18 18 17   Temp: 97.8 F (36.6 C) 98.1 F (36.7 C) 98.8 F (37.1 C) 98 F (36.7 C)  TempSrc:  Oral Oral Oral  SpO2: 96% 91% 92% 96%  Weight:      Height:       SpO2: 96 %   Intake/Output Summary (Last 24 hours) at 08/23/2022 0955 Last data filed at 08/23/2022 0500 Gross per 24 hour  Intake 624.52 ml  Output --  Net 624.52 ml    Filed Weights   08/17/22 1744  Weight: 122.5 kg    Exam:  General appearance: Awake alert.  In no distress Resp: Clear to auscultation bilaterally.  Normal effort Cardio: S1-S2 is normal regular.  No S3-S4.  No rubs murmurs or bruit GI: Abdomen is soft.  Nontender nondistended.  Bowel sounds are present normal.  No masses organomegaly Extremities: No edema.  Full range of motion of lower extremities. Neurologic: Alert and oriented x3.  No focal neurological deficits.    Data Reviewed:    Labs: Basic Metabolic Panel: Recent Labs  Lab 08/17/22 1359 08/18/22 0255  08/19/22 1006 08/23/22 0332  NA 133* 134* 133* 136  K 3.6 3.4* 3.7 3.4*  CL 94* 97* 98 102  CO2 27 28 29 29   GLUCOSE 107* 100* 128* 98  BUN 22 17 9 20   CREATININE 1.19* 0.92 0.89 0.81  CALCIUM 9.3 8.9 8.4* 8.2*    GFR Estimated Creatinine Clearance: 85.8 mL/min (by C-G formula based on SCr of 0.81  mg/dL).  Liver Function Tests: Recent Labs  Lab 08/17/22 1359 08/18/22 0255  AST 45* 32  ALT 53* 44  ALKPHOS 133* 102  BILITOT 0.4 0.5  PROT 7.9 6.9  ALBUMIN 3.0* 2.6*    Recent Labs  Lab 08/17/22 1359  LIPASE 30        CBC: Recent Labs  Lab 08/19/22 0336 08/20/22 0322 08/21/22 0325 08/22/22 0320 08/23/22 0333  WBC 8.6 8.5 8.5 8.7 10.7*  NEUTROABS 6.5 6.4 6.4 7.7 8.9*  HGB 10.2* 10.2* 10.1* 10.2* 10.3*  HCT 32.6* 32.7* 32.4* 32.4* 32.9*  MCV 88.3 89.1 89.3 88.5 88.7  PLT 267 255 243 262 278     CBG: Recent Labs  Lab 08/22/22 0808 08/22/22 0859 08/22/22 1646 08/22/22 2206 08/23/22 0733  GLUCAP 101* 172* 146* 150* 80   Hgb A1c: Recent Labs    08/22/22 0318  HGBA1C 6.4*    Microbiology Recent Results (from the past 240 hour(s))  Calprotectin, Fecal     Status: Abnormal   Collection Time: 08/18/22  1:03 PM   Specimen: Stool  Result Value Ref Range Status   Calprotectin, Fecal >8,000 (H) 0 - 120 ug/g Final    Comment: (NOTE) **Results verified by repeat testing** Concentration     Interpretation   Follow-Up < 5 - 50 ug/g     Normal           None >50 -120 ug/g     Borderline       Re-evaluate in 4-6 weeks    >120 ug/g     Abnormal         Repeat as clinically                                   indicated Performed At: [facility] [address] [place], [place] [id number] Jolene Schimke MD XU:[id number]      Medications:    amLODipine  5 mg Oral Daily   insulin aspart  0-15 Units Subcutaneous TID WC   insulin aspart  0-5 Units Subcutaneous QHS   insulin aspart  3 Units Subcutaneous TID WC   levothyroxine  75 mcg Oral QAC breakfast   mesalamine  2.4 g Oral BID   methylPREDNISolone (SOLU-MEDROL) injection  40 mg Intravenous Daily   montelukast  10 mg Oral QHS   pantoprazole  40 mg Oral Daily   polyethylene glycol  17 g Oral Daily   venlafaxine XR  150 mg Oral BID   Continuous Infusions:  sodium chloride 10 mL/hr at  08/19/22 2101   piperacillin-tazobactam (ZOSYN)  IV 3.375 g (08/23/22 0058)      LOS: 6 days   [employer]  [facility]  08/23/2022, 9:55 AM

## 2022-08-23 NOTE — Plan of Care (Signed)
  Problem: Pain Managment: Goal: General experience of comfort will improve Outcome: Progressing   Problem: Metabolic: Goal: Ability to maintain appropriate glucose levels will improve Outcome: Progressing   Problem: Nutritional: Goal: Maintenance of adequate nutrition will improve Outcome: Progressing   

## 2022-08-23 NOTE — Telephone Encounter (Signed)
Biologic Rx Received: Today Esterwood, Amy S, PA-C  Iva Boop, MD; Emeline Darling, RN Baldo Ash, as you know this is your patient, currently finally improving on IV Zosyn and IV Solu-Medrol remains to be seen if she will need IR drainage .  Initiated the lab workup etc. anticipating she will require biologic therapy Would like your opinion regarding your preference  ?[place] Riches start the prior Auth process with her insurance to see what biologic they will cover for severe Crohn's colitis/probably fistulizing  And let us know what will be covered so we can start working on getting this lined up as an outpatient thanks

## 2022-08-24 DIAGNOSIS — K625 Hemorrhage of anus and rectum: Secondary | ICD-10-CM | POA: Diagnosis not present

## 2022-08-24 DIAGNOSIS — K50919 Crohn's disease, unspecified, with unspecified complications: Secondary | ICD-10-CM | POA: Diagnosis not present

## 2022-08-24 DIAGNOSIS — I10 Essential (primary) hypertension: Secondary | ICD-10-CM | POA: Diagnosis not present

## 2022-08-24 DIAGNOSIS — K5792 Diverticulitis of intestine, part unspecified, without perforation or abscess without bleeding: Secondary | ICD-10-CM | POA: Diagnosis not present

## 2022-08-24 DIAGNOSIS — R194 Change in bowel habit: Secondary | ICD-10-CM | POA: Diagnosis not present

## 2022-08-24 DIAGNOSIS — K5090 Crohn's disease, unspecified, without complications: Secondary | ICD-10-CM | POA: Diagnosis not present

## 2022-08-24 LAB — BASIC METABOLIC PANEL
Anion gap: 7 (ref 5–15)
BUN: 17 mg/dL (ref 8–23)
CO2: 29 mmol/L (ref 22–32)
Calcium: 8.4 mg/dL — ABNORMAL LOW (ref 8.9–10.3)
Chloride: 101 mmol/L (ref 98–111)
Creatinine, Ser: 0.84 mg/dL (ref 0.44–1.00)
GFR, Estimated: >60 mL/min (ref >60)
Glucose, Bld: 92 mg/dL (ref 70–99)
Potassium: 3.5 mmol/L (ref 3.5–5.1)
Sodium: 137 mmol/L (ref 135–145)

## 2022-08-24 LAB — GLUCOSE, CAPILLARY
Glucose-Capillary: 77 mg/dL (ref 70–99)
Glucose-Capillary: 141 mg/dL — ABNORMAL HIGH (ref 70–99)
Glucose-Capillary: 141 mg/dL — ABNORMAL HIGH (ref 70–99)
Glucose-Capillary: 95 mg/dL (ref 70–99)

## 2022-08-24 LAB — MAGNESIUM: Magnesium: 2.2 mg/dL (ref 1.7–2.4)

## 2022-08-24 MED ORDER — POTASSIUM CHLORIDE CRYS ER 20 MEQ PO TBCR
40.0000 meq | EXTENDED_RELEASE_TABLET | Freq: Once | ORAL | Status: AC
  Administered 2022-08-24: 40 meq via ORAL
  Filled 2022-08-24: qty 2

## 2022-08-24 MED ORDER — SODIUM CHLORIDE 0.9% FLUSH
10.0000 mL | Freq: Two times a day (BID) | INTRAVENOUS | Status: DC
  Administered 2022-08-26 – 2022-09-04 (×8): 10 mL

## 2022-08-24 MED ORDER — DICYCLOMINE HCL 20 MG PO TABS
20.0000 mg | ORAL_TABLET | Freq: Three times a day (TID) | ORAL | Status: DC | PRN
  Administered 2022-08-27 – 2022-08-28 (×2): 20 mg via ORAL
  Filled 2022-08-24 (×2): qty 1

## 2022-08-24 MED ORDER — SODIUM CHLORIDE 0.9% FLUSH: 10.0000 mL | INTRAVENOUS | Status: DC | PRN

## 2022-08-24 NOTE — Progress Notes (Addendum)
Progress Note  Primary GI: Dr. Leone Payor   Subjective  Chief Complaint:Abdominal pain, rule out acute diverticulitis with phlegmon versus exacerbation of Crohn's colitis with phlegmon   Patient with family at bedside, husband. Provided some of the history.  Patient states she has had increasing pain since yesterday morning, was doing well on IV Solu-Medrol but has had increasing right lower quadrant and left lower quadrant abdominal pain. She had 3 bowel movements last night, denies hematochezia, some hard small pieces. 1 bowel movement this morning loose stools. Denies nausea, vomiting, fevers, chills.    Objective   Vital signs in last 24 hours: Temp:  [97.9 F (36.6 C)-98.2 F (36.8 C)] 97.9 F (36.6 C) (04/11 0618) Pulse Rate:  [76-94] 79 (04/11 0618) Resp:  [18] 18 (04/11 0618) BP: (153-172)/(73-93) 160/81 (04/11 0618) SpO2:  [93 %-95 %] 93 % (04/11 0618) Last BM Date : 08/23/22 Last BM recorded by nurses in past 5 days Stool Type: Type 6 (Mushy consistency with ragged edges) (08/23/2022  7:24 PM)  General:   female in no acute distress  Heart:  Regular rate and rhythm; no murmurs Pulm: Clear anteriorly; no wheezing Abdomen:  Soft, Obese AB, Active bowel sounds. mild tenderness in the RLQ and in the LLQ. Without guarding and Without rebound, No organomegaly appreciated. Extremities:  without  edema. Neurologic:  Alert and  oriented x4;  No focal deficits.  Psych:  Cooperative. Normal mood and affect.  Intake/Output from previous day: 04/10 0701 - 04/11 0700 In: 652.3 [P.O.:545; IV Piggyback:107.3] Out: -  Intake/Output this shift: No intake/output data recorded.  Studies/Results: No results found.  Lab Results: Recent Labs    08/22/22 0320 08/23/22 0333  WBC 8.7 10.7*  HGB 10.2* 10.3*  HCT 32.4* 32.9*  PLT 262 278   BMET Recent Labs    08/23/22 0332 08/24/22 0324  NA 136 137  K 3.4* 3.5  CL 102 101  CO2 29 29  GLUCOSE 98 92  BUN 20 17   CREATININE 0.81 0.84  CALCIUM 8.2* 8.4*   LFT No results for input(s): "PROT", "ALBUMIN", "AST", "ALT", "ALKPHOS", "BILITOT", "BILIDIR", "IBILI" in the last 72 hours. PT/INR No results for input(s): "LABPROT", "INR" in the last 72 hours.   Scheduled Meds:  amLODipine  5 mg Oral Daily   insulin aspart  0-15 Units Subcutaneous TID WC   insulin aspart  0-5 Units Subcutaneous QHS   insulin aspart  3 Units Subcutaneous TID WC   levothyroxine  75 mcg Oral QAC breakfast   methylPREDNISolone (SOLU-MEDROL) injection  40 mg Intravenous Daily   montelukast  10 mg Oral QHS   pantoprazole  40 mg Oral Daily   venlafaxine XR  150 mg Oral BID   Continuous Infusions:  sodium chloride 10 mL/hr at 08/19/22 2101   piperacillin-tazobactam (ZOSYN)  IV 3.375 g (08/24/22 0535)      Patient profile:   67 year old white female with history of left-sided Crohn's colitis, who has been maintained on Lialda, self decreasing dose, admitted with 2 to 3 months of symptoms with change in bowel habits, loose stools alternating with constipation, rectal bleeding progressive lower abdominal pain..  Colonoscopy November 2020, also with no evidence of active colitis however random biopsies had shown mildly active chronic colitis with rare microgranuloma.   CT AB and pelvis on admission showed severe inflammatory process of the sigmoid and descending colon with surrounding inflammatory changes and probable phlegmon in the sigmoid mesocolon measuring 4.3 x 3.5 cm, also  with prominent stool in the colon    Impression/Plan:   Colitis associated phlegmon in the sigmoid mesocolon unclear whether this was related to Crohn's exacerbation versus diverticulitis Repeat CT scan 08/20/2022 persistent moderate wall thickening and soft tissue stranding mid sigmoid, also involving loop of distal ileum wall thickening soft tissues stranding questionable fistula and spiculated mixed attenuation density in the central pelvis 5 cm x 2.9 cm  suspicious for phlegmon versus early abscess.  08/21/2022  HepBsAG NON REACTIVE  08/15/2022 CRP 16.5  08/23/2022 WBC 10.7 HGB 10.3 MCV 88.7  08/18/2022 Fecal cal 8000+ IV Zosyn day 4(Cipro and Flagyl earlier in admission) IV Solu-Medrol day 4 ( started 04/08)  Hep B surface antibody negative- would benefit from vaccination Pending TB Gold, TPMT Continue IV Zosyn Continue IV Solu-Medrol  Patient's had some increasing abdominal pain, discussed dicyclomine, previously had some dizziness and headache, patient is willing to retry this, discontinue if any adverse effects. Plan for repeat CT Friday-order placed  with further recommendations on transition oral antibiotics and steroids as well as determining if she needs IR drainage We have initiated discussion with patient's insurance on authorization process for advancement of therapy with biologic  Normocytic anemia Stable hemoglobin 10.3  Morbid obesity  Body mass index is 46.37 kg/m.  -Patient has been advised to make an attempt to improve diet and exercise patterns to aid in weight loss. -Recommended diet heavy in fruits and veggies and low in animal meats, cheeses, and dairy products, appropriate calorie intake  Transaminitis CT AB normal liver, no ductal dilatation, s/p cholecystectomy  Down trending Negative acute hepatitis panel  Principal Problem:   Acute diverticulitis Active Problems:   Crohn's colitis (HCC) - left-sided   Obesity, Class III, BMI 40-49.9 (morbid obesity)   Acquired hypothyroidism   Asthma   Chronic anxiety   Essential hypertension   Hypercholesterolemia   Major depressive disorder   Abnormal alkaline phosphatase test   Transaminitis   Abnormal CT scan, colon   Abdominal pain, left lower quadrant   RLQ abdominal pain    LOS: 7 days   Doree Albeemanda R Collier  08/24/2022, 8:10 AM   Attending physician's note   I have taken history, reviewed the chart and examined the patient. I performed a substantive  portion of this encounter, including complete performance of at least one of the key components, in conjunction with the APP. I agree with the Advanced Practitioner's note, impression and recommendations.   CT AP in AM Continue IV Zosyn/IV Solu-Medrol Trend CBC, CMP, CRP Will try Bentyl for crampy abdominal pain. If increased diarrhea, may need to check C. Difficile. For Biologics- neg hep panel, TB Gold pending.  TPMT pending   Edman Circleaj Yazmine Sorey, MD [facility] [phone]

## 2022-08-24 NOTE — Progress Notes (Signed)
[facility] PROGRESS NOTE    Progress Note  Whitney BostonWendy Vawter  HYQ:[id number] DOB: 07/31/1955 DOA: 08/17/2022 PCP: Montez Hagemanurner, Woodbine C, DO     Brief Narrative:   Whitney Higgins is an 67 y.o. female past medical history significant for seasonal allergies anxiety asthma chronic back pain inflammatory bowel disease sent by the GI doctor to the emergency room for abdominal pain CT scan of the abdomen and pelvis showed inflammatory changes and probably phlegmon in the sigmoid mesocolon consistent with acute diverticulitis   Assessment/Plan:   Acute diverticulitis superimposed on Crohn's colitis: CT of the abdomen and pelvis showed no significant changes in pelvic inflammatory process of the sigmoid colon and distal ileum with an adjacent phlegmon. Patient was initially placed on Zosyn. Subsequently gastroenterology was consulted.  Steroids were initiated. Plan is to repeat CT scan tomorrow. Continue steroids and Zosyn. Patient did experience more abdominal pain yesterday than the day before. GI is considering initiating biological agents in the outpatient setting.  Hyperglycemia: Elevated glucose levels are secondary to steroids.  She has no history of diabetes.  HbA1c 6.4.    Normocytic anemia No evidence of overt blood loss.  Drop in hemoglobin is likely dilutional.  Continue to monitor.  Recheck labs tomorrow.  Hypokalemia Replete.  Asthma: Stable.  Chronic anxiety/major depression: Continue current meds.  Essential hypertension Continue amlodipine.  Occasional high readings noted which could be secondary to pain.    Hypothyroidism Continue levothyroxine.  hypercholesterolemia Continue statins.  Abnormal alkaline phosphatase test/  Transaminitis Resolved likely due to acute infectious etiology.  Obesity, Class III, BMI 40-49.9 (morbid obesity) Estimated body mass index is 46.37 kg/m as calculated from the following:   Height as of this encounter: 5\' 4"  (1.626 m).    Weight as of this encounter: 122.5 kg.   DVT prophylaxis: lovenox CODE STATUS: Full code Family Communication: Discussed with patient Disposition: Hopefully return home when improved.  Mobilize.  Status is: Inpatient Remains inpatient appropriate because: Acute diverticulitis    IV Access:   Peripheral IV   Procedures and diagnostic studies:   No results found.   Medical Consultants:   Gastroenterology  Subjective:   Had some abdominal pain in the last 24 hours.  No nausea or vomiting.  No chest pain or shortness of breath.  Objective:    Vitals:   08/23/22 1357 08/23/22 1406 08/23/22 2113 08/24/22 0618  BP: (!) 172/93 (!) 153/73 (!) 153/80 (!) 160/81  Pulse: 94  76 79  Resp: 18  18 18   Temp: 98.2 F (36.8 C)  98.1 F (36.7 C) 97.9 F (36.6 C)  TempSrc:   Oral Oral  SpO2: 95%  95% 93%  Weight:      Height:       SpO2: 93 %   Intake/Output Summary (Last 24 hours) at 08/24/2022 0912 Last data filed at 08/24/2022 [id number] Gross per 24 hour  Intake 652.27 ml  Output --  Net 652.27 ml    Filed Weights   08/17/22 1744  Weight: 122.5 kg    Exam:  General appearance: Awake alert.  In no distress Resp: Clear to auscultation bilaterally.  Normal effort Cardio: S1-S2 is normal regular.  No S3-S4.  No rubs murmurs or bruit GI: Abdomen is soft.  Tender in the lower abdomen right greater than left. Extremities: No edema.  Full range of motion of lower extremities. Neurologic: Alert and oriented x3.  No focal neurological deficits.     Data Reviewed:    Labs: Basic Metabolic  Panel: Recent Labs  Lab 08/17/22 1359 08/18/22 0255 08/19/22 1006 08/23/22 0332 08/24/22 0324  NA 133* 134* 133* 136 137  K 3.6 3.4* 3.7 3.4* 3.5  CL 94* 97* 98 102 101  CO2 27 28 29 29 29   GLUCOSE 107* 100* 128* 98 92  BUN 22 17 9 20 17   CREATININE 1.19* 0.92 0.89 0.81 0.84  CALCIUM 9.3 8.9 8.4* 8.2* 8.4*  MG  --   --   --   --  2.2    GFR Estimated Creatinine  Clearance: 82.8 mL/min (by C-G formula based on SCr of 0.84 mg/dL).  Liver Function Tests: Recent Labs  Lab 08/17/22 1359 08/18/22 0255  AST 45* 32  ALT 53* 44  ALKPHOS 133* 102  BILITOT 0.4 0.5  PROT 7.9 6.9  ALBUMIN 3.0* 2.6*    Recent Labs  Lab 08/17/22 1359  LIPASE 30     CBC: Recent Labs  Lab 08/19/22 0336 08/20/22 0322 08/21/22 0325 08/22/22 0320 08/23/22 0333  WBC 8.6 8.5 8.5 8.7 10.7*  NEUTROABS 6.5 6.4 6.4 7.7 8.9*  HGB 10.2* 10.2* 10.1* 10.2* 10.3*  HCT 32.6* 32.7* 32.4* 32.4* 32.9*  MCV 88.3 89.1 89.3 88.5 88.7  PLT 267 255 243 262 278     CBG: Recent Labs  Lab 08/23/22 0733 08/23/22 1155 08/23/22 1650 08/23/22 2152 08/24/22 0754  GLUCAP 80 108* 144* 126* 77    Hgb A1c: Recent Labs    08/22/22 0318  HGBA1C 6.4*     Microbiology Recent Results (from the past 240 hour(s))  Calprotectin, Fecal     Status: Abnormal   Collection Time: 08/18/22  1:03 PM   Specimen: Stool  Result Value Ref Range Status   Calprotectin, Fecal >8,000 (H) 0 - 120 ug/g Final    Comment: (NOTE) **Results verified by repeat testing** Concentration     Interpretation   Follow-Up < 5 - 50 ug/g     Normal           None >50 -120 ug/g     Borderline       Re-evaluate in 4-6 weeks    >120 ug/g     Abnormal         Repeat as clinically                                   indicated Performed At: [facility] [address] [place], [place] [id number] Jolene Schimke MD OV:[id number]      Medications:    amLODipine  5 mg Oral Daily   insulin aspart  0-15 Units Subcutaneous TID WC   insulin aspart  0-5 Units Subcutaneous QHS   insulin aspart  3 Units Subcutaneous TID WC   levothyroxine  75 mcg Oral QAC breakfast   methylPREDNISolone (SOLU-MEDROL) injection  40 mg Intravenous Daily   montelukast  10 mg Oral QHS   pantoprazole  40 mg Oral Daily   sodium chloride flush  10-40 mL Intracatheter Q12H   venlafaxine XR  150 mg Oral BID   Continuous  Infusions:  sodium chloride 10 mL/hr at 08/19/22 2101   piperacillin-tazobactam (ZOSYN)  IV 3.375 g (08/24/22 0852)      LOS: 7 days   [employer]  [facility]  08/24/2022, 9:12 AM

## 2022-08-25 ENCOUNTER — Inpatient Hospital Stay (HOSPITAL_COMMUNITY): Payer: Medicare Other

## 2022-08-25 DIAGNOSIS — K5090 Crohn's disease, unspecified, without complications: Secondary | ICD-10-CM | POA: Diagnosis not present

## 2022-08-25 DIAGNOSIS — R194 Change in bowel habit: Secondary | ICD-10-CM | POA: Diagnosis not present

## 2022-08-25 DIAGNOSIS — K50919 Crohn's disease, unspecified, with unspecified complications: Secondary | ICD-10-CM | POA: Diagnosis not present

## 2022-08-25 DIAGNOSIS — I10 Essential (primary) hypertension: Secondary | ICD-10-CM | POA: Diagnosis not present

## 2022-08-25 DIAGNOSIS — K625 Hemorrhage of anus and rectum: Secondary | ICD-10-CM | POA: Diagnosis not present

## 2022-08-25 DIAGNOSIS — K5792 Diverticulitis of intestine, part unspecified, without perforation or abscess without bleeding: Secondary | ICD-10-CM | POA: Diagnosis not present

## 2022-08-25 LAB — THIOPURINE METHYLTRANSFERASE (TPMT), RBC: TPMT Activity:: 21.9 Units/mL RBC

## 2022-08-25 LAB — QUANTIFERON-TB GOLD PLUS (RQFGPL)
QuantiFERON Mitogen Value: 0.01 IU/mL
QuantiFERON Nil Value: 0.03 IU/mL
QuantiFERON TB1 Ag Value: 0.01 IU/mL
QuantiFERON TB2 Ag Value: 0.01 IU/mL

## 2022-08-25 LAB — CBC
HCT: 37.3 % (ref 36.0–46.0)
Hemoglobin: 11.7 g/dL — ABNORMAL LOW (ref 12.0–15.0)
MCH: 27.9 pg (ref 26.0–34.0)
MCHC: 31.4 g/dL (ref 30.0–36.0)
MCV: 89 fL (ref 80.0–100.0)
Platelets: 294 10*3/uL (ref 150–400)
RBC: 4.19 MIL/uL (ref 3.87–5.11)
RDW: 14.8 % (ref 11.5–15.5)
WBC: 8.4 10*3/uL (ref 4.0–10.5)
nRBC: 0 % (ref 0.0–0.2)

## 2022-08-25 LAB — QUANTIFERON-TB GOLD PLUS: QuantiFERON-TB Gold Plus: UNDETERMINED — AB

## 2022-08-25 LAB — BASIC METABOLIC PANEL
Anion gap: 9 (ref 5–15)
BUN: 20 mg/dL (ref 8–23)
CO2: 31 mmol/L (ref 22–32)
Calcium: 8.5 mg/dL — ABNORMAL LOW (ref 8.9–10.3)
Chloride: 97 mmol/L — ABNORMAL LOW (ref 98–111)
Creatinine, Ser: 0.97 mg/dL (ref 0.44–1.00)
GFR, Estimated: >60 mL/min (ref >60)
Glucose, Bld: 92 mg/dL (ref 70–99)
Potassium: 3.9 mmol/L (ref 3.5–5.1)
Sodium: 137 mmol/L (ref 135–145)

## 2022-08-25 LAB — C-REACTIVE PROTEIN: CRP: 1.1 mg/dL — ABNORMAL HIGH (ref <1.0)

## 2022-08-25 LAB — GLUCOSE, CAPILLARY
Glucose-Capillary: 72 mg/dL (ref 70–99)
Glucose-Capillary: 87 mg/dL (ref 70–99)
Glucose-Capillary: 208 mg/dL — ABNORMAL HIGH (ref 70–99)
Glucose-Capillary: 121 mg/dL — ABNORMAL HIGH (ref 70–99)

## 2022-08-25 MED ORDER — SODIUM CHLORIDE (PF) 0.9 % IJ SOLN
INTRAMUSCULAR | Status: AC
  Filled 2022-08-25: qty 50

## 2022-08-25 MED ORDER — POLYETHYLENE GLYCOL 3350 17 G PO PACK
17.0000 g | PACK | Freq: Two times a day (BID) | ORAL | Status: DC
  Administered 2022-08-25 – 2022-08-28 (×7): 17 g via ORAL
  Filled 2022-08-25 (×7): qty 1

## 2022-08-25 MED ORDER — IOHEXOL 9 MG/ML PO SOLN
ORAL | Status: AC
  Filled 2022-08-25: qty 1000

## 2022-08-25 MED ORDER — IOHEXOL 300 MG/ML  SOLN
100.0000 mL | Freq: Once | INTRAMUSCULAR | Status: AC | PRN
  Administered 2022-08-25: 100 mL via INTRAVENOUS

## 2022-08-25 MED ORDER — IOHEXOL 9 MG/ML PO SOLN
500.0000 mL | ORAL | Status: AC
  Administered 2022-08-25 (×2): 500 mL via ORAL

## 2022-08-25 NOTE — Progress Notes (Signed)
Mobility Specialist - Progress Note   08/25/22 1440  Mobility  Activity Ambulated with assistance in hallway  Level of Assistance Modified independent, requires aide device or extra time  Assistive Device Other (Comment) (IV Pole)  Distance Ambulated (ft) 120 ft  Activity Response Tolerated well  Mobility Referral Yes  $Mobility charge 1 Mobility   Pt received in bed and agreeable to mobility. No complaints during session. Pt to bed after session with all needs met.     [facility]

## 2022-08-25 NOTE — Care Management Important Message (Signed)
Important Message  Patient Details IM Letter placed in Patient's rooom. Name: Whitney Higgins MRN: [id number] Date of Birth: 01/30/1956   Medicare Important Message Given:  Yes     Caren Macadam 08/25/2022, 12:43 PM

## 2022-08-25 NOTE — Progress Notes (Signed)
[facility] PROGRESS NOTE    Progress Note  Whitney Higgins  ZOX:[id number] DOB: 05-05-1956 DOA: 08/17/2022 PCP: Montez Hageman, DO     Brief Narrative:   Whitney Higgins is an 67 y.o. female past medical history significant for seasonal allergies anxiety asthma chronic back pain inflammatory bowel disease sent by the GI doctor to the emergency room for abdominal pain CT scan of the abdomen and pelvis showed inflammatory changes and probably phlegmon in the sigmoid mesocolon consistent with acute diverticulitis   Assessment/Plan:   Acute diverticulitis superimposed on Crohn's colitis: CT of the abdomen and pelvis showed no significant changes in pelvic inflammatory process of the sigmoid colon and distal ileum with an adjacent phlegmon. Patient was initially placed on Zosyn. Subsequently gastroenterology was consulted.  Steroids were initiated. Plan is to repeat CT scan of abdomen pelvis today. Continue with Zosyn and steroids for now. Seems to be stable for the most part.  Stool is solidifying. GI is considering initiating biological agents in the outpatient setting.  Hyperglycemia: Elevated glucose levels are secondary to steroids.  She has no history of diabetes.  HbA1c 6.4.    Normocytic anemia No evidence of overt blood loss.  Drop in hemoglobin is likely dilutional.  Stable.  Hypokalemia Repleted.  Asthma: Stable.  Chronic anxiety/major depression: Continue current meds.  Essential hypertension Continue amlodipine.  Occasional high readings noted which could be secondary to pain.    Hypothyroidism Continue levothyroxine.  hypercholesterolemia Continue statins.  Abnormal alkaline phosphatase test/Transaminitis Resolved likely due to acute infectious etiology.  Obesity, Class III Estimated body mass index is 46.37 kg/m as calculated from the following:   Height as of this encounter:  (1.626 m).   Weight as of this encounter: 122.5 kg.   DVT  prophylaxis: lovenox CODE STATUS: Full code Family Communication: Discussed with patient Disposition: Hopefully return home when improved.  Mobilize.  Status is: Inpatient Remains inpatient appropriate because: Acute diverticulitis    IV Access:   Peripheral IV   Procedures and diagnostic studies:   No results found.   Medical Consultants:   Gastroenterology  Subjective:   Occasional abdominal pain.  No nausea or vomiting.  No chest pain or shortness of breath.  Objective:    Vitals:   08/24/22 0618 08/24/22 1427 08/24/22 2227 08/25/22 0459  BP: (!) 160/81 (!) 141/83 (!) 157/85 (!) 158/90  Pulse: 79 88 77 72  Resp: Temp: 97.9 F (36.6 C) 98.2 F (36.8 C) 98.1 F (36.7 C) 98.1 F (36.7 C)  TempSrc: Oral Oral Oral Oral  SpO2: 93% 95% 90% 95%  Weight:      Height:       SpO2: 95 %   Intake/Output Summary (Last 24 hours) at 08/25/2022 0901 Last data filed at 08/25/2022 0550 Gross per 24 hour  Intake 724.49 ml  Output --  Net 724.49 ml    Filed Weights   08/17/22 1744  Weight: 122.5 kg    Exam:  General appearance: Awake alert.  In no distress Resp: Clear to auscultation bilaterally.  Normal effort Cardio: S1-S2 is normal regular.  No S3-S4.  No rubs murmurs or bruit GI: Abdomen is soft.  Nontender today.  Bowel sounds present. Extremities: No edema.  Full range of motion of lower extremities. Neurologic: Alert and oriented x3.  No focal neurological deficits.       Data Reviewed:    Labs: Basic Metabolic Panel: Recent Labs  Lab 08/19/22 1006 08/23/22 0332  08/24/22 0324 08/25/22 0341  NA 133* 136 137 137  K 3.7 3.4* 3.5 3.9  CL 98 102 101 97*  CO2 29 29 29 31   GLUCOSE 128* 98 92 92  BUN 9 20 17 20   CREATININE 0.89 0.81 0.84 0.97  CALCIUM 8.4* 8.2* 8.4* 8.5*  MG  --   --  2.2  --     GFR Estimated Creatinine Clearance: 71.7 mL/min (by C-G formula based on SCr of 0.97 mg/dL).   CBC: Recent Labs  Lab  08/19/22 0336 08/20/22 0322 08/21/22 0325 08/22/22 0320 08/23/22 0333 08/25/22 0341  WBC 8.6 8.5 8.5 8.7 10.7* 8.4  NEUTROABS 6.5 6.4 6.4 7.7 8.9*  --   HGB 10.2* 10.2* 10.1* 10.2* 10.3* 11.7*  HCT 32.6* 32.7* 32.4* 32.4* 32.9* 37.3  MCV 88.3 89.1 89.3 88.5 88.7 89.0  PLT 267 255 243 262 278 294     CBG: Recent Labs  Lab 08/24/22 0754 08/24/22 1148 08/24/22 1606 08/24/22 2217 08/25/22 0732  GLUCAP 77 141* 141* 95 72      Microbiology Recent Results (from the past 240 hour(s))  Calprotectin, Fecal     Status: Abnormal   Collection Time: 08/18/22  1:03 PM   Specimen: Stool  Result Value Ref Range Status   Calprotectin, Fecal >8,000 (H) 0 - 120 ug/g Final    Comment: (NOTE) **Results verified by repeat testing** Concentration     Interpretation   Follow-Up < 5 - 50 ug/g     Normal           None >50 -120 ug/g     Borderline       Re-evaluate in 4-6 weeks    >120 ug/g     Abnormal         Repeat as clinically                                   indicated Performed At: [facility] [address] [place], [place] [id number] Jolene Schimke MD MI:[id number]      Medications:    amLODipine  5 mg Oral Daily   insulin aspart  0-15 Units Subcutaneous TID WC   insulin aspart  0-5 Units Subcutaneous QHS   insulin aspart  3 Units Subcutaneous TID WC   iohexol  500 mL Oral Q1H   levothyroxine  75 mcg Oral QAC breakfast   methylPREDNISolone (SOLU-MEDROL) injection  40 mg Intravenous Daily   montelukast  10 mg Oral QHS   pantoprazole  40 mg Oral Daily   sodium chloride flush  10-40 mL Intracatheter Q12H   venlafaxine XR  150 mg Oral BID   Continuous Infusions:  sodium chloride 10 mL/hr at 08/19/22 2101   piperacillin-tazobactam (ZOSYN)  IV 3.375 g (08/25/22 0550)      LOS: 8 days   [employer]  [facility]  08/25/2022, 9:01 AM

## 2022-08-25 NOTE — Progress Notes (Addendum)
Progress Note  Primary GI: Dr. Leone Payor   Subjective  Chief Complaint:Abdominal pain, rule out acute diverticulitis with phlegmon versus exacerbation of Crohn's colitis with phlegmon   Patient with family at bedside, husband. Provided some of the history.  Patient never had dicyclomine yesterday, with Tylenol and 2 doses of morphine states pain improved and was able to sleep last night.   States she is feeling much better today with minimal abdominal pain. Patient had 1 formed stool last night and 1 formed stool this morning. Denies hematochezia or melena. Currently drinking oral contrast for CT abdomen pelvis scheduled for today Denies nausea, vomiting, fevers, chills.    Objective   Vital signs in last 24 hours: Temp:  [98.1 F (36.7 C)-98.2 F (36.8 C)] 98.1 F (36.7 C) (04/12 0459) Pulse Rate:  [72-88] 72 (04/12 0459) Resp:  [16-18] 16 (04/12 0459) BP: (141-158)/(83-90) 158/90 (04/12 0459) SpO2:  [90 %-95 %] 95 % (04/12 0459) Last BM Date : 08/24/22 Last BM recorded by nurses in past 5 days Stool Type: Type 6 (Mushy consistency with ragged edges) (08/24/2022  8:14 PM)  General:   female in no acute distress  Heart:  Regular rate and rhythm; no murmurs Pulm: Clear anteriorly; no wheezing Abdomen:  Soft, Obese AB, Active bowel sounds. mild tenderness in the RLQ. Without guarding and Without rebound, No organomegaly appreciated. Extremities:  without  edema. Neurologic:  Alert and  oriented x4;  No focal deficits.  Psych:  Cooperative. Normal mood and affect.  Intake/Output from previous day: 04/11 0701 - 04/12 0700 In: 964.5 [P.O.:720; I.V.:80; IV Piggyback:164.5] Out: -  Intake/Output this shift: No intake/output data recorded.  Studies/Results: No results found.  Lab Results: Recent Labs    08/23/22 0333 08/25/22 0341  WBC 10.7* 8.4  HGB 10.3* 11.7*  HCT 32.9* 37.3  PLT 278 294   BMET Recent Labs    08/23/22 0332 08/24/22 0324 08/25/22 0341  NA  136 137 137  K 3.4* 3.5 3.9  CL 102 101 97*  CO2 29 29 31   GLUCOSE 98 92 92  BUN 20 17 20   CREATININE 0.81 0.84 0.97  CALCIUM 8.2* 8.4* 8.5*   LFT No results for input(s): "PROT", "ALBUMIN", "AST", "ALT", "ALKPHOS", "BILITOT", "BILIDIR", "IBILI" in the last 72 hours. PT/INR No results for input(s): "LABPROT", "INR" in the last 72 hours.   Scheduled Meds:  amLODipine  5 mg Oral Daily   insulin aspart  0-15 Units Subcutaneous TID WC   insulin aspart  0-5 Units Subcutaneous QHS   insulin aspart  3 Units Subcutaneous TID WC   iohexol  500 mL Oral Q1H   levothyroxine  75 mcg Oral QAC breakfast   methylPREDNISolone (SOLU-MEDROL) injection  40 mg Intravenous Daily   montelukast  10 mg Oral QHS   pantoprazole  40 mg Oral Daily   sodium chloride flush  10-40 mL Intracatheter Q12H   venlafaxine XR  150 mg Oral BID   Continuous Infusions:  sodium chloride 10 mL/hr at 08/19/22 2101   piperacillin-tazobactam (ZOSYN)  IV 3.375 g (08/25/22 0550)      Patient profile:   67 year old white female with history of left-sided Crohn's colitis, who has been maintained on Lialda, self decreasing dose, admitted with 2 to 3 months of symptoms with change in bowel habits, loose stools alternating with constipation, rectal bleeding progressive lower abdominal pain..  Colonoscopy November 2020, also with no evidence of active colitis however random biopsies had shown mildly active chronic  colitis with rare microgranuloma.   CT AB and pelvis on admission showed severe inflammatory process of the sigmoid and descending colon with surrounding inflammatory changes and probable phlegmon in the sigmoid mesocolon measuring 4.3 x 3.5 cm, also with prominent stool in the colon    Impression/Plan:   Colitis associated phlegmon in the sigmoid mesocolon unclear whether this was related to Crohn's exacerbation versus diverticulitis Repeat CT scan 08/20/2022 persistent moderate wall thickening and soft tissue  stranding mid sigmoid, also involving loop of distal ileum wall thickening soft tissues stranding questionable fistula and spiculated mixed attenuation density in the central pelvis 5 cm x 2.9 cm suspicious for phlegmon versus early abscess.  For biologics,  HepBsAG NON REACTIVE  TB gold indeterminate, will repeat testing  Pending TPMT 08/25/2022 1.1 ( 16.5 on 04/02) 08/18/2022 Fecal cal 8000+, pending repeat IV Zosyn day 5(Cipro and Flagyl earlier in admission) IV Solu-Medrol day 5 ( started 04/08)   Addendum for results CT AP with contrast shows continuing moderate to severe wall thickening proximal sigmoid colon possible diverticulitis, although infectious/inflammatory colitis cannot be excluded.  Stable adjacent peritoneal fat most likely representing phlegmon less likely abscess.  Moderate wall thickening descending colon, small focal severe narrowing distal sigmoid colon with apple core appearance may present benign stricture but neoplasm cannot be excluded, increased amount of stool right and transverse colon suggesting inflammatory process involving descending and sigmoid colon resulting in possible partial obstruction  With discussed with the patient, husband was on the phone. Patient had colonoscopy 2020 with 2 polyps removed and had severe narrowing sigmoid colon associate with diverticular disease, and definitely represent worsening diverticular disease with associated narrowing and partial small bowel, possible still inflammatory/Crohn's related, less likely neoplasm.  Continue IV Zosyn Continue IV Solu-Medrol We have initiated discussion with patient's insurance on authorization process for advancement of therapy with biologic Will restart MiraLAX twice daily, switch to full liquid diet Will plan for flexible sigmoidoscopy Sunday or Monday pending on anesthesia availability over the weekend Will plan for enema 1-2 hours prior, NPO Sunday at 4 AM.  We have discussed the risks of  bleeding, infection, perforation (especially in setting of inflammation), medication reactions, and remote risk of death associated with Flex sigmoidoscopy. All questions were answered and the patient acknowledges these risk and wishes to proceed.  Normocytic anemia Stable hemoglobin, has increased, likely dilutional, no overt GI bleeding  Morbid obesity  Body mass index is 46.37 kg/m.  -Patient has been advised to make an attempt to improve diet and exercise patterns to aid in weight loss. -Recommended diet heavy in fruits and veggies and low in animal meats, cheeses, and dairy products, appropriate calorie intake  Transaminitis CT AB normal liver, no ductal dilatation, s/p cholecystectomy  Down trending Negative acute hepatitis panel  Principal Problem:   Acute diverticulitis Active Problems:   Crohn's colitis (HCC) - left-sided   Obesity, Class III, BMI 40-49.9 (morbid obesity)   Acquired hypothyroidism   Asthma   Chronic anxiety   Essential hypertension   Hypercholesterolemia   Major depressive disorder   Abnormal alkaline phosphatase test   Transaminitis   Abnormal CT scan, colon   Abdominal pain, left lower quadrant   RLQ abdominal pain    LOS: 8 days   Doree Albee  08/25/2022, 8:12 AM   Attending physician's note   I have reviewed the chart and discussed with Whitney Higgins. I performed a substantive portion of this encounter, including complete performance of at least one of the  key components, in conjunction with the APP. I agree with the Advanced Practitioner's note, impression and recommendations.   CT reviewed.   Mod- severe sigmoid wall thickening with partial obs-Crohn's colitis vs diverticulitis w/t stricture.  R/O masses.  Radiology recommends FS. No abscess. Prev neg colon 03/2019 except for div.  Clinically improving with Zosyn/Solu-Medrol. Will continue Add MiraLAX BID FS 4/14 after few days of miralax Will get Sx consult as outpt (or as inpt if no  improvement and depending upon FS results)   Edman Circle, MD [facility] [phone]

## 2022-08-25 NOTE — Plan of Care (Signed)
Plan of care reviewed and discussed with the patient. 

## 2022-08-26 DIAGNOSIS — I10 Essential (primary) hypertension: Secondary | ICD-10-CM | POA: Diagnosis not present

## 2022-08-26 DIAGNOSIS — K5090 Crohn's disease, unspecified, without complications: Secondary | ICD-10-CM | POA: Diagnosis not present

## 2022-08-26 DIAGNOSIS — R194 Change in bowel habit: Secondary | ICD-10-CM | POA: Diagnosis not present

## 2022-08-26 DIAGNOSIS — K5792 Diverticulitis of intestine, part unspecified, without perforation or abscess without bleeding: Secondary | ICD-10-CM | POA: Diagnosis not present

## 2022-08-26 DIAGNOSIS — K5732 Diverticulitis of large intestine without perforation or abscess without bleeding: Secondary | ICD-10-CM

## 2022-08-26 DIAGNOSIS — K50919 Crohn's disease, unspecified, with unspecified complications: Secondary | ICD-10-CM | POA: Diagnosis not present

## 2022-08-26 DIAGNOSIS — K625 Hemorrhage of anus and rectum: Secondary | ICD-10-CM | POA: Diagnosis not present

## 2022-08-26 LAB — GLUCOSE, CAPILLARY
Glucose-Capillary: 82 mg/dL (ref 70–99)
Glucose-Capillary: 118 mg/dL — ABNORMAL HIGH (ref 70–99)
Glucose-Capillary: 133 mg/dL — ABNORMAL HIGH (ref 70–99)
Glucose-Capillary: 110 mg/dL — ABNORMAL HIGH (ref 70–99)

## 2022-08-26 MED ORDER — LORATADINE 10 MG PO TABS
10.0000 mg | ORAL_TABLET | ORAL | Status: AC
  Administered 2022-08-26: 10 mg via ORAL
  Filled 2022-08-26: qty 1

## 2022-08-26 MED ORDER — ORAL CARE MOUTH RINSE: 15.0000 mL | OROMUCOSAL | Status: DC | PRN

## 2022-08-26 NOTE — Progress Notes (Signed)
[facility] PROGRESS NOTE    Progress Note  Whitney Higgins  GGY:[id number] DOB: 1956-01-23 DOA: 08/17/2022 PCP: Montez Hageman, DO     Brief Narrative:   Whitney Higgins is an 67 y.o. female past medical history significant for seasonal allergies anxiety asthma chronic back pain inflammatory bowel disease sent by the GI doctor to the emergency room for abdominal pain CT scan of the abdomen and pelvis showed inflammatory changes and probably phlegmon in the sigmoid mesocolon consistent with acute diverticulitis   Assessment/Plan:   Acute diverticulitis superimposed on Crohn's colitis: CT of the abdomen and pelvis showed no significant changes in pelvic inflammatory process of the sigmoid colon and distal ileum with an adjacent phlegmon. Patient was initially placed on Zosyn. Subsequently gastroenterology was consulted.  Steroids were initiated. CT was repeated on 4/12. Similar findings to previous CT suggesting diverticulitis or inflammatory or infectious colitis.  Stable findings of possible abscess.  Narrowing of the distal sigmoid colon also noted. GI is planning flexible sigmoidoscopy tomorrow. Continue with Zosyn and steroids for now. GI is considering initiating biological agents in the outpatient setting.  Hyperglycemia: Elevated glucose levels are secondary to steroids.  She has no history of diabetes.  HbA1c 6.4.    Normocytic anemia No evidence of overt blood loss.  Drop in hemoglobin is likely dilutional.  Stable.  Hypokalemia Repleted.  Asthma: Stable.  Chronic anxiety/major depression: Continue current meds.  Essential hypertension Continue amlodipine.  Occasional high readings noted which could be secondary to pain.    Hypothyroidism Continue levothyroxine.  Hypercholesterolemia Continue statins.  Abnormal alkaline phosphatase test/Transaminitis Resolved likely due to acute infectious etiology.  Obesity, Class III Estimated body mass index is  46.37 kg/m as calculated from the following:   Height as of this encounter: 5\' 4"  (1.626 m).   Weight as of this encounter: 122.5 kg.   DVT prophylaxis: lovenox CODE STATUS: Full code Family Communication: Discussed with patient Disposition: Hopefully return home when improved.  Mobilize.  Status is: Inpatient Remains inpatient appropriate because: Acute diverticulitis    IV Access:   Peripheral IV   Procedures and diagnostic studies:   CT ABDOMEN PELVIS W CONTRAST  Addendum Date: 08/25/2022   ADDENDUM REPORT: 08/25/2022 12:28 ADDENDUM: Increased amount of stool is noted in the right and transverse colon suggesting that the inflammatory process involving the descending and sigmoid colon is resulting in probable partial obstruction. Electronically Signed   By: Lupita Raider M.D.   On: 08/25/2022 12:28   Result Date: 08/25/2022 CLINICAL DATA:  Left lower quadrant abdominal pain. EXAM: CT ABDOMEN AND PELVIS WITH CONTRAST TECHNIQUE: Multidetector CT imaging of the abdomen and pelvis was performed using the standard protocol following bolus administration of intravenous contrast. RADIATION DOSE REDUCTION: This exam was performed according to the departmental dose-optimization program which includes automated exposure control, adjustment of the mA and/or kV according to patient size and/or use of iterative reconstruction technique. CONTRAST:  OMNIPAQUE IOHEXOL 300 MG/ML  SOLN COMPARISON:  August 20, 2022. FINDINGS: Lower chest: No acute abnormality. Hepatobiliary: No focal liver abnormality is seen. Status post cholecystectomy. No biliary dilatation. Pancreas: Unremarkable. No pancreatic ductal dilatation or surrounding inflammatory changes. Spleen: Normal in size without focal abnormality. Adrenals/Urinary Tract: Adrenal glands are unremarkable. Kidneys are normal, without renal calculi, focal lesion, or hydronephrosis. Bladder is unremarkable. Stomach/Bowel: Status post appendectomy. The  stomach is unremarkable. Small bowel dilatation is noted. There is again noted moderate to severe wall thickening involving the proximal sigmoid colon, as  well as moderate wall thickening of descending colon. This is concerning for diverticulitis or other infectious or inflammatory colitis. Stable finding is seen in the adjacent peritoneal fat consistent with associated phlegmon or possibly early abscess. There is also noted a severe narrowing involving the distal sigmoid colon with apple-core appearance best seen on image number 97 of series 5. Malignancy cannot be excluded and sigmoidoscopy is recommended for further evaluation. Vascular/Lymphatic: Aortic atherosclerosis. No enlarged abdominal or pelvic lymph nodes. Reproductive: Uterus and bilateral adnexa are unremarkable. Other: No abdominal wall hernia or abnormality. No abdominopelvic ascites. Musculoskeletal: Status post right total hip arthroplasty. No acute osseous abnormality is noted. IMPRESSION: There is again noted moderate to severe wall thickening involving the proximal sigmoid colon which may represent diverticulitis, although infectious or inflammatory colitis cannot be excluded. Stable findings seen in the adjacent peritoneal fat consistent with associated phlegmon or possibly early abscess. There is also now noted moderate wall thickening involving the descending colon also concerning for infectious or inflammatory colitis. Small focal severe narrowing is seen involving the distal sigmoid colon with apple-core appearance; this may represent benign stricture, but neoplasm cannot be excluded, and sigmoidoscopy is recommended. Aortic Atherosclerosis (ICD10-I70.0). Electronically Signed: By: Lupita Raider M.D. On: 08/25/2022 12:20     Medical Consultants:   Gastroenterology  Subjective:   Occasional right-sided abdominal pain.  No nausea or vomiting.  No shortness of breath.  Ambulating in the hallways.  Objective:    Vitals:    08/25/22 0459 08/25/22 1300 08/25/22 2105 08/26/22 0548  BP: (!) 158/90 (!) 145/87 (!) 149/86 (!) 153/82  Pulse: 72 88 85 74  Resp: Temp: 98.1 F (36.7 C) 98.1 F (36.7 C) 98.6 F (37 C) 98.1 F (36.7 C)  TempSrc: Oral Oral Oral Oral  SpO2: 95% 94% 93% 93%  Weight:      Height:       SpO2: 93 %   Intake/Output Summary (Last 24 hours) at 08/26/2022 0908 Last data filed at 08/26/2022 0852 Gross per 24 hour  Intake 565.53 ml  Output 2 ml  Net 563.53 ml    Filed Weights   08/17/22 1744  Weight: 122.5 kg    Exam:  General appearance: Awake alert.  In no distress Resp: Clear to auscultation bilaterally.  Normal effort Cardio: S1-S2 is normal regular.  No S3-S4.  No rubs murmurs or bruit GI: Abdomen is soft.  No significant tenderness appreciated this morning.  Bowel sounds present. Extremities: No edema.  Full range of motion of lower extremities. Neurologic: Alert and oriented x3.  No focal neurological deficits.      Data Reviewed:    Labs: Basic Metabolic Panel: Recent Labs  Lab 08/19/22 1006 08/23/22 0332 08/24/22 0324 08/25/22 0341  NA 133* 136 137 137  K 3.7 3.4* 3.5 3.9  CL 98 102 101 97*  CO2 GLUCOSE 128* 98 92 92  BUN CREATININE 0.89 0.81 0.84 0.97  CALCIUM 8.4* 8.2* 8.4* 8.5*  MG  --   --  2.2  --     GFR Estimated Creatinine Clearance: 71.7 mL/min (by C-G formula based on SCr of 0.97 mg/dL).   CBC: Recent Labs  Lab 08/20/22 0322 08/21/22 0325 08/22/22 0320 08/23/22 0333 08/25/22 0341  WBC 8.5 8.5 8.7 10.7* 8.4  NEUTROABS 6.4 6.4 7.7 8.9*  --   HGB 10.2* 10.1* 10.2* 10.3* 11.7*  HCT 32.7* 32.4* 32.4* 32.9* 37.3  MCV 89.1 89.3 88.5 88.7 89.0  PLT 255 243 262 278 294     CBG: Recent Labs  Lab 08/25/22 0732 08/25/22 1149 08/25/22 1547 08/25/22 2108 08/26/22 0715  GLUCAP 72 87 208* 121* 82      Microbiology Recent Results (from the past 240 hour(s))  Calprotectin, Fecal     Status:  Abnormal   Collection Time: 08/18/22  1:03 PM   Specimen: Stool  Result Value Ref Range Status   Calprotectin, Fecal >8,000 (H) 0 - 120 ug/g Final    Comment: (NOTE) **Results verified by repeat testing** Concentration     Interpretation   Follow-Up < 5 - 50 ug/g     Normal           None >50 -120 ug/g     Borderline       Re-evaluate in 4-6 weeks    >120 ug/g     Abnormal         Repeat as clinically                                   indicated Performed At: [facility] [address] [place], [place] [id number] Jolene Schimke MD [id number]      Medications:    amLODipine  5 mg Oral Daily   insulin aspart  0-15 Units Subcutaneous TID WC   insulin aspart  0-5 Units Subcutaneous QHS   insulin aspart  3 Units Subcutaneous TID WC   levothyroxine  75 mcg Oral QAC breakfast   methylPREDNISolone (SOLU-MEDROL) injection  40 mg Intravenous Daily   montelukast  10 mg Oral QHS   pantoprazole  40 mg Oral Daily   polyethylene glycol  17 g Oral BID   sodium chloride flush  10-40 mL Intracatheter Q12H   venlafaxine XR  150 mg Oral BID   Continuous Infusions:  sodium chloride 10 mL/hr at 08/26/22 0424   piperacillin-tazobactam (ZOSYN)  IV 3.375 g (08/26/22 0552)      LOS: 9 days   [employer]  [facility]  08/26/2022, 9:08 AM

## 2022-08-26 NOTE — Plan of Care (Signed)
  Problem: Pain Managment: Goal: General experience of comfort will improve Outcome: Progressing   Problem: Coping: Goal: Ability to adjust to condition or change in health will improve Outcome: Progressing

## 2022-08-26 NOTE — H&P (View-Only) (Signed)
Progress Note    ASSESSMENT AND PLAN:   Left-sided colitis with sigmoid stricture-sigmoid diverticulitis with phlegmon vs Crohn's. R/O neoplastic. No abscess to drain (hence IR not consulted)  Plan: -Continue Zosyn -Continue Solu-Medrol (day 6) -Flex sig in AM -Likely Sx consult Monday or as out pt.     SUBJECTIVE   Having bowel movements-mushy Abdominal pain has improved but still there No fever or chills Tolerating p.o. well      OBJECTIVE:     Vital signs in last 24 hours: Temp:  [98.1 F (36.7 C)-98.6 F (37 C)] 98.1 F (36.7 C) (04/13 0548) Pulse Rate:  [74-88] 74 (04/13 0548) Resp:  [16-18] 16 (04/13 0548) BP: (145-153)/(82-87) 153/82 (04/13 0548) SpO2:  [93 %-94 %] 93 % (04/13 0548) Last BM Date : 08/25/22 General:   Alert, well-developed female in NAD EENT:  Normal hearing, non icteric sclera, conjunctive pink.  Abdomen:  Soft, nondistended, mild right-sided abdominal tenderness.  Normal bowel sounds,.       Neurologic:  Alert and  oriented x4;  grossly normal neurologically. Psych:  Pleasant, cooperative.  Normal mood and affect.   Intake/Output from previous day: 04/12 0701 - 04/13 0700 In: 565.5 [P.O.:240; I.V.:225.5; IV Piggyback:100] Out: 2 [Urine:1; Stool:1] Intake/Output this shift: Total I/O In: 240 [P.O.:240] Out: -   Lab Results: Recent Labs    08/25/22 0341  WBC 8.4  HGB 11.7*  HCT 37.3  PLT 294   BMET Recent Labs    08/24/22 0324 08/25/22 0341  NA 137 137  K 3.5 3.9  CL 101 97*  CO2 29 31  GLUCOSE 92 92  BUN 17 20  CREATININE 0.84 0.97  CALCIUM 8.4* 8.5*     CT ABDOMEN PELVIS W CONTRAST  Addendum Date: 08/25/2022   ADDENDUM REPORT: 08/25/2022 12:28 ADDENDUM: Increased amount of stool is noted in the right and transverse colon suggesting that the inflammatory process involving the descending and sigmoid colon is resulting in probable partial obstruction. Electronically Signed   By: Lupita Raider M.D.   On:  08/25/2022 12:28   Result Date: 08/25/2022 CLINICAL DATA:  Left lower quadrant abdominal pain. EXAM: CT ABDOMEN AND PELVIS WITH CONTRAST TECHNIQUE: Multidetector CT imaging of the abdomen and pelvis was performed using the standard protocol following bolus administration of intravenous contrast. RADIATION DOSE REDUCTION: This exam was performed according to the departmental dose-optimization program which includes automated exposure control, adjustment of the mA and/or kV according to patient size and/or use of iterative reconstruction technique. CONTRAST:  OMNIPAQUE IOHEXOL 300 MG/ML  SOLN COMPARISON:  August 20, 2022. FINDINGS: Lower chest: No acute abnormality. Hepatobiliary: No focal liver abnormality is seen. Status post cholecystectomy. No biliary dilatation. Pancreas: Unremarkable. No pancreatic ductal dilatation or surrounding inflammatory changes. Spleen: Normal in size without focal abnormality. Adrenals/Urinary Tract: Adrenal glands are unremarkable. Kidneys are normal, without renal calculi, focal lesion, or hydronephrosis. Bladder is unremarkable. Stomach/Bowel: Status post appendectomy. The stomach is unremarkable. Small bowel dilatation is noted. There is again noted moderate to severe wall thickening involving the proximal sigmoid colon, as well as moderate wall thickening of descending colon. This is concerning for diverticulitis or other infectious or inflammatory colitis. Stable finding is seen in the adjacent peritoneal fat consistent with associated phlegmon or possibly early abscess. There is also noted a severe narrowing involving the distal sigmoid colon with apple-core appearance best seen on image number 97 of series 5. Malignancy cannot be excluded and sigmoidoscopy is recommended for  further evaluation. Vascular/Lymphatic: Aortic atherosclerosis. No enlarged abdominal or pelvic lymph nodes. Reproductive: Uterus and bilateral adnexa are unremarkable. Other: No abdominal wall hernia  or abnormality. No abdominopelvic ascites. Musculoskeletal: Status post right total hip arthroplasty. No acute osseous abnormality is noted. IMPRESSION: There is again noted moderate to severe wall thickening involving the proximal sigmoid colon which may represent diverticulitis, although infectious or inflammatory colitis cannot be excluded. Stable findings seen in the adjacent peritoneal fat consistent with associated phlegmon or possibly early abscess. There is also now noted moderate wall thickening involving the descending colon also concerning for infectious or inflammatory colitis. Small focal severe narrowing is seen involving the distal sigmoid colon with apple-core appearance; this may represent benign stricture, but neoplasm cannot be excluded, and sigmoidoscopy is recommended. Aortic Atherosclerosis (ICD10-I70.0). Electronically Signed: By: Lupita Raider M.D. On: 08/25/2022 12:20     Principal Problem:   Acute diverticulitis Active Problems:   Crohn's colitis (HCC) - left-sided   Obesity, Class III, BMI 40-49.9 (morbid obesity)   Acquired hypothyroidism   Asthma   Chronic anxiety   Essential hypertension   Hypercholesterolemia   Major depressive disorder   Abnormal alkaline phosphatase test   Transaminitis   Abnormal CT scan, colon   Abdominal pain, left lower quadrant   RLQ abdominal pain     LOS: 9 days     Edman Circle, MD 08/26/2022, 9:51 AM [facility] [phone]

## 2022-08-26 NOTE — Plan of Care (Signed)
  Problem: Clinical Measurements: Goal: Ability to maintain clinical measurements within normal limits will improve Outcome: Progressing Goal: Will remain free from infection Outcome: Progressing Goal: Diagnostic test results will improve Outcome: Progressing Goal: Respiratory complications will improve Outcome: Progressing   Problem: Activity: Goal: Risk for activity intolerance will decrease Outcome: Progressing   Problem: Nutrition: Goal: Adequate nutrition will be maintained Outcome: Progressing   Problem: Pain Managment: Goal: General experience of comfort will improve Outcome: Progressing   Problem: Education: Goal: Ability to describe self-care measures that may prevent or decrease complications (Diabetes Survival Skills Education) will improve Outcome: Progressing Goal: Individualized Educational Video(s) Outcome: Progressing   Problem: Coping: Goal: Ability to adjust to condition or change in health will improve Outcome: Progressing   Problem: Fluid Volume: Goal: Ability to maintain a balanced intake and output will improve Outcome: Progressing   Problem: Health Behavior/Discharge Planning: Goal: Ability to identify and utilize available resources and services will improve Outcome: Progressing Goal: Ability to manage health-related needs will improve Outcome: Progressing   Problem: Metabolic: Goal: Ability to maintain appropriate glucose levels will improve Outcome: Progressing   Problem: Nutritional: Goal: Maintenance of adequate nutrition will improve Outcome: Progressing Goal: Progress toward achieving an optimal weight will improve Outcome: Progressing   Problem: Skin Integrity: Goal: Risk for impaired skin integrity will decrease Outcome: Progressing   Problem: Tissue Perfusion: Goal: Adequacy of tissue perfusion will improve Outcome: Progressing

## 2022-08-26 NOTE — Progress Notes (Signed)
Progress Note    ASSESSMENT AND PLAN:   Left-sided colitis with sigmoid stricture-sigmoid diverticulitis with phlegmon vs Crohn's. R/O neoplastic. No abscess to drain (hence IR not consulted)  Plan: -Continue Zosyn -Continue Solu-Medrol (day 6) -Flex sig in AM -Likely Sx consult Monday or as out pt.     SUBJECTIVE   Having bowel movements-mushy Abdominal pain has improved but still there No fever or chills Tolerating p.o. well      OBJECTIVE:     Vital signs in last 24 hours: Temp:  [98.1 F (36.7 C)-98.6 F (37 C)] 98.1 F (36.7 C) (04/13 0548) Pulse Rate:  [74-88] 74 (04/13 0548) Resp:  [16-18] 16 (04/13 0548) BP: (145-153)/(82-87) 153/82 (04/13 0548) SpO2:  [93 %-94 %] 93 % (04/13 0548) Last BM Date : 08/25/22 General:   Alert, well-developed female in NAD EENT:  Normal hearing, non icteric sclera, conjunctive pink.  Abdomen:  Soft, nondistended, mild right-sided abdominal tenderness.  Normal bowel sounds,.       Neurologic:  Alert and  oriented x4;  grossly normal neurologically. Psych:  Pleasant, cooperative.  Normal mood and affect.   Intake/Output from previous day: 04/12 0701 - 04/13 0700 In: 565.5 [P.O.:240; I.V.:225.5; IV Piggyback:100] Out: 2 [Urine:1; Stool:1] Intake/Output this shift: Total I/O In: 240 [P.O.:240] Out: -   Lab Results: Recent Labs    08/25/22 0341  WBC 8.4  HGB 11.7*  HCT 37.3  PLT 294   BMET Recent Labs    08/24/22 0324 08/25/22 0341  NA 137 137  K 3.5 3.9  CL 101 97*  CO2 29 31  GLUCOSE 92 92  BUN 17 20  CREATININE 0.84 0.97  CALCIUM 8.4* 8.5*     CT ABDOMEN PELVIS W CONTRAST  Addendum Date: 08/25/2022   ADDENDUM REPORT: 08/25/2022 12:28 ADDENDUM: Increased amount of stool is noted in the right and transverse colon suggesting that the inflammatory process involving the descending and sigmoid colon is resulting in probable partial obstruction. Electronically Signed   By: Lupita Raider M.D.   On:  08/25/2022 12:28   Result Date: 08/25/2022 CLINICAL DATA:  Left lower quadrant abdominal pain. EXAM: CT ABDOMEN AND PELVIS WITH CONTRAST TECHNIQUE: Multidetector CT imaging of the abdomen and pelvis was performed using the standard protocol following bolus administration of intravenous contrast. RADIATION DOSE REDUCTION: This exam was performed according to the departmental dose-optimization program which includes automated exposure control, adjustment of the mA and/or kV according to patient size and/or use of iterative reconstruction technique. CONTRAST:  OMNIPAQUE IOHEXOL 300 MG/ML  SOLN COMPARISON:  August 20, 2022. FINDINGS: Lower chest: No acute abnormality. Hepatobiliary: No focal liver abnormality is seen. Status post cholecystectomy. No biliary dilatation. Pancreas: Unremarkable. No pancreatic ductal dilatation or surrounding inflammatory changes. Spleen: Normal in size without focal abnormality. Adrenals/Urinary Tract: Adrenal glands are unremarkable. Kidneys are normal, without renal calculi, focal lesion, or hydronephrosis. Bladder is unremarkable. Stomach/Bowel: Status post appendectomy. The stomach is unremarkable. Small bowel dilatation is noted. There is again noted moderate to severe wall thickening involving the proximal sigmoid colon, as well as moderate wall thickening of descending colon. This is concerning for diverticulitis or other infectious or inflammatory colitis. Stable finding is seen in the adjacent peritoneal fat consistent with associated phlegmon or possibly early abscess. There is also noted a severe narrowing involving the distal sigmoid colon with apple-core appearance best seen on image number 97 of series 5. Malignancy cannot be excluded and sigmoidoscopy is recommended for  further evaluation. Vascular/Lymphatic: Aortic atherosclerosis. No enlarged abdominal or pelvic lymph nodes. Reproductive: Uterus and bilateral adnexa are unremarkable. Other: No abdominal wall hernia  or abnormality. No abdominopelvic ascites. Musculoskeletal: Status post right total hip arthroplasty. No acute osseous abnormality is noted. IMPRESSION: There is again noted moderate to severe wall thickening involving the proximal sigmoid colon which may represent diverticulitis, although infectious or inflammatory colitis cannot be excluded. Stable findings seen in the adjacent peritoneal fat consistent with associated phlegmon or possibly early abscess. There is also now noted moderate wall thickening involving the descending colon also concerning for infectious or inflammatory colitis. Small focal severe narrowing is seen involving the distal sigmoid colon with apple-core appearance; this may represent benign stricture, but neoplasm cannot be excluded, and sigmoidoscopy is recommended. Aortic Atherosclerosis (ICD10-I70.0). Electronically Signed: By: James  Green Jr M.D. On: 08/25/2022 12:20     Principal Problem:   Acute diverticulitis Active Problems:   Crohn's colitis (HCC) - left-sided   Obesity, Class III, BMI 40-49.9 (morbid obesity)   Acquired hypothyroidism   Asthma   Chronic anxiety   Essential hypertension   Hypercholesterolemia   Major depressive disorder   Abnormal alkaline phosphatase test   Transaminitis   Abnormal CT scan, colon   Abdominal pain, left lower quadrant   RLQ abdominal pain     LOS: 9 days     Raj Elton Heid, MD 08/26/2022, 9:51 AM [facility] [phone]       

## 2022-08-26 NOTE — Progress Notes (Signed)
Mobility Specialist - Progress Note   08/26/22 1428  Mobility  Activity Ambulated with assistance in hallway  Level of Assistance Modified independent, requires aide device or extra time  Assistive Device Other (Comment) (IV Pole)  Distance Ambulated (ft) 160 ft  Activity Response Tolerated well  Mobility Referral Yes  $Mobility charge 1 Mobility   Pt received in bed and agreeable to mobility. No complaints during session. Pt to bed after session with all needs met.    [facility]

## 2022-08-27 ENCOUNTER — Encounter (HOSPITAL_COMMUNITY)
Admission: EM | Disposition: A | Discharge status: Home / Self Care | Payer: Self-pay | Source: Home / Self Care | Attending: Internal Medicine

## 2022-08-27 ENCOUNTER — Encounter (HOSPITAL_COMMUNITY): Payer: Self-pay | Admitting: Anesthesiology

## 2022-08-27 ENCOUNTER — Inpatient Hospital Stay (HOSPITAL_COMMUNITY): Payer: Medicare Other | Admitting: Certified Registered Nurse Anesthetist

## 2022-08-27 DIAGNOSIS — R933 Abnormal findings on diagnostic imaging of other parts of digestive tract: Secondary | ICD-10-CM | POA: Diagnosis not present

## 2022-08-27 DIAGNOSIS — K50919 Crohn's disease, unspecified, with unspecified complications: Secondary | ICD-10-CM | POA: Diagnosis not present

## 2022-08-27 DIAGNOSIS — K5792 Diverticulitis of intestine, part unspecified, without perforation or abscess without bleeding: Secondary | ICD-10-CM | POA: Diagnosis not present

## 2022-08-27 DIAGNOSIS — I2510 Atherosclerotic heart disease of native coronary artery without angina pectoris: Secondary | ICD-10-CM

## 2022-08-27 DIAGNOSIS — J45909 Unspecified asthma, uncomplicated: Secondary | ICD-10-CM

## 2022-08-27 DIAGNOSIS — I10 Essential (primary) hypertension: Secondary | ICD-10-CM

## 2022-08-27 HISTORY — PX: FLEXIBLE SIGMOIDOSCOPY: SHX5431

## 2022-08-27 LAB — BASIC METABOLIC PANEL
Anion gap: 10 (ref 5–15)
BUN: 17 mg/dL (ref 8–23)
CO2: 29 mmol/L (ref 22–32)
Calcium: 8.8 mg/dL — ABNORMAL LOW (ref 8.9–10.3)
Chloride: 98 mmol/L (ref 98–111)
Creatinine, Ser: 0.94 mg/dL (ref 0.44–1.00)
GFR, Estimated: >60 mL/min (ref >60)
Glucose, Bld: 83 mg/dL (ref 70–99)
Potassium: 4.5 mmol/L (ref 3.5–5.1)
Sodium: 137 mmol/L (ref 135–145)

## 2022-08-27 LAB — CBC
HCT: 37.9 % (ref 36.0–46.0)
Hemoglobin: 11.9 g/dL — ABNORMAL LOW (ref 12.0–15.0)
MCH: 27.7 pg (ref 26.0–34.0)
MCHC: 31.4 g/dL (ref 30.0–36.0)
MCV: 88.1 fL (ref 80.0–100.0)
Platelets: 267 10*3/uL (ref 150–400)
RBC: 4.3 MIL/uL (ref 3.87–5.11)
RDW: 15.5 % (ref 11.5–15.5)
WBC: 9.1 10*3/uL (ref 4.0–10.5)
nRBC: 0 % (ref 0.0–0.2)

## 2022-08-27 LAB — GLUCOSE, CAPILLARY
Glucose-Capillary: 76 mg/dL (ref 70–99)
Glucose-Capillary: 94 mg/dL (ref 70–99)
Glucose-Capillary: 175 mg/dL — ABNORMAL HIGH (ref 70–99)
Glucose-Capillary: 123 mg/dL — ABNORMAL HIGH (ref 70–99)

## 2022-08-27 SURGERY — SIGMOIDOSCOPY, FLEXIBLE
Anesthesia: Monitor Anesthesia Care

## 2022-08-27 MED ORDER — PROPOFOL 500 MG/50ML IV EMUL
INTRAVENOUS | Status: DC | PRN
  Administered 2022-08-27: 125 ug/kg/min via INTRAVENOUS

## 2022-08-27 MED ORDER — SODIUM CHLORIDE 0.9 % IV SOLN: INTRAVENOUS | Status: DC

## 2022-08-27 MED ORDER — LACTATED RINGERS IV SOLN
INTRAVENOUS | Status: AC | PRN
  Administered 2022-08-27: 20 mL/h via INTRAVENOUS

## 2022-08-27 MED ORDER — PROPOFOL 10 MG/ML IV BOLUS
INTRAVENOUS | Status: DC | PRN
  Administered 2022-08-27: 20 mg via INTRAVENOUS

## 2022-08-27 MED ORDER — LIDOCAINE 2% (20 MG/ML) 5 ML SYRINGE
INTRAMUSCULAR | Status: DC | PRN
  Administered 2022-08-27: 60 mg via INTRAVENOUS

## 2022-08-27 NOTE — Op Note (Addendum)
[facility] Patient Name: Whitney Higgins Procedure Date: 08/27/2022 MRN: [id number] Attending MD: Lynann Bologna , MD, [id number] Date of Birth: Sep 17, 1955 CSN: [id number] Age: 67 Admit Type: Inpatient Procedure:                Flexible Sigmoidoscopy Indications:              Abnormal CT of the GI tract showing sigmoid                            stricture. FS is being performed to rule out any                            obstruction. Providers:                Lynann Bologna, MD, Vicki Mallet, RN, Joannie Springs, Technician Referring MD:              Medicines:                Monitored Anesthesia Care Complications:            No immediate complications. Estimated Blood Loss:     Estimated blood loss: none. Procedure:                Pre-Anesthesia Assessment:                           - Prior to the procedure, a History and Physical                            was performed, and patient medications and                            allergies were reviewed. The patient's tolerance of                            previous anesthesia was also reviewed. The risks                            and benefits of the procedure and the sedation                            options and risks were discussed with the patient.                            All questions were answered, and informed consent                            was obtained. Prior Anticoagulants: The patient has                            taken no anticoagulant or antiplatelet agents. ASA  Grade Assessment: III - A patient with severe                            systemic disease. After reviewing the risks and                            benefits, the patient was deemed in satisfactory                            condition to undergo the procedure.                           After obtaining informed consent, the scope was                            passed under direct vision. The  [id number]-[id number] ([id number])                            Olympus endoscope was introduced through the anus                            The procedure was aborted. The scope was not                            inserted. splenic flexure Medications were                            rectosigmoid junction. The flexible sigmoidoscopy                            was accomplished without difficulty. The patient                            tolerated the procedure well. The quality of the                            bowel preparation was poor. The flexible                            sigmoidoscopy was aborted due to poor endoscopic                            visualization. Scope In: Scope Out: Findings:      A moderate amount of stool was found in the rectum and in the       recto-sigmoid colon, precluding visualization. Impression:               - Preparation of the colon was poor.                           - The procedure was aborted due to poor endoscopic                            visualization.                           -  Stool in the rectum and in the recto-sigmoid                            colon.                           - No specimens collected. Moderate Sedation:      Not Applicable - Patient had care per Anesthesia. Recommendation:           - Return patient to hospital ward for ongoing care.                           - Continue present medications.                           - Transition to PO pred in AM (  x 1 week, 30 x                            1 week, then  po QD x 2 weeks, then 10QD x 2                            weeks)                           - Consider transition to p.o. antibiotics ( 7 more                            days)                           - X ray KUB in AM to assess stool burden                           - FU GI as outpt in 2-3 weeks. If she requires                            further endoscopic procedures, would recommend full                            colonoscopy after  2-day prep (to be determined as                            outpt). Consider bioplogics as outpt (neg TB gold,                            HBsAg, also neg TPMT). CRP back to normal.                           - The findings and recommendations were discussed                            with the patient's family. Procedure Code(s):        --- Professional ---  45330, 52, Sigmoidoscopy, flexible; diagnostic,                            including collection of specimen(s) by brushing or                            washing, when performed (separate procedure) Diagnosis Code(s):        --- Professional ---                           R93.3, Abnormal findings on diagnostic imaging of                            other parts of digestive tract CPT copyright 2022 American Medical Association. All rights reserved. The codes documented in this report are preliminary and upon coder review may  be revised to meet current compliance requirements. Lynann Bologna, MD 08/27/2022 9:46:07 AM This report has been signed electronically. Number of Addenda: 0

## 2022-08-27 NOTE — Progress Notes (Signed)
[facility] PROGRESS NOTE    Progress Note  Whitney Higgins  ION:[id number] DOB: 02-27-1956 DOA: 08/17/2022 PCP: Montez Hageman, DO     Brief Narrative:   Whitney Higgins is an 67 y.o. female past medical history significant for seasonal allergies anxiety asthma chronic back pain inflammatory bowel disease sent by the GI doctor to the emergency room for abdominal pain CT scan of the abdomen and pelvis showed inflammatory changes and probably phlegmon in the sigmoid mesocolon consistent with acute diverticulitis   Assessment/Plan:   Acute diverticulitis superimposed on Crohn's colitis: CT of the abdomen and pelvis showed no significant changes in pelvic inflammatory process of the sigmoid colon and distal ileum with an adjacent phlegmon. Patient was initially placed on Zosyn. Subsequently gastroenterology was consulted.  Steroids were initiated. CT was repeated on 4/12. Similar findings to previous CT suggesting diverticulitis or inflammatory or infectious colitis.  Stable findings of possible abscess.  Narrowing of the distal sigmoid colon also noted. Plan is for flexible sigmoidoscopy today. Continue with Zosyn and steroids for now.  WBC is normal. GI is considering initiating biological agents in the outpatient setting.  Hyperglycemia: Elevated glucose levels are secondary to steroids.  She has no history of diabetes.  HbA1c 6.4.    Normocytic anemia No evidence of overt blood loss.  Drop in hemoglobin is likely dilutional.  Stable.  Hypokalemia Repleted.  Asthma: Stable.  Chronic anxiety/major depression: Continue current meds.  Essential hypertension Continue amlodipine.  Occasional high readings noted which could be secondary to pain.    Hypothyroidism Continue levothyroxine.  Hypercholesterolemia Continue statins.  Abnormal alkaline phosphatase test/Transaminitis Resolved likely due to acute infectious etiology.  Obesity, Class III Estimated body mass  index is 46.37 kg/m as calculated from the following:   Height as of this encounter:  (1.626 m).   Weight as of this encounter: 122.5 kg.   DVT prophylaxis: lovenox CODE STATUS: Full code Family Communication: Discussed with patient Disposition: Hopefully return home when improved.  Continue to mobilize.  Status is: Inpatient Remains inpatient appropriate because: Acute diverticulitis    IV Access:   Peripheral IV   Procedures and diagnostic studies:   CT ABDOMEN PELVIS W CONTRAST  Addendum Date: 08/25/2022   ADDENDUM REPORT: 08/25/2022 12:28 ADDENDUM: Increased amount of stool is noted in the right and transverse colon suggesting that the inflammatory process involving the descending and sigmoid colon is resulting in probable partial obstruction. Electronically Signed   By: Lupita Raider M.D.   On: 08/25/2022 12:28   Result Date: 08/25/2022 CLINICAL DATA:  Left lower quadrant abdominal pain. EXAM: CT ABDOMEN AND PELVIS WITH CONTRAST TECHNIQUE: Multidetector CT imaging of the abdomen and pelvis was performed using the standard protocol following bolus administration of intravenous contrast. RADIATION DOSE REDUCTION: This exam was performed according to the departmental dose-optimization program which includes automated exposure control, adjustment of the mA and/or kV according to patient size and/or use of iterative reconstruction technique. CONTRAST:  OMNIPAQUE IOHEXOL 300 MG/ML  SOLN COMPARISON:  August 20, 2022. FINDINGS: Lower chest: No acute abnormality. Hepatobiliary: No focal liver abnormality is seen. Status post cholecystectomy. No biliary dilatation. Pancreas: Unremarkable. No pancreatic ductal dilatation or surrounding inflammatory changes. Spleen: Normal in size without focal abnormality. Adrenals/Urinary Tract: Adrenal glands are unremarkable. Kidneys are normal, without renal calculi, focal lesion, or hydronephrosis. Bladder is unremarkable. Stomach/Bowel: Status  post appendectomy. The stomach is unremarkable. Small bowel dilatation is noted. There is again noted moderate to severe wall thickening  involving the proximal sigmoid colon, as well as moderate wall thickening of descending colon. This is concerning for diverticulitis or other infectious or inflammatory colitis. Stable finding is seen in the adjacent peritoneal fat consistent with associated phlegmon or possibly early abscess. There is also noted a severe narrowing involving the distal sigmoid colon with apple-core appearance best seen on image number 97 of series 5. Malignancy cannot be excluded and sigmoidoscopy is recommended for further evaluation. Vascular/Lymphatic: Aortic atherosclerosis. No enlarged abdominal or pelvic lymph nodes. Reproductive: Uterus and bilateral adnexa are unremarkable. Other: No abdominal wall hernia or abnormality. No abdominopelvic ascites. Musculoskeletal: Status post right total hip arthroplasty. No acute osseous abnormality is noted. IMPRESSION: There is again noted moderate to severe wall thickening involving the proximal sigmoid colon which may represent diverticulitis, although infectious or inflammatory colitis cannot be excluded. Stable findings seen in the adjacent peritoneal fat consistent with associated phlegmon or possibly early abscess. There is also now noted moderate wall thickening involving the descending colon also concerning for infectious or inflammatory colitis. Small focal severe narrowing is seen involving the distal sigmoid colon with apple-core appearance; this may represent benign stricture, but neoplasm cannot be excluded, and sigmoidoscopy is recommended. Aortic Atherosclerosis (ICD10-I70.0). Electronically Signed: By: Lupita Raider M.D. On: 08/25/2022 12:20     Medical Consultants:   Gastroenterology  Subjective:   Occasional abdominal pain.  Some nausea but no vomiting.  No chest pain or shortness of breath.  Objective:    Vitals:    08/26/22 1413 08/26/22 2109 08/27/22 0457 08/27/22 0829  BP: (!) 164/84 (!) 154/94 (!) 168/81 (!) 151/82  Pulse: 82 81 79 90  Resp: 16 18 18 16   Temp: 98.3 F (36.8 C) 99.1 F (37.3 C) 98.7 F (37.1 C) (!) 97.2 F (36.2 C)  TempSrc: Oral Oral Oral Temporal  SpO2: 93% 92% 95% 95%  Weight:      Height:       SpO2: 95 %   Intake/Output Summary (Last 24 hours) at 08/27/2022 0859 Last data filed at 08/27/2022 0600 Gross per 24 hour  Intake 557.71 ml  Output --  Net 557.71 ml    Filed Weights   08/17/22 1744  Weight: 122.5 kg    Exam:  General appearance: Awake alert.  In no distress Resp: Clear to auscultation bilaterally.  Normal effort Cardio: S1-S2 is normal regular.  No S3-S4.  No rubs murmurs or bruit GI: Abdomen is soft.  Tenderness in the abdomen without any rebound rigidity or guarding.  No masses organomegaly. Extremities: No edema.  Full range of motion of lower extremities. Neurologic: Alert and oriented x3.  No focal neurological deficits.     Data Reviewed:    Labs: Basic Metabolic Panel: Recent Labs  Lab 08/23/22 0332 08/24/22 0324 08/25/22 0341 08/27/22 0315  NA 136 137 137 137  K 3.4* 3.5 3.9 4.5  CL 102 101 97* 98  CO2 29 29 31 29   GLUCOSE 98 92 92 83  BUN 20 17 20 17   CREATININE 0.81 0.84 0.97 0.94  CALCIUM 8.2* 8.4* 8.5* 8.8*  MG  --  2.2  --   --     GFR Estimated Creatinine Clearance: 74 mL/min (by C-G formula based on SCr of 0.94 mg/dL).   CBC: Recent Labs  Lab 08/21/22 0325 08/22/22 0320 08/23/22 0333 08/25/22 0341 08/27/22 0315  WBC 8.5 8.7 10.7* 8.4 9.1  NEUTROABS 6.4 7.7 8.9*  --   --   HGB 10.1* 10.2* 10.3*  11.7* 11.9*  HCT 32.4* 32.4* 32.9* 37.3 37.9  MCV 89.3 88.5 88.7 89.0 88.1  PLT 243 262 278 294 267     CBG: Recent Labs  Lab 08/26/22 0715 08/26/22 1149 08/26/22 1651 08/26/22 2115 08/27/22 0726  GLUCAP 82 118* 133* 110* 76      Microbiology Recent Results (from the past 240 hour(s))   Calprotectin, Fecal     Status: Abnormal   Collection Time: 08/18/22  1:03 PM   Specimen: Stool  Result Value Ref Range Status   Calprotectin, Fecal >8,000 (H) 0 - 120 ug/g Final    Comment: (NOTE) **Results verified by repeat testing** Concentration     Interpretation   Follow-Up < 5 - 50 ug/g     Normal           None >50 -120 ug/g     Borderline       Re-evaluate in 4-6 weeks    >120 ug/g     Abnormal         Repeat as clinically                                   indicated Performed At: [facility] [address] [place], [place] [id number] Jolene Schimke MD EM:[id number]      Medications:    [MAR Hold] amLODipine  5 mg Oral Daily   [MAR Hold] insulin aspart  0-15 Units Subcutaneous TID WC   [MAR Hold] insulin aspart  0-5 Units Subcutaneous QHS   [MAR Hold] insulin aspart  3 Units Subcutaneous TID WC   [MAR Hold] levothyroxine  75 mcg Oral QAC breakfast   [MAR Hold] methylPREDNISolone (SOLU-MEDROL) injection  40 mg Intravenous Daily   [MAR Hold] montelukast  10 mg Oral QHS   [MAR Hold] pantoprazole  40 mg Oral Daily   [MAR Hold] polyethylene glycol  17 g Oral BID   [MAR Hold] sodium chloride flush  10-40 mL Intracatheter Q12H   [MAR Hold] venlafaxine XR  150 mg Oral BID   Continuous Infusions:  [MAR Hold] sodium chloride 10 mL/hr at 08/26/22 1244   sodium chloride     lactated ringers 20 mL/hr (08/27/22 0835)   [MAR Hold] piperacillin-tazobactam (ZOSYN)  IV 3.375 g (08/27/22 0534)      LOS: 10 days   [employer]  [facility]  08/27/2022, 8:59 AM

## 2022-08-27 NOTE — Interval H&P Note (Signed)
History and Physical Interval Note:  08/27/2022 8:53 AM  Whitney Higgins  has presented today for surgery, with the diagnosis of Ab pain, abnormal CT with narrowing/inflammation/rule out mass of sigmoid colon.  The various methods of treatment have been discussed with the patient and family. After consideration of risks, benefits and other options for treatment, the patient has consented to  Procedure(s): FLEXIBLE SIGMOIDOSCOPY (N/A) as a surgical intervention.  The patient's history has been reviewed, patient examined, no change in status, stable for surgery.  I have reviewed the patient's chart and labs.  Questions were answered to the patient's satisfaction.     Lynann Bologna

## 2022-08-27 NOTE — Anesthesia Procedure Notes (Signed)
Date/Time: 08/27/2022 9:20 AM  Performed by: Lovie Chol, CRNAPre-anesthesia Checklist: Patient identified, Emergency Drugs available, Suction available and Patient being monitored Oxygen Delivery Method: Simple face mask

## 2022-08-27 NOTE — Anesthesia Postprocedure Evaluation (Signed)
Anesthesia Post Note  Patient: Whitney Higgins  Procedure(s) Performed: FLEXIBLE SIGMOIDOSCOPY     Patient location during evaluation: PACU Anesthesia Type: MAC Level of consciousness: awake and alert Pain management: pain level controlled Vital Signs Assessment: post-procedure vital signs reviewed and stable Respiratory status: spontaneous breathing, nonlabored ventilation and respiratory function stable Cardiovascular status: blood pressure returned to baseline Postop Assessment: no apparent nausea or vomiting Anesthetic complications: no   No notable events documented.  Last Vitals:  Vitals:   08/27/22 0949 08/27/22 0950  BP: 121/78 137/74  Pulse: 80 77  Resp: 19 15  Temp:    SpO2: 99% 98%    Last Pain:  Vitals:   08/27/22 0829  TempSrc: Temporal  PainSc: 3                  Shanda Howells

## 2022-08-27 NOTE — Plan of Care (Signed)
  Problem: Pain Managment: Goal: General experience of comfort will improve Outcome: Progressing   Problem: Coping: Goal: Ability to adjust to condition or change in health will improve Outcome: Progressing   

## 2022-08-27 NOTE — Anesthesia Preprocedure Evaluation (Addendum)
Anesthesia Evaluation  Patient identified by MRN, date of birth, ID band Patient awake    Reviewed: Allergy & Precautions, NPO status , Patient's Chart, lab work & pertinent test results  History of Anesthesia Complications (+) PONV and history of anesthetic complications  Airway Mallampati: II  TM Distance: >3 FB Neck ROM: Full    Dental no notable dental hx. (+) Dental Advisory Given   Pulmonary asthma    Pulmonary exam normal        Cardiovascular hypertension, Pt. on medications + CAD  Normal cardiovascular exam     Neuro/Psych  Headaches  Anxiety Depression       GI/Hepatic Neg liver ROS,GERD  Medicated,,IBD, Ab pain, abnormal CT with narrowing/inflammation/rule out mass of sigmoid colon   Endo/Other  Hypothyroidism  Morbid obesity  Renal/GU negative Renal ROS     Musculoskeletal  (+) Arthritis ,    Abdominal   Peds  Hematology  (+) Blood dyscrasia (Hgb 11.9), anemia   Anesthesia Other Findings Day of surgery medications reviewed with patient.  Reproductive/Obstetrics                              Anesthesia Physical Anesthesia Plan  ASA: 3 and emergent  Anesthesia Plan: MAC   Post-op Pain Management: Minimal or no pain anticipated   Induction:   PONV Risk Score and Plan: 3 and Propofol infusion and Treatment may vary due to age or medical condition  Airway Management Planned: Natural Airway and Simple Face Mask  Additional Equipment: None  Intra-op Plan:   Post-operative Plan:   Informed Consent: I have reviewed the patients History and Physical, chart, labs and discussed the procedure including the risks, benefits and alternatives for the proposed anesthesia with the patient or authorized representative who has indicated his/her understanding and acceptance.     Dental advisory given  Plan Discussed with: CRNA  Anesthesia Plan Comments:          Anesthesia Quick Evaluation

## 2022-08-27 NOTE — Transfer of Care (Signed)
Immediate Anesthesia Transfer of Care Note  Patient: Whitney Higgins  Procedure(s) Performed: FLEXIBLE SIGMOIDOSCOPY  Patient Location: Endoscopy Unit  Anesthesia Type:MAC  Level of Consciousness: awake, oriented, and patient cooperative  Airway & Oxygen Therapy: Patient Spontanous Breathing and Patient connected to face mask oxygen  Post-op Assessment: Report given to RN and Post -op Vital signs reviewed and stable  Post vital signs: Reviewed  Last Vitals:  Vitals Value Taken Time  BP    Temp    Pulse    Resp    SpO2      Last Pain:  Vitals:   08/27/22 0829  TempSrc: Temporal  PainSc: 3       Patients Stated Pain Goal: 3 (08/26/22 0905)  Complications: No notable events documented.

## 2022-08-28 ENCOUNTER — Inpatient Hospital Stay (HOSPITAL_COMMUNITY): Payer: Medicare Other

## 2022-08-28 ENCOUNTER — Encounter (HOSPITAL_COMMUNITY): Payer: Self-pay | Admitting: Gastroenterology

## 2022-08-28 DIAGNOSIS — E039 Hypothyroidism, unspecified: Secondary | ICD-10-CM | POA: Diagnosis not present

## 2022-08-28 DIAGNOSIS — K50112 Crohn's disease of large intestine with intestinal obstruction: Secondary | ICD-10-CM

## 2022-08-28 DIAGNOSIS — R935 Abnormal findings on diagnostic imaging of other abdominal regions, including retroperitoneum: Secondary | ICD-10-CM

## 2022-08-28 DIAGNOSIS — F329 Major depressive disorder, single episode, unspecified: Secondary | ICD-10-CM

## 2022-08-28 DIAGNOSIS — K5792 Diverticulitis of intestine, part unspecified, without perforation or abscess without bleeding: Secondary | ICD-10-CM | POA: Diagnosis not present

## 2022-08-28 LAB — GLUCOSE, CAPILLARY
Glucose-Capillary: 84 mg/dL (ref 70–99)
Glucose-Capillary: 109 mg/dL — ABNORMAL HIGH (ref 70–99)
Glucose-Capillary: 181 mg/dL — ABNORMAL HIGH (ref 70–99)
Glucose-Capillary: 116 mg/dL — ABNORMAL HIGH (ref 70–99)

## 2022-08-28 MED ORDER — PEG-KCL-NACL-NASULF-NA ASC-C 100 G PO SOLR
0.5000 | Freq: Once | ORAL | Status: AC
  Administered 2022-08-28: 100 g via ORAL
  Filled 2022-08-28: qty 1

## 2022-08-28 MED ORDER — PREDNISONE 20 MG PO TABS: 40.0000 mg | ORAL_TABLET | Freq: Every day | ORAL | Status: DC

## 2022-08-28 MED ORDER — POLYETHYLENE GLYCOL 3350 17 G PO PACK: 17.0000 g | PACK | Freq: Two times a day (BID) | ORAL | Status: DC

## 2022-08-28 NOTE — Consult Note (Signed)
[facility] Surgery Consult Note  Whitney Higgins [birth year]  [id number].    Requesting MD: Yancey Flemings Chief Complaint/Reason for Consult: Crohn's colitis vs diverticulitis   HPI:  Whitney Higgins is a 67 y.o. female PMH Crohn's colitis, known diverticulosis, HTN, hypothyroidism, HLD, anxiety and depression who was admitted to [facility] 08/17/22 for further work up and management of worsening abdominal pain. Patient is followed by Dr. Leone Payor and previously maintained on Lialda for left-sided colitis. Prior to admission she complains of not feeling well for a couple months. Symptoms included intermittent lower abdominal pain/cramping, nausea and vomiting, chills/hot spells as well as a change in her bowel habits. This included intermittent loose stools, some obstipation, and some intermittent blood in her stool.   Patient underwent outpatient CT scan on 4/3 that showed wall thickening of the sigmoid colon with surrounding inflammatory changes and probable phlegmon in the sigmoid mesocolon consistent with acute inflammation; underlying sigmoid diverticulosis and findings could be secondary to diverticulitis; given the patient's history active Crohn's colitis not excluded; no other signs of active bowel inflammation and no drainable fluid collection identified; prominent stool throughout the colon with mild fecalization of the distal small bowel, no evidence of bowel obstruction.   Due to CT scan findings she was advised to go the ED for hospital hospital. Patient was started on IV cipro/flagyl and admitted to the medical service.   Patient failed to improve and was switched to IV zosyn on 4/7. She underwent repeat CT scan 4/7 and most recently 4/12. IV solu-medrol was started on 4/8. CT 4/12 showed moderate to severe wall thickening involving the proximal sigmoid colon possibly representing diverticulitis, although infectious or inflammatory colitis cannot be excluded; stable findings seen in the adjacent  peritoneal fat consistent with associated phlegmon or possibly early abscess; there is also now noted moderate wall thickening involving the descending colon also concerning for infectious or inflammatory colitis; small focal severe narrowing involving the distal sigmoid colon with apple-core appearance possibly representing benign stricture but neoplasm cannot be excluded.  Flexible sigmoidoscopy was attempted yesterday, 08/27/22, but the procedure was aborted due to poor endoscopic visualization. Diet was backed down to full liquids and she was started on a slow purge with movi-prep. Per their note they do not have any plans for repeat colonoscopy as they feel it unlikely to be able to differentiate between inflammatory process from diverticular disease versus IBD.  Given that she has had 10 days of IV antibiotics and 1 weeks of steroids without significant improvement, general surgery was asked to see today.  -Last colonoscopy was 04/03/2019: removal of 2 small polyps in the transverse colon, noted severe sigmoid diverticulosis with narrowing but no evidence of active colitis at that time. Random biopsies were done which showed mildly active chronic colitis with rare microgranulomas, the polyps were hyperplastic. Left colon biopsies at that time showed no active inflammation or features of chronicity.  -Abdominal surgical history: Appendectomy, C section, Cholecystectomy, partial R nephrectomy -Anticoagulants: none -Nonsmoker -Drinks alcohol occasionally -Denies illicit drug use  ROS: ROS As above, see hpi  Family History  Problem Relation Age of Onset   Asthma Mother    Allergic rhinitis Mother    Allergic rhinitis Father    Allergic rhinitis Sister    Allergic rhinitis Brother    Colon cancer Neg Hx    Esophageal cancer Neg Hx    Liver cancer Neg Hx    Pancreatic cancer Neg Hx    Rectal cancer Neg Hx  Stomach cancer Neg Hx    Colon polyps Neg Hx     Past Medical History:   Diagnosis Date   Abnormal alkaline phosphatase test 04/07/2019   Allergy    Anxiety    takes Xanax daily   Arthritis    Asthma    inhaler prn and Singulair daily;OTC Zyrtec daily   Cataract    Chronic back pain    stenosis   COVID-19 05/2019   Crohn's colitis (HCC) - left-sided 01/06/2019   Depression    takes Effexor daily   Diverticulosis    Gallstones    GERD (gastroesophageal reflux disease)    takes Omeprazole daily   H/O: pituitary tumor    History of blood transfusion    no abnormal reaction noted   History of bronchitis    last time less than a yr ago   History of colon polyps    Hx of colonic polyps 01/10/2019   Hyperlipidemia    takes Pravastatin daily   Hypertension    takes Amlodipine and Lisinopril daily   Hypothyroidism    takes synthroid and cytomel daily   IBD (inflammatory bowel disease) UC vs Crohn's 01/06/2019   Insomnia    takes Xanax nightly   Joint pain    Pneumonia    hx of;last time about 42yrs ago   PONV (postoperative nausea and vomiting)    Shortness of breath    with exertion   Weakness    left leg    Past Surgical History:  Procedure Laterality Date   APPENDECTOMY  1974   CESAREAN SECTION  1981   CHOLECYSTECTOMY     COLONOSCOPY     DILATION AND CURETTAGE OF UTERUS  1979   epidural injections     EYE SURGERY     at age 69;vision correction   FLEXIBLE SIGMOIDOSCOPY N/A 08/27/2022   Procedure: FLEXIBLE SIGMOIDOSCOPY;  Surgeon: Lynann Bologna, MD;  Location: [facility];  Service: Gastroenterology;  Laterality: N/A;   JOINT REPLACEMENT  2010   right hip   LUMBAR LAMINECTOMY/DECOMPRESSION MICRODISCECTOMY Left 03/18/2013   Procedure: LEFT LUMBAR FOUR-FIVE,LUMBAR FIVE-SACRAL ONE MICRODISCECTOMY;  Surgeon: Reinaldo Meeker, MD;  Location: [facility];  Service: Neurosurgery;  Laterality: Left;  left   partial removal of right kidney  1971   pituitary tumor removed  1983   POLYPECTOMY     SHOULDER SURGERY Right     Social History:   reports that she has never smoked. She has never used smokeless tobacco. She reports current alcohol use. She reports that she does not use drugs.  Allergies:  Allergies  Allergen Reactions   Codeine Nausea And Vomiting   Clarithromycin Rash   Dicyclomine Hcl     Dizzy, and headache   Penicillins Rash    Medications Prior to Admission  Medication Sig Dispense Refill   acetaminophen (TYLENOL) 500 MG tablet Take 1,000 mg by mouth every 6 (six) hours as needed for pain.     albuterol (PROVENTIL HFA;VENTOLIN HFA) 108 (90 BASE) MCG/ACT inhaler Inhale 2 puffs into the lungs every 6 (six) hours as needed for wheezing.     albuterol (PROVENTIL) (2.5 MG/3ML) 0.083% nebulizer solution Take 2.5 mg by nebulization every 4 (four) hours as needed for wheezing or shortness of breath.     ALPRAZolam (XANAX) 0.5 MG tablet Take 0.5 mg by mouth at bedtime as needed for sleep.     amLODipine (NORVASC) 5 MG tablet Take 5 mg by mouth daily.  atorvastatin (LIPITOR) 20 MG tablet Take 20 mg by mouth daily.     cetirizine (ZYRTEC) 10 MG tablet Take 10 mg by mouth daily.     Cyanocobalamin (B12 LIQUID HEALTH BOOSTER PO) Take 5,000 mcg by mouth daily.     fluticasone (FLONASE) 50 MCG/ACT nasal spray Place 1 spray into both nostrils daily as needed for allergies or rhinitis.     hyoscyamine (LEVSIN SL) 0.125 MG SL tablet Place 1 tablet (0.125 mg total) under the tongue every 4 (four) hours as needed. (Patient taking differently: Place 0.125 mg under the tongue every 4 (four) hours as needed for cramping.) 30 tablet 0   levothyroxine (SYNTHROID) 75 MCG tablet Take 75 mcg by mouth daily.     LIALDA 1.2 g EC tablet TAKE TWO TABLETS BY MOUTH TWICE A DAY (Patient taking differently: Take 1.2 g by mouth 2 (two) times daily.) 360 tablet 0   liothyronine (CYTOMEL) 5 MCG tablet Take 5 mcg by mouth daily.     lisinopril-hydrochlorothiazide (PRINZIDE,ZESTORETIC) 20-25 MG per tablet Take 1 tablet by mouth daily.      montelukast (SINGULAIR) 10 MG tablet Take 10 mg by mouth daily.     naproxen (NAPROSYN) 500 MG tablet Take 500 mg by mouth daily as needed for moderate pain.     ondansetron (ZOFRAN) 4 MG tablet Take 1 tablet (4 mg total) by mouth every 8 (eight) hours as needed for nausea or vomiting. 30 tablet 1   potassium chloride (KLOR-CON) 10 MEQ tablet Take 10 mEq by mouth daily.     venlafaxine XR (EFFEXOR-XR) 150 MG 24 hr capsule Take 150 mg by mouth 2 (two) times daily.     Vitamin D, Ergocalciferol, (DRISDOL) 1.25 MG (50000 UNIT) CAPS capsule Take 50,000 Units by mouth every 7 (seven) days.      Prior to Admission medications   Medication Sig Start Date End Date Taking? Authorizing Provider  acetaminophen (TYLENOL) 500 MG tablet Take 1,000 mg by mouth every 6 (six) hours as needed for pain.   Yes [provider]  albuterol (PROVENTIL HFA;VENTOLIN HFA) 108 (90 BASE) MCG/ACT inhaler Inhale 2 puffs into the lungs every 6 (six) hours as needed for wheezing.   Yes [provider]  albuterol (PROVENTIL) (2.5 MG/3ML) 0.083% nebulizer solution Take 2.5 mg by nebulization every 4 (four) hours as needed for wheezing or shortness of breath. 04/18/16  Yes [provider]  ALPRAZolam Prudy Feeler) 0.5 MG tablet Take 0.5 mg by mouth at bedtime as needed for sleep.   Yes [provider]  amLODipine (NORVASC) 5 MG tablet Take 5 mg by mouth daily.   Yes [provider]  atorvastatin (LIPITOR) 20 MG tablet Take 20 mg by mouth daily.   Yes [provider]  cetirizine (ZYRTEC) 10 MG tablet Take 10 mg by mouth daily.   Yes [provider]  Cyanocobalamin (B12 LIQUID HEALTH BOOSTER PO) Take 5,000 mcg by mouth daily.   Yes [provider]  fluconazole (DIFLUCAN) 150 MG tablet Take 1 tablet (150 mg total) by mouth daily. Take 1 tablet and repeat in 72 hours in symptoms persistent. 08/23/22   Esterwood, Amy S, PA-C  fluticasone (FLONASE) 50 MCG/ACT nasal spray  Place 1 spray into both nostrils daily as needed for allergies or rhinitis. 02/10/19  Yes [provider]  hyoscyamine (LEVSIN SL) 0.125 MG SL tablet Place 1 tablet (0.125 mg total) under the tongue every 4 (four) hours as needed. Patient taking differently: Place 0.125 mg  under the tongue every 4 (four) hours as needed for cramping. 08/15/22  Yes Iva Boop, MD  levothyroxine (SYNTHROID) 75 MCG tablet Take 75 mcg by mouth daily. 11/06/18  Yes [provider]  LIALDA 1.2 g EC tablet TAKE TWO TABLETS BY MOUTH TWICE A DAY Patient taking differently: Take 1.2 g by mouth 2 (two) times daily. 04/27/22  Yes Iva Boop, MD  liothyronine (CYTOMEL) 5 MCG tablet Take 5 mcg by mouth daily. 12/04/18  Yes [provider]  lisinopril-hydrochlorothiazide (PRINZIDE,ZESTORETIC) 20-25 MG per tablet Take 1 tablet by mouth daily.   Yes [provider]  montelukast (SINGULAIR) 10 MG tablet Take 10 mg by mouth daily.   Yes [provider]  naproxen (NAPROSYN) 500 MG tablet Take 500 mg by mouth daily as needed for moderate pain. 01/25/19  Yes [provider]  ondansetron (ZOFRAN) 4 MG tablet Take 1 tablet (4 mg total) by mouth every 8 (eight) hours as needed for nausea or vomiting. 08/15/22  Yes Iva Boop, MD  potassium chloride (KLOR-CON) 10 MEQ tablet Take 10 mEq by mouth daily. 10/29/15  Yes [provider]  venlafaxine XR (EFFEXOR-XR) 150 MG 24 hr capsule Take 150 mg by mouth 2 (two) times daily.   Yes [provider]  Vitamin D, Ergocalciferol, (DRISDOL) 1.25 MG (50000 UNIT) CAPS capsule Take 50,000 Units by mouth every 7 (seven) days. 04/17/22  Yes [provider]    Blood pressure (!) 147/80, pulse 82, temperature 97.7 F (36.5 C), temperature source Oral, resp. rate 17, height  (1.626 m), weight 122.5 kg, SpO2 94 %. Physical Exam: General: pleasant, WD/WN female who is laying in bed in NAD HEENT: head is normocephalic,  atraumatic.  Sclera are noninjected.  Pupils equal and round.  Ears and nose without any masses or lesions.  Mouth is pink and moist. Dentition fair Heart: regular, rate, and rhythm.  Normal s1,s2. No obvious murmurs, gallops, or rubs noted.  Palpable radial and pedal pulses bilaterally  Lungs: CTAB, no wheezes, rhonchi, or rales noted.  Respiratory effort nonlabored Abd: Soft, ND, ttp to the lower abdomen more on the R > L. +BS. No masses, hernias, or organomegaly. Prior midline incision well healed.  MS: no BUE/BLE edema Skin: warm and dry with no masses, lesions, or rashes Psych: A&Ox4 with an appropriate affect Neuro: MAEs, no gross motor deficits   Results for orders placed or performed during the hospital encounter of 08/17/22 (from the past 48 hour(s))  Glucose, capillary     Status: Abnormal   Collection Time: 08/26/22  4:51 PM  Result Value Ref Range   Glucose-Capillary 133 (H) 70 - 99 mg/dL    Comment: Glucose reference range applies only to samples taken after fasting for at least 8 hours.  Glucose, capillary     Status: Abnormal   Collection Time: 08/26/22  9:15 PM  Result Value Ref Range   Glucose-Capillary 110 (H) 70 - 99 mg/dL    Comment: Glucose reference range applies only to samples taken after fasting for at least 8 hours.  CBC     Status: Abnormal   Collection Time: 08/27/22  3:15 AM  Result Value Ref Range   WBC 9.1 4.0 - 10.5 K/uL   RBC 4.30 3.87 - 5.11 MIL/uL   Hemoglobin 11.9 (L) 12.0 - 15.0 g/dL   HCT 16.1 09.6 - 04.5 %   MCV 88.1 80.0 - 100.0 fL   MCH 27.7 26.0 - 34.0 pg  MCHC 31.4 30.0 - 36.0 g/dL   RDW 16.1 09.6 - 04.5 %   Platelets 267 150 - 400 K/uL   nRBC 0.0 0.0 - 0.2 %    Comment: Performed at [facility], [address], [place], [place] [address]  Basic metabolic panel     Status: Abnormal   Collection Time: 08/27/22  3:15 AM  Result Value Ref Range   Sodium 137 135 - 145 mmol/L   Potassium 4.5 3.5 - 5.1 mmol/L    Comment:  HEMOLYSIS AT THIS LEVEL MAY AFFECT RESULT   Chloride 98 98 - 111 mmol/L   CO2 29 22 - 32 mmol/L   Glucose, Bld 83 70 - 99 mg/dL    Comment: Glucose reference range applies only to samples taken after fasting for at least 8 hours.   BUN 17 8 - 23 mg/dL   Creatinine, Ser 1.91 0.44 - 1.00 mg/dL   Calcium 8.8 (L) 8.9 - 10.3 mg/dL   GFR, Estimated >47 >82 mL/min    Comment: (NOTE) Calculated using the CKD-EPI Creatinine Equation (2021)    Anion gap 10 5 - 15    Comment: Performed at [facility], [address] [place], [place] [address]  Glucose, capillary     Status: None   Collection Time: 08/27/22  7:26 AM  Result Value Ref Range   Glucose-Capillary 76 70 - 99 mg/dL    Comment: Glucose reference range applies only to samples taken after fasting for at least 8 hours.  Glucose, capillary     Status: None   Collection Time: 08/27/22 11:53 AM  Result Value Ref Range   Glucose-Capillary 94 70 - 99 mg/dL    Comment: Glucose reference range applies only to samples taken after fasting for at least 8 hours.  Glucose, capillary     Status: Abnormal   Collection Time: 08/27/22  4:34 PM  Result Value Ref Range   Glucose-Capillary 175 (H) 70 - 99 mg/dL    Comment: Glucose reference range applies only to samples taken after fasting for at least 8 hours.  Glucose, capillary     Status: Abnormal   Collection Time: 08/27/22  8:40 PM  Result Value Ref Range   Glucose-Capillary 123 (H) 70 - 99 mg/dL    Comment: Glucose reference range applies only to samples taken after fasting for at least 8 hours.  Glucose, capillary     Status: None   Collection Time: 08/28/22  7:18 AM  Result Value Ref Range   Glucose-Capillary 84 70 - 99 mg/dL    Comment: Glucose reference range applies only to samples taken after fasting for at least 8 hours.  Glucose, capillary     Status: Abnormal   Collection Time: 08/28/22 11:07 AM  Result Value Ref Range   Glucose-Capillary 109 (H) 70 - 99 mg/dL     Comment: Glucose reference range applies only to samples taken after fasting for at least 8 hours.   DG Abd 2 Views  Result Date: 08/28/2022 CLINICAL DATA:  Assess stool burden.  Pain EXAM: ABDOMEN - 2 VIEW COMPARISON:  CT 08/25/2022 and older FINDINGS: There is presumed contrast scattered throughout the nondilated colon and some nondilated loops of small bowel with air as well. Mild colonic stool suggested. No obstruction. No definite free air on these supine radiographs. Surgical clips in the right upper quadrant. Right hip arthroplasty. Curvature and degenerative changes of the spine. IMPRESSION: Nonspecific bowel gas pattern with diffuse bowel contrast. Mild  scattered colonic stool Electronically Signed   By: Karen Kays M.D.   On: 08/28/2022 12:58    Anti-infectives (From admission, onward)    Start     Dose/Rate Route Frequency Ordered Stop   08/20/22 0900  piperacillin-tazobactam (ZOSYN) IVPB 3.375 g        3.375 g 12.5 mL/hr over 240 Minutes Intravenous Every 8 hours 08/20/22 0812     08/20/22 0845  piperacillin-tazobactam (ZOSYN) IVPB 3.375 g  Status:  Discontinued        3.375 g 100 mL/hr over 30 Minutes Intravenous Every 6 hours 08/20/22 0758 08/20/22 0812   08/17/22 1530  ciprofloxacin (CIPRO) IVPB 400 mg  Status:  Discontinued        400 mg 200 mL/hr over 60 Minutes Intravenous Every 12 hours 08/17/22 1453 08/20/22 0758   08/17/22 1430  metroNIDAZOLE (FLAGYL) IVPB 500 mg  Status:  Discontinued        500 mg 100 mL/hr over 60 Minutes Intravenous Every 12 hours 08/17/22 1428 08/20/22 0758   08/17/22 1430  ciprofloxacin (CIPRO) IVPB 400 mg  Status:  Discontinued        400 mg 200 mL/hr over 60 Minutes Intravenous  Once 08/17/22 1428 08/17/22 1453        Assessment/Plan History of Crohn's colitis Exacerbation of Crohn's colitis with phlegmon versus Sigmoid colon diverticulitis with phlegmon  Patient admitted 11 days ago with concerns for Exacerbation of Crohn's colitis  with phlegmon versus Sigmoid colon diverticulitis with phlegmon. She has received 10 days of IV antibiotics and a week of IV steroids with no improvement in her symptoms. Most recent CT scan 4/12 again showed possible sigmoid colon diverticulitis vs colitis with stable associated phlegmon or possibly early abscess; new wall thickening involving the descending colon also concerning for infectious or inflammatory colitis; small focal severe narrowing involving the distal sigmoid colon with apple-core appearance possibly representing benign stricture but neoplasm cannot be excluded. Flexible sigmoidoscopy was attempted yesterday, 08/27/22, but the procedure was aborted due to poor endoscopic visualization.  I think patient has failed non operative management. Recommend exploratory laparotomy, partial colectomy, end colostomy. The planned procedure and material risks were discussed with the patient. Risks include but are not limited to anesthesia (MI, CVA, prolonged intubation, aspiration, death), pain, bleeding,  infection, scarring, hernia, damage to surrounding structures (blood vessels/nerves/viscus/organs/ureter) and ileus. We also discussed typical post-operative care including the possible need for rehab/snf if necessary. The patient's questions were answered to their satisfaction, they voiced understanding and elected to proceed with surgery. Will have wocn mark the patient.  ID - zosyn VTE - SCDs, ok for chemical dvt ppx from surgical standpoint FEN - FLD. NPO at midnight.  Foley - none  HTN Hypothyroidism HLD Anxiety and depression  I reviewed nursing notes, Consultant (GI) notes, hospitalist notes, last 24 h vitals and pain scores, last 48 h intake and output, last 24 h labs and trends, and last 24 h imaging results.   Leary Roca, [facility] Surgery 08/28/2022, 3:41 PM Please see Amion for pager number during day hours 7:00am-4:30pm

## 2022-08-28 NOTE — Plan of Care (Signed)
  Problem: Pain Managment: Goal: General experience of comfort will improve 08/28/2022 2343 by Virgilio Belling, RN Outcome: Progressing

## 2022-08-28 NOTE — Progress Notes (Signed)
[facility] PROGRESS NOTE    Progress Note  Whitney Higgins  YQM:[id number] DOB: 1955/10/13 DOA: 08/17/2022 PCP: Montez Hageman, DO     Brief Narrative:   Whitney Higgins is an 67 y.o. female past medical history significant for seasonal allergies anxiety asthma chronic back pain inflammatory bowel disease sent by the GI doctor to the emergency room for abdominal pain CT scan of the abdomen and pelvis showed inflammatory changes and probably phlegmon in the sigmoid mesocolon consistent with acute diverticulitis   Assessment/Plan:   Acute diverticulitis superimposed on Crohn's colitis: CT of the abdomen and pelvis showed no significant changes in pelvic inflammatory process of the sigmoid colon and distal ileum with an adjacent phlegmon. Patient was initially placed on Zosyn. Subsequently gastroenterology was consulted.  Steroids were initiated. CT was repeated on 4/12. Similar findings to previous CT suggesting diverticulitis or inflammatory or infectious colitis.  Stable findings of possible abscess.  Narrowing of the distal sigmoid colon also noted. Plan was to do flexible sigmoidoscopy on 4/14.  However this could not be done due to poor prep. Gastroenterology has ordered abdominal films.  They might consider transitioning the patient to oral steroids and oral antibiotics today. Currently on Zosyn and Solu-Medrol.  WBC was normal yesterday. GI is considering initiating biological agents in the outpatient setting.  Hyperglycemia: Elevated glucose levels are secondary to steroids.  She has no history of diabetes.  HbA1c 6.4.    Normocytic anemia No evidence of overt blood loss.  Drop in hemoglobin is likely dilutional.  Stable.  Hypokalemia Repleted.  Asthma: Stable.  Chronic anxiety/major depression: Continue current meds.  Essential hypertension Continue amlodipine.  Occasional high readings noted which could be secondary to pain.    Hypothyroidism Continue  levothyroxine.  Hypercholesterolemia Continue statins.  Abnormal alkaline phosphatase test/Transaminitis Resolved likely due to acute infectious etiology.  Obesity, Class III Estimated body mass index is 46.37 kg/m as calculated from the following:   Height as of this encounter: 5\' 4"  (1.626 m).   Weight as of this encounter: 122.5 kg.   DVT prophylaxis: lovenox CODE STATUS: Full code Family Communication: Discussed with patient Disposition: Return home when cleared by gastroenterology.  Status is: Inpatient Remains inpatient appropriate because: Acute diverticulitis    IV Access:   Peripheral IV   Procedures and diagnostic studies:   No results found.   Medical Consultants:   Gastroenterology  Subjective:   Occasional abdominal pain but none currently.  No nausea or vomiting.  No shortness of breath.  Ambulating in the hallway.  Objective:    Vitals:   08/27/22 1000 08/27/22 1632 08/27/22 2045 08/28/22 0708  BP: (!) 157/83 (!) 162/89 (!) 150/77 (!) 149/71  Pulse: 76 90 83 70  Resp: 13 16 17 18   Temp:  98.6 F (37 C) 97.8 F (36.6 C) 98.6 F (37 C)  TempSrc:  Oral Oral   SpO2: 92% 96% 95% 97%  Weight:      Height:       SpO2: 97 % O2 Flow Rate (L/min): 4 L/min   Intake/Output Summary (Last 24 hours) at 08/28/2022 0853 Last data filed at 08/28/2022 0600 Gross per 24 hour  Intake 767.41 ml  Output --  Net 767.41 ml    Filed Weights   08/17/22 1744  Weight: 122.5 kg    Exam:  General appearance: Awake alert.  In no distress Resp: Clear to auscultation bilaterally.  Normal effort Cardio: S1-S2 is normal regular.  No S3-S4.  No rubs  murmurs or bruit GI: Abdomen is soft.  Nontender nondistended.  Bowel sounds are present normal.  No masses organomegaly Extremities: No edema.  Full range of motion of lower extremities. Neurologic: Alert and oriented x3.  No focal neurological deficits.      Data Reviewed:    Labs: Basic Metabolic  Panel: Recent Labs  Lab 08/23/22 0332 08/24/22 0324 08/25/22 0341 08/27/22 0315  NA 136 137 137 137  K 3.4* 3.5 3.9 4.5  CL 102 101 97* 98  CO2 29 29 31 29   GLUCOSE 98 92 92 83  BUN 20 17 20 17   CREATININE 0.81 0.84 0.97 0.94  CALCIUM 8.2* 8.4* 8.5* 8.8*  MG  --  2.2  --   --     GFR Estimated Creatinine Clearance: 74 mL/min (by C-G formula based on SCr of 0.94 mg/dL).   CBC: Recent Labs  Lab 08/22/22 0320 08/23/22 0333 08/25/22 0341 08/27/22 0315  WBC 8.7 10.7* 8.4 9.1  NEUTROABS 7.7 8.9*  --   --   HGB 10.2* 10.3* 11.7* 11.9*  HCT 32.4* 32.9* 37.3 37.9  MCV 88.5 88.7 89.0 88.1  PLT 262 278 294 267     CBG: Recent Labs  Lab 08/27/22 0726 08/27/22 1153 08/27/22 1634 08/27/22 2040 08/28/22 0718  GLUCAP 76 94 175* 123* 84      Microbiology Recent Results (from the past 240 hour(s))  Calprotectin, Fecal     Status: Abnormal   Collection Time: 08/18/22  1:03 PM   Specimen: Stool  Result Value Ref Range Status   Calprotectin, Fecal >8,000 (H) 0 - 120 ug/g Final    Comment: (NOTE) **Results verified by repeat testing** Concentration     Interpretation   Follow-Up < 5 - 50 ug/g     Normal           None >50 -120 ug/g     Borderline       Re-evaluate in 4-6 weeks    >120 ug/g     Abnormal         Repeat as clinically                                   indicated Performed At: [facility] [address] [place], [place] [id number] Jolene Schimke MD NU:[id number]      Medications:    amLODipine  5 mg Oral Daily   insulin aspart  0-15 Units Subcutaneous TID WC   insulin aspart  0-5 Units Subcutaneous QHS   insulin aspart  3 Units Subcutaneous TID WC   levothyroxine  75 mcg Oral QAC breakfast   methylPREDNISolone (SOLU-MEDROL) injection  40 mg Intravenous Daily   montelukast  10 mg Oral QHS   pantoprazole  40 mg Oral Daily   polyethylene glycol  17 g Oral BID   sodium chloride flush  10-40 mL Intracatheter Q12H   venlafaxine XR  150 mg  Oral BID   Continuous Infusions:  sodium chloride Stopped (08/27/22 1417)   piperacillin-tazobactam (ZOSYN)  IV 3.375 g (08/28/22 0622)      LOS: 11 days   Trequan Marsolek  [facility]  08/28/2022, 8:53 AM

## 2022-08-28 NOTE — Progress Notes (Addendum)
Patient ID: Whitney Higgins, female   DOB: 05/29/1955, 67 y.o.   MRN: [id number]    Progress Note   Subjective   Day # 11 CC; history of Crohn's colitis-exacerbation of Crohn's colitis with phlegmon versus diverticulitis with phlegmon sigmoid colon  IV Zosyn IV Solu-Medrol-converted to prednisone yesterday  Repeat CT 08/25/2022-increased amount of stool in the right and transverse colon suggesting that the inflammatory process involving the descending and sigmoid colon is resulting in probable partial obstruction-there is again noted moderate to severe wall thickening involving the proximal sigmoid colon as well as moderate wall thickening of the descending colon concerning for diverticulitis or other infectious/inflammatory colitis stable finding in the adjacent peritoneal fat consistent with phlegmon or possible early abscess, severe narrowing involving the distal sigmoid colon with apple core like appearance  Flexible sigmoidoscopy 08/27/2022-moderate amount of stool in the rectum and rectosigmoid precluding visualization, aborted due to poor visualization  Labs this a.m. pending 08/27/2022 WBC 9.1/hemoglobin 11.9 CRP 1.1 Fecal calprotectin on 08/18/2022 greater than 8000-repeat is pending  Unfortunately she has been feeling worse over the past couple of days, increased abdominal pain after eating, describes bad cramping in the lower abdomen right greater than left, she has been passing a little bit of flatus no bowel movements over the past 3 to 4 days other than with the tapwater enema yesterday morning.   Objective   Vital signs in last 24 hours: Temp:  [97.8 F (36.6 C)-98.6 F (37 C)] 98.6 F (37 C) (04/15 0708) Pulse Rate:  [70-90] 70 (04/15 0708) Resp:  [13-19] 18 (04/15 0708) BP: (121-162)/(71-89) 149/71 (04/15 0708) SpO2:  [92 %-99 %] 97 % (04/15 0708) Last BM Date : 08/27/22 General:   Older white female in NAD, husband at bedside Heart:  Regular rate and rhythm; no  murmurs Lungs: Respirations even and unlabored, lungs CTA bilaterally Abdomen:  Soft, obese, no appreciable distention , sounds somewhat hyperactive she is tender across the lower abdomen right lower quadrant and suprapubic greater than left no rebound Extremities:  Without edema. Neurologic:  Alert and oriented,  grossly normal neurologically. Psych:  Cooperative. Normal mood and affect.  Intake/Output from previous day: 04/14 0701 - 04/15 0700 In: 767.4 [P.O.:480; I.V.:159.9; IV Piggyback:127.5] Out: -  Intake/Output this shift: No intake/output data recorded.  Lab Results: Recent Labs    08/27/22 0315  WBC 9.1  HGB 11.9*  HCT 37.9  PLT 267   BMET Recent Labs    08/27/22 0315  NA 137  K 4.5  CL 98  CO2 29  GLUCOSE 83  BUN 17  CREATININE 0.94  CALCIUM 8.8*   LFT No results for input(s): "PROT", "ALBUMIN", "AST", "ALT", "ALKPHOS", "BILITOT", "BILIDIR", "IBILI" in the last 72 hours. PT/INR No results for input(s): "LABPROT", "INR" in the last 72 hours.     Assessment / Plan:    #93 67 year old white female with history of Crohn's colitis previously managed with Lialda presenting with 76-month history of progressive symptoms and on admission imaging found to have a severe inflammatory process of the sigmoid colon unclear whether this represented acute diverticulitis versus severe Crohn's colitis with associated phlegmon and possible early abscess.  Most recent imaging 3 days ago now more concerning for severe sigmoid stricture with associated inflammatory process/phlegmon-  Unfortunately she is not making much progress at this point despite 10 days of IV antibiotics, and 1 week of steroids.  She seemed initially to respond to steroids over the past couple of days has had increase  in lower abdominal pain particularly on the right, and is not passing any stool despite twice daily MiraLAX Attempted sigmoidoscopy yesterday unsuccessful due to poor prep   Plan; will back  off to full liquids Give her half of a movie prep today to see if we can start to purge bowel Consider repeat attempt at flexible sigmoidoscopy if good results with partial movie prep Will consult surgery-she may ultimately require resection  Will continue oral prednisone today Continue Zosyn today     Principal Problem:   Acute diverticulitis Active Problems:   Crohn's colitis (HCC) - left-sided   Obesity, Class III, BMI 40-49.9 (morbid obesity)   Acquired hypothyroidism   Asthma   Chronic anxiety   Essential hypertension   Hypercholesterolemia   Major depressive disorder   Abnormal alkaline phosphatase test   Transaminitis   Abnormal CT scan, colon   Abdominal pain, left lower quadrant   RLQ abdominal pain     LOS: 11 days   Amy EsterwoodPA-C  08/28/2022, 9:00 AM  GI ATTENDING  Interval history and data reviewed.  Patient seen and examined.  Signout provided by Dr. Chales Abrahams.  Agree with interval progress note.  Patient has ongoing problems with lower abdominal pain.  Has inflammatory process with with partial bowel obstruction at the level of sigmoid colon.  The differential diagnosis remains diverticulitis with phlegmonous change versus Crohn's.  Cancer less likely given prior colonoscopies.  I favor complicated diverticulitis.  Currently being treated with both antibiotics and steroids.  Agree with backing down on diet and slow purge.  Continue with medical therapies.  Agree with surgical opinion.  No plans for colonoscopy at this time.  Unlikely to be able to differentiate between inflammatory process from diverticular disease versus IBD.  Will follow.  Wilhemina Bonito. Eda Keys., M.D. [facility] Division of Gastroenterology

## 2022-08-29 ENCOUNTER — Other Ambulatory Visit: Payer: Self-pay

## 2022-08-29 ENCOUNTER — Encounter (HOSPITAL_COMMUNITY)
Admission: EM | Disposition: A | Discharge status: Home / Self Care | Payer: Self-pay | Source: Home / Self Care | Attending: Internal Medicine

## 2022-08-29 ENCOUNTER — Inpatient Hospital Stay (HOSPITAL_COMMUNITY): Payer: Medicare Other | Admitting: Certified Registered Nurse Anesthetist

## 2022-08-29 DIAGNOSIS — K56609 Unspecified intestinal obstruction, unspecified as to partial versus complete obstruction: Secondary | ICD-10-CM

## 2022-08-29 DIAGNOSIS — I2510 Atherosclerotic heart disease of native coronary artery without angina pectoris: Secondary | ICD-10-CM

## 2022-08-29 DIAGNOSIS — K529 Noninfective gastroenteritis and colitis, unspecified: Secondary | ICD-10-CM

## 2022-08-29 DIAGNOSIS — E039 Hypothyroidism, unspecified: Secondary | ICD-10-CM | POA: Diagnosis not present

## 2022-08-29 DIAGNOSIS — R935 Abnormal findings on diagnostic imaging of other abdominal regions, including retroperitoneum: Secondary | ICD-10-CM | POA: Diagnosis not present

## 2022-08-29 DIAGNOSIS — K5792 Diverticulitis of intestine, part unspecified, without perforation or abscess without bleeding: Secondary | ICD-10-CM | POA: Diagnosis not present

## 2022-08-29 DIAGNOSIS — K50112 Crohn's disease of large intestine with intestinal obstruction: Secondary | ICD-10-CM | POA: Diagnosis not present

## 2022-08-29 DIAGNOSIS — I10 Essential (primary) hypertension: Secondary | ICD-10-CM

## 2022-08-29 HISTORY — PX: LAPAROTOMY: SHX154

## 2022-08-29 LAB — CBC
HCT: 40.4 % (ref 36.0–46.0)
Hemoglobin: 12.9 g/dL (ref 12.0–15.0)
MCH: 28.3 pg (ref 26.0–34.0)
MCHC: 31.9 g/dL (ref 30.0–36.0)
MCV: 88.6 fL (ref 80.0–100.0)
Platelets: 263 10*3/uL (ref 150–400)
RBC: 4.56 MIL/uL (ref 3.87–5.11)
RDW: 16.1 % — ABNORMAL HIGH (ref 11.5–15.5)
WBC: 10.6 10*3/uL — ABNORMAL HIGH (ref 4.0–10.5)
nRBC: 0 % (ref 0.0–0.2)

## 2022-08-29 LAB — SURGICAL PCR SCREEN
MRSA, PCR: NEGATIVE
Staphylococcus aureus: POSITIVE — AB

## 2022-08-29 LAB — TYPE AND SCREEN
ABO/RH(D): O NEG
Antibody Screen: NEGATIVE

## 2022-08-29 LAB — BASIC METABOLIC PANEL
Anion gap: 12 (ref 5–15)
BUN: 16 mg/dL (ref 8–23)
CO2: 28 mmol/L (ref 22–32)
Calcium: 8.5 mg/dL — ABNORMAL LOW (ref 8.9–10.3)
Chloride: 97 mmol/L — ABNORMAL LOW (ref 98–111)
Creatinine, Ser: 0.96 mg/dL (ref 0.44–1.00)
GFR, Estimated: >60 mL/min (ref >60)
Glucose, Bld: 102 mg/dL — ABNORMAL HIGH (ref 70–99)
Potassium: 3.6 mmol/L (ref 3.5–5.1)
Sodium: 137 mmol/L (ref 135–145)

## 2022-08-29 LAB — GLUCOSE, CAPILLARY
Glucose-Capillary: 110 mg/dL — ABNORMAL HIGH (ref 70–99)
Glucose-Capillary: 199 mg/dL — ABNORMAL HIGH (ref 70–99)
Glucose-Capillary: 193 mg/dL — ABNORMAL HIGH (ref 70–99)

## 2022-08-29 LAB — ABO/RH: ABO/RH(D): O NEG

## 2022-08-29 SURGERY — LAPAROTOMY, EXPLORATORY
Anesthesia: General

## 2022-08-29 MED ORDER — OXYCODONE HCL 5 MG/5ML PO SOLN: 5.0000 mg | Freq: Once | ORAL | Status: AC | PRN

## 2022-08-29 MED ORDER — KETAMINE HCL 50 MG/5ML IJ SOSY
PREFILLED_SYRINGE | INTRAMUSCULAR | Status: AC
  Filled 2022-08-29: qty 5

## 2022-08-29 MED ORDER — SODIUM CHLORIDE 0.9 % IR SOLN
Status: DC | PRN
  Administered 2022-08-29: 2000 mL
  Administered 2022-08-29: 1000 mL
  Administered 2022-08-29: 2000 mL

## 2022-08-29 MED ORDER — SUCCINYLCHOLINE CHLORIDE 200 MG/10ML IV SOSY
PREFILLED_SYRINGE | INTRAVENOUS | Status: AC
  Filled 2022-08-29: qty 10

## 2022-08-29 MED ORDER — LIDOCAINE HCL (PF) 2 % IJ SOLN
INTRAMUSCULAR | Status: AC
  Filled 2022-08-29: qty 5

## 2022-08-29 MED ORDER — OXYCODONE HCL 5 MG PO TABS
ORAL_TABLET | ORAL | Status: AC
  Filled 2022-08-29: qty 1

## 2022-08-29 MED ORDER — ONDANSETRON HCL 4 MG/2ML IJ SOLN
INTRAMUSCULAR | Status: DC | PRN
  Administered 2022-08-29: 4 mg via INTRAVENOUS

## 2022-08-29 MED ORDER — PHENYLEPHRINE HCL-NACL 20-0.9 MG/250ML-% IV SOLN
INTRAVENOUS | Status: DC | PRN
  Administered 2022-08-29: 50 ug/min via INTRAVENOUS

## 2022-08-29 MED ORDER — PROPOFOL 10 MG/ML IV BOLUS
INTRAVENOUS | Status: AC
  Filled 2022-08-29: qty 20

## 2022-08-29 MED ORDER — FENTANYL CITRATE PF 50 MCG/ML IJ SOSY
PREFILLED_SYRINGE | INTRAMUSCULAR | Status: AC
  Administered 2022-08-29: 50 ug via INTRAVENOUS
  Filled 2022-08-29: qty 2

## 2022-08-29 MED ORDER — ONDANSETRON HCL 4 MG/2ML IJ SOLN
INTRAMUSCULAR | Status: AC
  Administered 2022-08-29: 2 mg
  Filled 2022-08-29: qty 2

## 2022-08-29 MED ORDER — CHLORHEXIDINE GLUCONATE 0.12 % MT SOLN: 15.0000 mL | Freq: Once | OROMUCOSAL | Status: DC

## 2022-08-29 MED ORDER — KETAMINE HCL 10 MG/ML IJ SOLN
INTRAMUSCULAR | Status: DC | PRN
  Administered 2022-08-29 (×2): 20 mg via INTRAVENOUS
  Administered 2022-08-29: 10 mg via INTRAVENOUS

## 2022-08-29 MED ORDER — DEXAMETHASONE SODIUM PHOSPHATE 10 MG/ML IJ SOLN
INTRAMUSCULAR | Status: AC
  Filled 2022-08-29: qty 1

## 2022-08-29 MED ORDER — ONDANSETRON HCL 4 MG/2ML IJ SOLN
INTRAMUSCULAR | Status: AC
  Filled 2022-08-29: qty 2

## 2022-08-29 MED ORDER — METHYLPREDNISOLONE SODIUM SUCC 40 MG IJ SOLR
40.0000 mg | Freq: Every day | INTRAMUSCULAR | Status: DC
  Administered 2022-08-29 – 2022-08-30 (×2): 40 mg via INTRAVENOUS
  Filled 2022-08-29 (×2): qty 1

## 2022-08-29 MED ORDER — MUPIROCIN 2 % EX OINT
1.0000 | TOPICAL_OINTMENT | Freq: Two times a day (BID) | CUTANEOUS | Status: AC
  Administered 2022-08-29 – 2022-09-02 (×9): 1 via NASAL
  Filled 2022-08-29 (×4): qty 22

## 2022-08-29 MED ORDER — ONDANSETRON HCL 4 MG/2ML IJ SOLN: 4.0000 mg | Freq: Once | INTRAMUSCULAR | Status: AC

## 2022-08-29 MED ORDER — PANTOPRAZOLE SODIUM 40 MG IV SOLR
40.0000 mg | INTRAVENOUS | Status: DC
  Administered 2022-08-29 – 2022-08-31 (×3): 40 mg via INTRAVENOUS
  Filled 2022-08-29 (×3): qty 10

## 2022-08-29 MED ORDER — PROPOFOL 500 MG/50ML IV EMUL: INTRAVENOUS | Status: DC | PRN

## 2022-08-29 MED ORDER — LACTATED RINGERS IR SOLN
Status: DC | PRN
  Administered 2022-08-29: 1

## 2022-08-29 MED ORDER — SODIUM CHLORIDE 0.9% FLUSH: 9.0000 mL | INTRAVENOUS | Status: DC | PRN

## 2022-08-29 MED ORDER — LIDOCAINE 2% (20 MG/ML) 5 ML SYRINGE
INTRAMUSCULAR | Status: DC | PRN
  Administered 2022-08-29: 60 mg via INTRAVENOUS

## 2022-08-29 MED ORDER — LACTATED RINGERS IV SOLN: INTRAVENOUS | Status: DC

## 2022-08-29 MED ORDER — ROCURONIUM BROMIDE 10 MG/ML (PF) SYRINGE
PREFILLED_SYRINGE | INTRAVENOUS | Status: AC
  Filled 2022-08-29: qty 10

## 2022-08-29 MED ORDER — ROCURONIUM BROMIDE 10 MG/ML (PF) SYRINGE
PREFILLED_SYRINGE | INTRAVENOUS | Status: DC | PRN
  Administered 2022-08-29: 60 mg via INTRAVENOUS
  Administered 2022-08-29: 30 mg via INTRAVENOUS
  Administered 2022-08-29: 40 mg via INTRAVENOUS
  Administered 2022-08-29: 20 mg via INTRAVENOUS

## 2022-08-29 MED ORDER — PHENYLEPHRINE 80 MCG/ML (10ML) SYRINGE FOR IV PUSH (FOR BLOOD PRESSURE SUPPORT)
PREFILLED_SYRINGE | INTRAVENOUS | Status: AC
  Filled 2022-08-29: qty 10

## 2022-08-29 MED ORDER — NALOXONE HCL 0.4 MG/ML IJ SOLN: 0.4000 mg | INTRAMUSCULAR | Status: DC | PRN

## 2022-08-29 MED ORDER — ALBUMIN HUMAN 5 % IV SOLN
INTRAVENOUS | Status: AC
  Filled 2022-08-29: qty 500

## 2022-08-29 MED ORDER — PROPOFOL 10 MG/ML IV BOLUS
INTRAVENOUS | Status: DC | PRN
  Administered 2022-08-29: 150 mg via INTRAVENOUS

## 2022-08-29 MED ORDER — ALBUMIN HUMAN 5 % IV SOLN: INTRAVENOUS | Status: DC | PRN

## 2022-08-29 MED ORDER — SCOPOLAMINE 1 MG/3DAYS TD PT72
MEDICATED_PATCH | TRANSDERMAL | Status: DC | PRN
  Administered 2022-08-29: 1 via TRANSDERMAL

## 2022-08-29 MED ORDER — DIPHENHYDRAMINE HCL 50 MG/ML IJ SOLN: 12.5000 mg | Freq: Four times a day (QID) | INTRAMUSCULAR | Status: DC | PRN

## 2022-08-29 MED ORDER — ONDANSETRON HCL 4 MG/2ML IJ SOLN
INTRAMUSCULAR | Status: AC
  Administered 2022-08-29: 4 mg via INTRAVENOUS
  Filled 2022-08-29: qty 2

## 2022-08-29 MED ORDER — FENTANYL 50 MCG/ML IV PCA SOLN
INTRAVENOUS | Status: DC
  Administered 2022-08-30: 165 ug via INTRAVENOUS
  Administered 2022-08-30: 75 ug via INTRAVENOUS
  Administered 2022-08-30: 1 ug via INTRAVENOUS
  Administered 2022-08-30: 165 ug via INTRAVENOUS
  Administered 2022-08-31 (×2): 30 ug via INTRAVENOUS
  Administered 2022-08-31: 15 ug via INTRAVENOUS
  Filled 2022-08-29 (×2): qty 25

## 2022-08-29 MED ORDER — EPHEDRINE SULFATE-NACL 50-0.9 MG/10ML-% IV SOSY
PREFILLED_SYRINGE | INTRAVENOUS | Status: DC | PRN
  Administered 2022-08-29: 10 mg via INTRAVENOUS
  Administered 2022-08-29: 5 mg via INTRAVENOUS

## 2022-08-29 MED ORDER — SUGAMMADEX SODIUM 200 MG/2ML IV SOLN
INTRAVENOUS | Status: DC | PRN
  Administered 2022-08-29: 400 mg via INTRAVENOUS

## 2022-08-29 MED ORDER — DIPHENHYDRAMINE HCL 12.5 MG/5ML PO ELIX: 12.5000 mg | ORAL_SOLUTION | Freq: Four times a day (QID) | ORAL | Status: DC | PRN

## 2022-08-29 MED ORDER — CHLORHEXIDINE GLUCONATE CLOTH 2 % EX PADS
6.0000 | MEDICATED_PAD | Freq: Every day | CUTANEOUS | Status: AC
  Administered 2022-08-29 – 2022-09-02 (×5): 6 via TOPICAL

## 2022-08-29 MED ORDER — ORAL CARE MOUTH RINSE: 15.0000 mL | Freq: Once | OROMUCOSAL | Status: DC

## 2022-08-29 MED ORDER — ONDANSETRON HCL 4 MG/2ML IJ SOLN: 4.0000 mg | Freq: Once | INTRAMUSCULAR | Status: AC | PRN

## 2022-08-29 MED ORDER — FENTANYL CITRATE (PF) 100 MCG/2ML IJ SOLN
INTRAMUSCULAR | Status: DC | PRN
  Administered 2022-08-29 (×5): 50 ug via INTRAVENOUS

## 2022-08-29 MED ORDER — MIDAZOLAM HCL 5 MG/5ML IJ SOLN
INTRAMUSCULAR | Status: DC | PRN
  Administered 2022-08-29: 2 mg via INTRAVENOUS

## 2022-08-29 MED ORDER — DEXAMETHASONE SODIUM PHOSPHATE 10 MG/ML IJ SOLN
INTRAMUSCULAR | Status: DC | PRN
  Administered 2022-08-29: 10 mg via INTRAVENOUS

## 2022-08-29 MED ORDER — ONDANSETRON HCL 4 MG/2ML IJ SOLN
4.0000 mg | Freq: Four times a day (QID) | INTRAMUSCULAR | Status: DC | PRN
  Administered 2022-08-29: 4 mg via INTRAVENOUS

## 2022-08-29 MED ORDER — FENTANYL CITRATE PF 50 MCG/ML IJ SOSY
PREFILLED_SYRINGE | INTRAMUSCULAR | Status: AC
  Administered 2022-08-29: 25 ug via INTRAVENOUS
  Filled 2022-08-29: qty 1

## 2022-08-29 MED ORDER — POVIDONE-IODINE 10 % EX OINT
TOPICAL_OINTMENT | CUTANEOUS | Status: AC
  Filled 2022-08-29: qty 28.35

## 2022-08-29 MED ORDER — PHENYLEPHRINE 80 MCG/ML (10ML) SYRINGE FOR IV PUSH (FOR BLOOD PRESSURE SUPPORT)
PREFILLED_SYRINGE | INTRAVENOUS | Status: DC | PRN
  Administered 2022-08-29 (×2): 160 ug via INTRAVENOUS
  Administered 2022-08-29: 80 ug via INTRAVENOUS
  Administered 2022-08-29 (×2): 160 ug via INTRAVENOUS
  Administered 2022-08-29: 80 ug via INTRAVENOUS

## 2022-08-29 MED ORDER — FENTANYL CITRATE PF 50 MCG/ML IJ SOSY
25.0000 ug | PREFILLED_SYRINGE | INTRAMUSCULAR | Status: DC | PRN
  Administered 2022-08-29: 50 ug via INTRAVENOUS
  Administered 2022-08-29: 25 ug via INTRAVENOUS

## 2022-08-29 MED ORDER — FENTANYL CITRATE (PF) 250 MCG/5ML IJ SOLN
INTRAMUSCULAR | Status: AC
  Filled 2022-08-29: qty 5

## 2022-08-29 MED ORDER — MIDAZOLAM HCL 2 MG/2ML IJ SOLN
INTRAMUSCULAR | Status: AC
  Filled 2022-08-29: qty 2

## 2022-08-29 MED ORDER — SCOPOLAMINE 1 MG/3DAYS TD PT72
MEDICATED_PATCH | TRANSDERMAL | Status: AC
  Filled 2022-08-29: qty 1

## 2022-08-29 MED ORDER — EPHEDRINE 5 MG/ML INJ
INTRAVENOUS | Status: AC
  Filled 2022-08-29: qty 5

## 2022-08-29 MED ORDER — SUCCINYLCHOLINE CHLORIDE 200 MG/10ML IV SOSY
PREFILLED_SYRINGE | INTRAVENOUS | Status: DC | PRN
  Administered 2022-08-29: 140 mg via INTRAVENOUS

## 2022-08-29 MED ORDER — OXYCODONE HCL 5 MG PO TABS
5.0000 mg | ORAL_TABLET | Freq: Once | ORAL | Status: AC | PRN
  Administered 2022-08-29: 5 mg via ORAL

## 2022-08-29 SURGICAL SUPPLY — 57 items
APL PRP STRL LF DISP 70% ISPRP (MISCELLANEOUS) ×1
APL SWBSTK 6 STRL LF DISP (MISCELLANEOUS) ×1
APPLICATOR COTTON TIP 6 STRL (MISCELLANEOUS) ×1 IMPLANT
APPLICATOR COTTON TIP 6IN STRL (MISCELLANEOUS) ×1 IMPLANT
BAG COUNTER SPONGE SURGICOUNT (BAG) IMPLANT
BAG SPNG CNTER NS LX DISP (BAG)
BLADE EXTENDED COATED 6.5IN (ELECTRODE) IMPLANT
BLADE HEX COATED 2.75 (ELECTRODE) ×1 IMPLANT
BNDG CMPR 82X61 PLY HI ABS (GAUZE/BANDAGES/DRESSINGS) ×1
BNDG CONFORM 6X.82 1P STRL (GAUZE/BANDAGES/DRESSINGS) IMPLANT
BNDG GAUZE DERMACEA FLUFF 4 (GAUZE/BANDAGES/DRESSINGS) IMPLANT
BNDG GZE DERMACEA 4 6PLY (GAUZE/BANDAGES/DRESSINGS) ×1
CATH ROBINSON RED A/P 14FR (CATHETERS) IMPLANT
CHLORAPREP W/TINT 26 (MISCELLANEOUS) ×1 IMPLANT
COVER MAYO STAND STRL (DRAPES) IMPLANT
COVER SURGICAL LIGHT HANDLE (MISCELLANEOUS) ×1 IMPLANT
DRAPE LAPAROSCOPIC ABDOMINAL (DRAPES) ×1 IMPLANT
DRAPE WARM FLUID 44X44 (DRAPES) IMPLANT
ELECT REM PT RETURN 15FT ADLT (MISCELLANEOUS) ×1 IMPLANT
GAUZE PAD ABD 8X10 STRL (GAUZE/BANDAGES/DRESSINGS) IMPLANT
GAUZE SPONGE 4X4 12PLY STRL (GAUZE/BANDAGES/DRESSINGS) ×1 IMPLANT
GLOVE BIO SURGEON STRL SZ7.5 (GLOVE) ×2 IMPLANT
GLOVE BIOGEL PI IND STRL 7.0 (GLOVE) ×1 IMPLANT
GOWN STRL REUS W/ TWL LRG LVL3 (GOWN DISPOSABLE) ×1 IMPLANT
GOWN STRL REUS W/ TWL XL LVL3 (GOWN DISPOSABLE) ×1 IMPLANT
GOWN STRL REUS W/TWL LRG LVL3 (GOWN DISPOSABLE) ×1
GOWN STRL REUS W/TWL XL LVL3 (GOWN DISPOSABLE) ×1
HANDLE SUCTION POOLE (INSTRUMENTS) IMPLANT
KIT BASIN OR (CUSTOM PROCEDURE TRAY) ×1 IMPLANT
KIT TURNOVER KIT A (KITS) IMPLANT
LIGASURE IMPACT 36 18CM CVD LR (INSTRUMENTS) IMPLANT
NS IRRIG 1000ML POUR BTL (IV SOLUTION) ×1 IMPLANT
PACK GENERAL/GYN (CUSTOM PROCEDURE TRAY) ×1 IMPLANT
RELOAD GRN CONTOUR (ENDOMECHANICALS) ×3 IMPLANT
RELOAD PROXIMATE 75MM BLUE (ENDOMECHANICALS) IMPLANT
RELOAD STAPLE 40 GRN THCK (ENDOMECHANICALS) IMPLANT
RELOAD STAPLE 75 3.8 BLU REG (ENDOMECHANICALS) IMPLANT
SPONGE T-LAP 18X18 ~~LOC~~+RFID (SPONGE) IMPLANT
STAPLER CVD CUT GN 40 RELOAD (ENDOMECHANICALS) ×1 IMPLANT
STAPLER CVD CUT GRN 40 RELOAD (ENDOMECHANICALS) IMPLANT
STAPLER PROXIMATE 75MM BLUE (STAPLE) ×1 IMPLANT
STAPLER VISISTAT 35W (STAPLE) ×1 IMPLANT
SUCTION POOLE HANDLE (INSTRUMENTS) ×1
SUT PDS AB 1 TP1 96 (SUTURE) IMPLANT
SUT PROLENE 2 0 SH DA (SUTURE) IMPLANT
SUT SILK 2 0 (SUTURE)
SUT SILK 2 0 SH CR/8 (SUTURE) IMPLANT
SUT SILK 2 0SH CR/8 30 (SUTURE) IMPLANT
SUT SILK 2-0 18XBRD TIE 12 (SUTURE) IMPLANT
SUT SILK 3 0 (SUTURE)
SUT SILK 3 0 SH CR/8 (SUTURE) IMPLANT
SUT SILK 3-0 18XBRD TIE 12 (SUTURE) IMPLANT
SUT VIC AB 3-0 SH 18 (SUTURE) IMPLANT
TOWEL OR 17X26 10 PK STRL BLUE (TOWEL DISPOSABLE) ×2 IMPLANT
TRAY FOLEY MTR SLVR 16FR STAT (SET/KITS/TRAYS/PACK) IMPLANT
WATER STERILE IRR 1000ML POUR (IV SOLUTION) ×1 IMPLANT
YANKAUER SUCT BULB TIP NO VENT (SUCTIONS) IMPLANT

## 2022-08-29 NOTE — Anesthesia Postprocedure Evaluation (Signed)
Anesthesia Post Note  Patient: Whitney Higgins  Procedure(s) Performed: EXPLORATORY LAPAROTOMY, EXTENDED LEFT COLECTOMY WITH MOBILIZATION SPLENIC FLEXURE, TRANSVERSE COLOSTOMY     Patient location during evaluation: PACU Anesthesia Type: General Level of consciousness: awake and alert Pain management: pain level controlled Vital Signs Assessment: post-procedure vital signs reviewed and stable Respiratory status: spontaneous breathing, nonlabored ventilation, respiratory function stable and patient connected to nasal cannula oxygen Cardiovascular status: blood pressure returned to baseline and stable Postop Assessment: no apparent nausea or vomiting Anesthetic complications: no   No notable events documented.  Last Vitals:  Vitals:   08/29/22 1515 08/29/22 1530  BP: 131/73 (!) 145/76  Pulse: 86 88  Resp: 15 19  Temp:    SpO2: 95% 95%                 Beryle Lathe

## 2022-08-29 NOTE — Progress Notes (Signed)
2 Days Post-Op   Subjective/Chief Complaint: Not feeling well today. nauseated   Objective: Vital signs in last 24 hours: Temp:  [97.7 F (36.5 C)-98.1 F (36.7 C)] 98.1 F (36.7 C) (04/16 0410) Pulse Rate:  [81-86] 81 (04/16 0410) Resp:  [17-19] 18 (04/16 0410) BP: (147-158)/(80-94) 157/81 (04/16 0410) SpO2:  [93 %-94 %] 93 % (04/16 0410) Last BM Date : 08/27/22  Intake/Output from previous day: 04/15 0701 - 04/16 0700 In: 382.4 [P.O.:120; I.V.:109.3; IV Piggyback:153.1] Out: -  Intake/Output this shift: No intake/output data recorded.  General appearance: alert and cooperative Resp: clear to auscultation bilaterally Cardio: regular rate and rhythm GI: soft, moderate RLQ tenderness  Lab Results:  Recent Labs    08/27/22 0315 08/29/22 0319  WBC 9.1 10.6*  HGB 11.9* 12.9  HCT 37.9 40.4  PLT 267 263   BMET Recent Labs    08/27/22 0315 08/29/22 0319  NA 137 137  K 4.5 3.6  CL 98 97*  CO2 29 28  GLUCOSE 83 102*  BUN 17 16  CREATININE 0.94 0.96  CALCIUM 8.8* 8.5*   PT/INR No results for input(s): "LABPROT", "INR" in the last 72 hours. ABG No results for input(s): "PHART", "HCO3" in the last 72 hours.  Invalid input(s): "PCO2", "PO2"  Studies/Results: DG Abd 2 Views  Result Date: 08/28/2022 CLINICAL DATA:  Assess stool burden.  Pain EXAM: ABDOMEN - 2 VIEW COMPARISON:  CT 08/25/2022 and older FINDINGS: There is presumed contrast scattered throughout the nondilated colon and some nondilated loops of small bowel with air as well. Mild colonic stool suggested. No obstruction. No definite free air on these supine radiographs. Surgical clips in the right upper quadrant. Right hip arthroplasty. Curvature and degenerative changes of the spine. IMPRESSION: Nonspecific bowel gas pattern with diffuse bowel contrast. Mild scattered colonic stool Electronically Signed   By: Karen Kays M.D.   On: 08/28/2022 12:58    Anti-infectives: Anti-infectives (From admission,  onward)    Start     Dose/Rate Route Frequency Ordered Stop   08/20/22 0900  piperacillin-tazobactam (ZOSYN) IVPB 3.375 g        3.375 g 12.5 mL/hr over 240 Minutes Intravenous Every 8 hours 08/20/22 0812     08/20/22 0845  piperacillin-tazobactam (ZOSYN) IVPB 3.375 g  Status:  Discontinued        3.375 g 100 mL/hr over 30 Minutes Intravenous Every 6 hours 08/20/22 0758 08/20/22 0812   08/17/22 1530  ciprofloxacin (CIPRO) IVPB 400 mg  Status:  Discontinued        400 mg 200 mL/hr over 60 Minutes Intravenous Every 12 hours 08/17/22 1453 08/20/22 0758   08/17/22 1430  metroNIDAZOLE (FLAGYL) IVPB 500 mg  Status:  Discontinued        500 mg 100 mL/hr over 60 Minutes Intravenous Every 12 hours 08/17/22 1428 08/20/22 0758   08/17/22 1430  ciprofloxacin (CIPRO) IVPB 400 mg  Status:  Discontinued        400 mg 200 mL/hr over 60 Minutes Intravenous  Once 08/17/22 1428 08/17/22 1453       Assessment/Plan: s/p Procedure(s): FLEXIBLE SIGMOIDOSCOPY (N/A) Plan for partial colectomy with colostomy today. Pt has been marked for ostomy. Risks and benefits of the surgery discussed with pt and she understands and wishes to proceed  LOS: 12 days    Chevis Pretty III 08/29/2022

## 2022-08-29 NOTE — Progress Notes (Addendum)
Patient ID: Whitney Higgins, female   DOB: 06/30/1955, 67 y.o.   MRN: [id number]    Progress Note   Subjective   Day #12 CC; history of Crohn's colitis/exacerbation of Crohn's colitis with phlegmon versus diverticulitis with phlegmon sigmoid colon  IV Zosyn P.o. prednisone  Labs-WBC 10.6/hemoglobin 12.9 Potassium 3.6/BUN 16/creatinine 0.96  Patient has just been taken down to surgery, spoke with patient's husband in room   Objective   Vital signs in last 24 hours: Temp:  [97.7 F (36.5 C)-98.1 F (36.7 C)] 98.1 F (36.7 C) (04/16 0410) Pulse Rate:  [81-86] 81 (04/16 0410) Resp:  [17-19] 18 (04/16 0410) BP: (147-158)/(80-94) 157/81 (04/16 0410) SpO2:  [93 %-94 %] 93 % (04/16 0410) Last BM Date : 08/27/22  Not examined today  Intake/Output from previous day: 04/15 0701 - 04/16 0700 In: 382.4 [P.O.:120; I.V.:109.3; IV Piggyback:153.1] Out: -  Intake/Output this shift: No intake/output data recorded.  Lab Results: Recent Labs    08/27/22 0315 08/29/22 0319  WBC 9.1 10.6*  HGB 11.9* 12.9  HCT 37.9 40.4  PLT 267 263   BMET Recent Labs    08/27/22 0315 08/29/22 0319  NA 137 137  K 4.5 3.6  CL 98 97*  CO2 29 28  GLUCOSE 83 102*  BUN 17 16  CREATININE 0.94 0.96  CALCIUM 8.8* 8.5*   LFT No results for input(s): "PROT", "ALBUMIN", "AST", "ALT", "ALKPHOS", "BILITOT", "BILIDIR", "IBILI" in the last 72 hours. PT/INR No results for input(s): "LABPROT", "INR" in the last 72 hours.  Studies/Results: DG Abd 2 Views  Result Date: 08/28/2022 CLINICAL DATA:  Assess stool burden.  Pain EXAM: ABDOMEN - 2 VIEW COMPARISON:  CT 08/25/2022 and older FINDINGS: There is presumed contrast scattered throughout the nondilated colon and some nondilated loops of small bowel with air as well. Mild colonic stool suggested. No obstruction. No definite free air on these supine radiographs. Surgical clips in the right upper quadrant. Right hip arthroplasty. Curvature and degenerative  changes of the spine. IMPRESSION: Nonspecific bowel gas pattern with diffuse bowel contrast. Mild scattered colonic stool Electronically Signed   By: Karen Kays M.D.   On: 08/28/2022 12:58       Assessment / Plan:    #33 67 year old white female with history of Crohn's colitis, previously managed with Lialda presenting with 79-month history of progressive symptoms and on admission imaging found to have a severe inflammatory process of the sigmoid colon-unclear whether this represented severe diverticulitis versus severe Crohn's colitis with both with associated phlegmon, possible early abscess and on most recent imaging has evidence of severe stricture of the sigmoid.  Flexible sigmoidoscopy aborted due to inability to prep  Unfortunately no significant progress despite 10 days of IV antibiotics and 7 days of IV steroids  Appreciate surgical consultation yesterday/Dr. Carolynne Edouard Plan is for surgery today with partial colectomy and probable colostomy.-Await findings at surgery GI will continue to follow peripherally     Principal Problem:   Acute diverticulitis Active Problems:   Crohn's colitis (HCC) - left-sided   Obesity, Class III, BMI 40-49.9 (morbid obesity)   Acquired hypothyroidism   Asthma   Chronic anxiety   Essential hypertension   Hypercholesterolemia   Major depressive disorder   Abnormal alkaline phosphatase test   Transaminitis   Abnormal CT scan, colon   Abdominal pain, left lower quadrant   RLQ abdominal pain   Abnormal abdominal CT scan     LOS: 12 days   Amy Esterwood PA-C 08/29/2022, 8:39 AM  GI ATTENDING  As above.  Look forward to surgical findings.  Wilhemina Bonito. Eda Keys., M.D. [facility] Division of Gastroenterology  ADDENDUM  Surgical findings noted. Await pathology. After surgical recovery, out patient follow up with Dr. Leone Payor, if needed. In the interim, please call for questions or assistance. Thanks .  Wilhemina Bonito. Eda Keys., M.D. [facility] Division of Gastroenterology

## 2022-08-29 NOTE — Progress Notes (Signed)
Patient denies CPAP use at home and declines use at this time. She expresses a lack of desire to attempt a try at CPAP while in this admission.

## 2022-08-29 NOTE — Op Note (Signed)
08/29/2022  2:27 PM  PATIENT:  Whitney Higgins  67 y.o. female  PRE-OPERATIVE DIAGNOSIS:  Exacerbation of colitis  POST-OPERATIVE DIAGNOSIS:  Exacerbation of colitis with probable stricture  PROCEDURE:  Procedure(s): EXPLORATORY LAPAROTOMY, EXTENDED LEFT COLECTOMY WITH MOBILIZATION SPLENIC FLEXURE, TRANSVERSE COLOSTOMY (N/A)  SURGEON:  Surgeon(s) and Role:    * Griselda Miner, MD - Primary    * White, Stephanie Coup, MD - Assisting  PHYSICIAN ASSISTANT:   ASSISTANTS: Dr. Cliffton Asters   ANESTHESIA:   general  EBL:  300 mL   BLOOD ADMINISTERED:none  DRAINS: none   LOCAL MEDICATIONS USED:  NONE  SPECIMEN:  Source of Specimen:  extended left colectomy  DISPOSITION OF SPECIMEN:  PATHOLOGY  COUNTS:  YES  TOURNIQUET:  * No tourniquets in log *  DICTATION: .Dragon Dictation  After informed consent was obtained the patient was brought to the operating room and placed in the supine position on the operating table.  After adequate induction of general anesthesia the patient's abdomen was prepped with ChloraPrep, allowed to dry, and draped in usual sterile manner.  An appropriate timeout was performed.  A midline incision was then made with a 10 blade knife.  The incision was carried through the skin and subcutaneous tissue sharply with the electrocautery until the linea alba was identified.  The linea alba was also incised with the electrocautery.  The preperitoneal space was probed bluntly with a hemostat until the peritoneum was opened and access was gained to the abdominal cavity.  The rest of the incision was opened under direct vision.  There was some omentum adherent to the anterior abdominal wall which was taken down sharply with the electrocautery.  The colon was noted to be diffusely dilated.  I was able to reach down into the pelvis and identify an area at the rectosigmoid junction significant induration and narrowing.  A Bookwalter retractor was deployed.  I was then able to begin  mobilizing the left colon by incising its retroperitoneal attachment along the white line of Toldt.  I chose a site above the area of narrowing for division of the sigmoid colon.  The mesentery at this point was opened sharply with the electrocautery.  The sigmoid colon was divided with a single firing of a contour green load stapler.  We were then able to take down the mesentery to the rectosigmoid sharply with the LigaSure as well as with some blunt finger dissection.  The mesentery was quite foreshortened.  We kept the dissection adjacent to the wall of the rectosigmoid to avoid any injury to the ureters.  Once we were below the area of inflammation and narrowing we were able to then divide the rectosigmoid with a single firing of a contour green load stapler.  The rectosigmoid colon was then removed and marked with a stitch on the proximal staple line.  A couple of small bleeding vessels along the mesentery were controlled with 2-0 silk figure-of-eight stitches.  Next the left colon was further mobilized and rolled medially.  We did have to mobilize the splenic flexure during this process as well.  This was done with some blunt finger dissection and some sharp dissection with the LigaSure and Bovie.  Once the splenic flexure was up we then chose a site on the mid transverse colon to bring out the ostomy.  The mesentery at this point was opened sharply with the electrocautery.  The transverse colon was then divided with a single firing of contour green load stapler.  The mesentery  to the distal transverse colon was then taken down sharply also with the LigaSure.  Once this was accomplished then the distal transverse and splenic flexure and remaining left colon were removed from the patient and sent to pathology.  Hemostasis was achieved using the Bovie electrocautery.  The wound was then irrigated with copious amounts of saline.  We did have to open the colon and evacuate some of the air and stool in order to be  able to finish mobilizing the colon because of the amount of distention.  The patient had been previously marked for an ostomy.  A circular portion of skin and subcutaneous fat was then removed sharply with the electrocautery at the site.  A cruciate incision was made in the fascia of the abdominal wall.  A Babcock was placed through the abdominal wall and used to grasp the staple line of the distal transverse colon.  The colon was then brought through the abdominal wall without difficulty.  The colon appeared to be healthy.  At this point the fascia of the anterior abdominal wall was closed with 2 running #1 double-stranded looped PDS sutures.  The midline wound was then covered with a green towel.  The staple line of the ostomy was then removed and the ostomy was matured with interrupted 3-0 Vicryl stitches.  An ostomy appliance was applied.  The midline incision was then packed with moistened Kerlix gauze and sterile dressings were applied.  The patient tolerated the procedure well.  At the end of the case all needle sponge and instrument counts were correct.  The patient was then awakened and taken to recovery in stable condition.  The assistant was instrumental in crucial for completion of the case.  PLAN OF CARE: Admit to inpatient   PATIENT DISPOSITION:  PACU - hemodynamically stable.   Delay start of Pharmacological VTE agent (>24hrs) due to surgical blood loss or risk of bleeding: no

## 2022-08-29 NOTE — Consult Note (Signed)
WOC Nurse requested for preoperative stoma site marking  Discussed surgical procedure and stoma creation with patient.  Explained role of the WOC nurse team. Provided the patient with educational booklet and provided samples of pouching options.  Answered patient's questions.   Examined patient lying and sitting upright, in order to place the marking in the patient's visual field, away from any creases or abdominal contour issues and within the rectus muscle.  Attempted to mark below the patient's belt line, but this was not possible, since there are 2 significant folds located lower on the abd which  would make the ostomy pouch disappear when sitting or leaning forward and should be avoided if possible.  For this reason, I have marked much higher then usual.   Marked for colostomy in the LLQ  _8___ cm to the left of the umbilicus and __8__cm above the umbilicus.  Marked for ileostomy in the RLQ  __8__cm to the right of the umbilicus and  __8__ cm above the umbilicus. Patient's abdomen cleansed with CHG wipes at site markings, allowed to air dry prior to marking. Pt plans for surgery today.  WOC Nurse team will follow up with patient after surgery for continued ostomy care and teaching.  Thank-you,  Cammie Mcgee MSN, RN, CWOCN, [place], CNS [phone]

## 2022-08-29 NOTE — Transfer of Care (Signed)
Immediate Anesthesia Transfer of Care Note  Patient: Whitney Higgins  Procedure(s) Performed: EXPLORATORY LAPAROTOMY, EXTENDED LEFT COLECTOMY WITH MOBILIZATION SPLENIC FLEXURE, TRANSVERSE COLOSTOMY  Patient Location: PACU  Anesthesia Type:General  Level of Consciousness: awake, alert , and oriented  Airway & Oxygen Therapy: Patient Spontanous Breathing and Patient connected to face mask oxygen  Post-op Assessment: Report given to RN and Post -op Vital signs reviewed and stable  Post vital signs: Reviewed and stable  Last Vitals:  Vitals Value Taken Time  BP 146/72 08/29/22 1433  Temp    Pulse 81 08/29/22 1436  Resp 20 08/29/22 1436  SpO2 100 % 08/29/22 1436  Vitals shown include unvalidated device data.  Last Pain:  Vitals:   08/29/22 0739  TempSrc:   PainSc: 8       Patients Stated Pain Goal: 4 (08/29/22 0530)  Complications: No notable events documented.

## 2022-08-29 NOTE — Anesthesia Preprocedure Evaluation (Addendum)
Anesthesia Evaluation  Patient identified by MRN, date of birth, ID band Patient awake    Reviewed: Allergy & Precautions, NPO status , Patient's Chart, lab work & pertinent test results  History of Anesthesia Complications (+) PONV and history of anesthetic complications  Airway Mallampati: II  TM Distance: >3 FB Neck ROM: Full    Dental  (+) Dental Advisory Given   Pulmonary asthma    Pulmonary exam normal        Cardiovascular hypertension, Pt. on medications + CAD  Normal cardiovascular exam     Neuro/Psych  Headaches PSYCHIATRIC DISORDERS Anxiety Depression       GI/Hepatic Neg liver ROS,GERD  Controlled,,  Endo/Other  Hypothyroidism  Morbid obesity Pituitary tumor s/p excision   Renal/GU  Partial nephrectomy      Musculoskeletal  (+) Arthritis ,    Abdominal  (+) + obese  Peds  Hematology negative hematology ROS (+)   Anesthesia Other Findings   Reproductive/Obstetrics                             Anesthesia Physical Anesthesia Plan  ASA: 3 and emergent  Anesthesia Plan: General   Post-op Pain Management: Ofirmev IV (intra-op)* and Ketamine IV*   Induction: Intravenous, Cricoid pressure planned and Rapid sequence  PONV Risk Score and Plan: 4 or greater and Treatment may vary due to age or medical condition, Ondansetron, Dexamethasone and Midazolam  Airway Management Planned: Oral ETT  Additional Equipment: None  Intra-op Plan:   Post-operative Plan: Extubation in OR  Informed Consent: I have reviewed the patients History and Physical, chart, labs and discussed the procedure including the risks, benefits and alternatives for the proposed anesthesia with the patient or authorized representative who has indicated his/her understanding and acceptance.     Dental advisory given  Plan Discussed with: CRNA and Anesthesiologist  Anesthesia Plan Comments:         Anesthesia Quick Evaluation

## 2022-08-29 NOTE — Anesthesia Procedure Notes (Signed)
Procedure Name: Intubation Date/Time: 08/29/2022 11:31 AM  Performed by: Orest Dikes, CRNAPre-anesthesia Checklist: Patient identified, Emergency Drugs available, Suction available and Patient being monitored Patient Re-evaluated:Patient Re-evaluated prior to induction Oxygen Delivery Method: Circle system utilized Preoxygenation: Pre-oxygenation with 100% oxygen Induction Type: IV induction, Rapid sequence and Cricoid Pressure applied Laryngoscope Size: Mac and 4 Grade View: Grade I Tube type: Oral Tube size: 7.0 mm Number of attempts: 1 Airway Equipment and Method: Stylet Placement Confirmation: ETT inserted through vocal cords under direct vision, positive ETCO2 and breath sounds checked- equal and bilateral Secured at: 21 cm Tube secured with: Tape Dental Injury: Teeth and Oropharynx as per pre-operative assessment

## 2022-08-29 NOTE — Progress Notes (Signed)
PROGRESS NOTE    Whitney Higgins  ZOX:[id number]  DOB: 06-21-1955  DOA: 08/17/2022 PCP: Montez Hageman, DO Outpatient Specialists:   Hospital course:  67 year old white female with history of Crohn's colitis, previously managed with Lialda presenting with 54-month history of progressive symptoms and on admission imaging found to have a severe inflammatory process of the sigmoid colon-unclear whether this represented severe diverticulitis versus severe Crohn's colitis with both with associated phlegmon, possible early abscess and on most recent imaging has evidence of severe stricture of the sigmoid.   Subjective:  Patient seen in PACU, she is somnolent and groggy.   Objective: Vitals:   08/29/22 1724 08/29/22 1750 08/29/22 1858 08/29/22 1921  BP:  135/74 (!) 142/82   Pulse:  94 95   Resp: Temp:  97.8 F (36.6 C) 97.6 F (36.4 C)   TempSrc:  Oral Oral   SpO2: 100% 98% 94% 95%  Weight:      Height:        Intake/Output Summary (Last 24 hours) at 08/29/2022 1945 Last data filed at 08/29/2022 1805 Gross per 24 hour  Intake 2906.52 ml  Output 805 ml  Net 2101.52 ml   Filed Weights   08/17/22 1744  Weight: 122.5 kg     Exam:  General: Patient sleeping comfortably in NARD  Eyes: sclera anicteric, conjuctiva mild injection bilaterally CVS: S1-S2, regular  Respiratory:  decreased air entry bilaterally secondary to decreased inspiratory effort, rales at bases  GI: Not examined given she just had colectomy with diverting colostomy LE: No edema Neuro: A/O x 3,  grossly nonfocal.  Psych: patient is logical and coherent, judgement and insight appear normal, mood and affect appropriate to situation.  Data Reviewed:  Basic Metabolic Panel: Recent Labs  Lab 08/23/22 0332 08/24/22 0324 08/25/22 0341 08/27/22 0315 08/29/22 0319  NA 136 137 137 137 137  K 3.4* 3.5 3.9 4.5 3.6  CL 102 101 97* 98 97*  CO2 GLUCOSE 98 92 92 83 102*  BUN CREATININE 0.81 0.84 0.97 0.94 0.96  CALCIUM 8.2* 8.4* 8.5* 8.8* 8.5*  MG  --  2.2  --   --   --     CBC: Recent Labs  Lab 08/23/22 0333 08/25/22 0341 08/27/22 0315 08/29/22 0319  WBC 10.7* 8.4 9.1 10.6*  NEUTROABS 8.9*  --   --   --   HGB 10.3* 11.7* 11.9* 12.9  HCT 32.9* 37.3 37.9 40.4  MCV 88.7 89.0 88.1 88.6  PLT 278 294 267 263     Scheduled Meds:  amLODipine  5 mg Oral Daily   Chlorhexidine Gluconate Cloth  6 each Topical Q0600   fentaNYL   Intravenous Q4H   insulin aspart  0-15 Units Subcutaneous TID WC   insulin aspart  0-5 Units Subcutaneous QHS   insulin aspart  3 Units Subcutaneous TID WC   levothyroxine  75 mcg Oral QAC breakfast   methylPREDNISolone (SOLU-MEDROL) injection  40 mg Intravenous Daily   montelukast  10 mg Oral QHS   mupirocin ointment  1 Application Nasal BID   pantoprazole (PROTONIX) IV  40 mg Intravenous Q24H   sodium chloride flush  10-40 mL Intracatheter Q12H   venlafaxine XR  150 mg Oral BID   Continuous Infusions:  sodium chloride Stopped (08/28/22 1407)   lactated ringers     lactated ringers 100 mL/hr at 08/29/22 1712   piperacillin-tazobactam (  ZOSYN)  IV 3.375 g (08/29/22 0545)     Assessment & Plan:   Acute diverticulitis with phlegmon Crohn's colitis with phlegmon Patient in PACU having just undergone left colectomy with transverse colostomy Patient remains on Solu-Medrol presently however this can likely be discontinued per GI recommendations She is also on Zosyn however this may also likely be able to be discontinued given colectomy  Hyperglycemia No previous history of DM 2, likely secondary to steroid use Hemoglobin A1c is 6.4 Continue SSI ACHS  Anxiety and depression Continue venlafaxine  HTN Continue amlodipine  Hypothyroid Continue Lovenox  Asthma No evidence for acute flare at [place]    DVT prophylaxis: Lovenox to be restarted per general surgery Code Status: Full Family  Communication: Spoke briefly with patient's husband who was in the room waiting for her to come back     Studies: DG Abd 2 Views  Result Date: 08/28/2022 CLINICAL DATA:  Assess stool burden.  Pain EXAM: ABDOMEN - 2 VIEW COMPARISON:  CT 08/25/2022 and older FINDINGS: There is presumed contrast scattered throughout the nondilated colon and some nondilated loops of small bowel with air as well. Mild colonic stool suggested. No obstruction. No definite free air on these supine radiographs. Surgical clips in the right upper quadrant. Right hip arthroplasty. Curvature and degenerative changes of the spine. IMPRESSION: Nonspecific bowel gas pattern with diffuse bowel contrast. Mild scattered colonic stool Electronically Signed   By: Karen Kays M.D.   On: 08/28/2022 12:58    Principal Problem:   Acute diverticulitis Active Problems:   Crohn's colitis (HCC) - left-sided   Obesity, Class III, BMI 40-49.9 (morbid obesity)   Acquired hypothyroidism   Asthma   Chronic anxiety   Essential hypertension   Hypercholesterolemia   Major depressive disorder   Abnormal alkaline phosphatase test   Transaminitis   Abnormal CT scan, colon   Abdominal pain, left lower quadrant   RLQ abdominal pain   Abnormal abdominal CT scan     Pieter Partridge, [facility]  If 7PM-7AM, please contact night-coverage [email or web address]   LOS: 12 days

## 2022-08-30 ENCOUNTER — Encounter (HOSPITAL_COMMUNITY): Payer: Self-pay | Admitting: General Surgery

## 2022-08-30 DIAGNOSIS — K5792 Diverticulitis of intestine, part unspecified, without perforation or abscess without bleeding: Secondary | ICD-10-CM | POA: Diagnosis not present

## 2022-08-30 LAB — COMPREHENSIVE METABOLIC PANEL
ALT: 43 U/L (ref 0–44)
AST: 31 U/L (ref 15–41)
Albumin: 3 g/dL — ABNORMAL LOW (ref 3.5–5.0)
Alkaline Phosphatase: 59 U/L (ref 38–126)
Anion gap: 8 (ref 5–15)
BUN: 18 mg/dL (ref 8–23)
CO2: 29 mmol/L (ref 22–32)
Calcium: 8.3 mg/dL — ABNORMAL LOW (ref 8.9–10.3)
Chloride: 100 mmol/L (ref 98–111)
Creatinine, Ser: 0.89 mg/dL (ref 0.44–1.00)
GFR, Estimated: >60 mL/min (ref >60)
Glucose, Bld: 153 mg/dL — ABNORMAL HIGH (ref 70–99)
Potassium: 3.8 mmol/L (ref 3.5–5.1)
Sodium: 137 mmol/L (ref 135–145)
Total Bilirubin: 0.8 mg/dL (ref 0.3–1.2)
Total Protein: 6.1 g/dL — ABNORMAL LOW (ref 6.5–8.1)

## 2022-08-30 LAB — CBC
HCT: 34.9 % — ABNORMAL LOW (ref 36.0–46.0)
Hemoglobin: 10.9 g/dL — ABNORMAL LOW (ref 12.0–15.0)
MCH: 28.6 pg (ref 26.0–34.0)
MCHC: 31.2 g/dL (ref 30.0–36.0)
MCV: 91.6 fL (ref 80.0–100.0)
Platelets: 213 10*3/uL (ref 150–400)
RBC: 3.81 MIL/uL — ABNORMAL LOW (ref 3.87–5.11)
RDW: 16.7 % — ABNORMAL HIGH (ref 11.5–15.5)
WBC: 15 10*3/uL — ABNORMAL HIGH (ref 4.0–10.5)
nRBC: 0 % (ref 0.0–0.2)

## 2022-08-30 LAB — GLUCOSE, CAPILLARY
Glucose-Capillary: 119 mg/dL — ABNORMAL HIGH (ref 70–99)
Glucose-Capillary: 133 mg/dL — ABNORMAL HIGH (ref 70–99)
Glucose-Capillary: 136 mg/dL — ABNORMAL HIGH (ref 70–99)
Glucose-Capillary: 105 mg/dL — ABNORMAL HIGH (ref 70–99)

## 2022-08-30 MED ORDER — DOCUSATE SODIUM 100 MG PO CAPS
100.0000 mg | ORAL_CAPSULE | Freq: Two times a day (BID) | ORAL | Status: DC
  Administered 2022-08-30 – 2022-09-04 (×10): 100 mg via ORAL
  Filled 2022-08-30 (×11): qty 1

## 2022-08-30 NOTE — Progress Notes (Signed)
PROGRESS NOTE    Whitney Higgins  ZOX:[id number] DOB: 01-11-1956 DOA: 08/17/2022 PCP: Montez Hageman, DO   Brief Narrative: 67 year old white female with history of Crohn's colitis, previously managed with Lialda presenting with 74-month history of progressive symptoms and on admission imaging found to have a severe inflammatory process of the sigmoid colon-unclear whether this represented severe diverticulitis versus severe Crohn's colitis with both with associated phlegmon, possible early abscess and on most recent imaging has evidence of severe stricture of the sigmoid.   Assessment & Plan:   Principal Problem:   Acute diverticulitis Active Problems:   Crohn's colitis (HCC) - left-sided   Obesity, Class III, BMI 40-49.9 (morbid obesity)   Acquired hypothyroidism   Asthma   Chronic anxiety   Essential hypertension   Hypercholesterolemia   Major depressive disorder   Abnormal alkaline phosphatase test   Transaminitis   Abnormal CT scan, colon   Abdominal pain, left lower quadrant   RLQ abdominal pain   Abnormal abdominal CT scan    Acute diverticulitis with phlegmon Crohn's colitis with phlegmon  S/P left colectomy with transverse colostomy 4/16 Solumedrol stopped 4/16 On zosyn  Wbc 15. Oob ambulate PT eval   Hyperglycemia-CBG (last 3)  Recent Labs    08/29/22 2147 08/30/22 0750 08/30/22 1153  GLUCAP 193* 119* 133*    No previous history of DM 2, likely secondary to steroid use Hemoglobin A1c is 6.4 Continue SSI ACHS   Anxiety and depression Continue venlafaxine   HTN Continue amlodipine   Hypothyroid Continue synthroid   Asthma-encourage incentive spirometry  Acute blood loss anemia -hemoglobin 10.9 from 12.9 yesterday Follow-up CBC in a.m.  Estimated body mass index is 46.37 kg/m as calculated from the following:   Height as of this encounter:  (1.626 m).   Weight as of this encounter: 122.5 kg.  DVT prophylaxis: Lovenox Code Status: Full  code Family Communication: Husband at bedside Disposition Plan:  Status is: Inpatient Remains inpatient appropriate because: Acute diverticulitis status post colectomy   Consultants:  General surgery during GI  Procedures: Exploratory laparotomy left colectomy transverse colostomy 08/29/2022 Antimicrobials: Zosyn  Subjective: Husband reports that patient had a rough night patient reports she has used the PCA pump overnight feels better this morning No bowel movements or passed gas or gas in the colostomy bag noted Sat up at the side of the bed has not walked yet WBC 15 hemoglobin 10.9 Objective: Vitals:   08/29/22 2357 08/29/22 2357 08/30/22 0308 08/30/22 0941  BP:  (!) 156/89  (!) 168/71  Pulse:  95  85  Resp: Temp:  98.6 F (37 C)  97.6 F (36.4 C)  TempSrc:    Oral  SpO2: 92% 94% 91% 95%  Weight:      Height:        Intake/Output Summary (Last 24 hours) at 08/30/2022 1128 Last data filed at 08/30/2022 1000 Gross per 24 hour  Intake 3464.53 ml  Output 1435 ml  Net 2029.53 ml   Filed Weights   08/17/22 1744  Weight: 122.5 kg    Examination:  General exam: Appears in no acute distress Respiratory system: Clear to auscultation. Respiratory effort normal. Cardiovascular system: S1 & S2 heard, RRR. No JVD, murmurs, rubs, gallops or clicks. No pedal edema. Gastrointestinal system: Incision forward with dressing  Colostomy with no stool or air  Central nervous system: Alert and oriented. No focal neurological deficits. Extremities: Symmetric 5 x 5 power. Skin: No rashes, lesions  or ulcers Psychiatry: Judgement and insight appear normal. Mood & affect appropriate.   Data Reviewed: I have personally reviewed following labs and imaging studies  CBC: Recent Labs  Lab 08/25/22 0341 08/27/22 0315 08/29/22 0319 08/30/22 0445  WBC 8.4 9.1 10.6* 15.0*  HGB 11.7* 11.9* 12.9 10.9*  HCT 37.3 37.9 40.4 34.9*  MCV 89.0 88.1 88.6 91.6  PLT 294 267 263 213    Basic Metabolic Panel: Recent Labs  Lab 08/24/22 0324 08/25/22 0341 08/27/22 0315 08/29/22 0319 08/30/22 0445  NA 137 137 137 137 137  K 3.5 3.9 4.5 3.6 3.8  CL 101 97* 98 97* 100  CO2 GLUCOSE 92 92 83 102* 153*  BUN CREATININE 0.84 0.97 0.94 0.96 0.89  CALCIUM 8.4* 8.5* 8.8* 8.5* 8.3*  MG 2.2  --   --   --   --    GFR: Estimated Creatinine Clearance: 78.1 mL/min (by C-G formula based on SCr of 0.89 mg/dL). Liver Function Tests: Recent Labs  Lab 08/30/22 0445  AST 31  ALT 43  ALKPHOS 59  BILITOT 0.8  PROT 6.1*  ALBUMIN 3.0*   No results for input(s): "LIPASE", "AMYLASE" in the last 168 hours. No results for input(s): "AMMONIA" in the last 168 hours. Coagulation Profile: No results for input(s): "INR", "PROTIME" in the last 168 hours. Cardiac Enzymes: No results for input(s): "CKTOTAL", "CKMB", "CKMBINDEX", "TROPONINI" in the last 168 hours. BNP (last 3 results) No results for input(s): "PROBNP" in the last 8760 hours. HbA1C: No results for input(s): "HGBA1C" in the last 72 hours. CBG: Recent Labs  Lab 08/28/22 2116 08/29/22 0738 08/29/22 1745 08/29/22 2147 08/30/22 0750  GLUCAP 116* 110* 199* 193* 119*   Lipid Profile: No results for input(s): "CHOL", "HDL", "LDLCALC", "TRIG", "CHOLHDL", "LDLDIRECT" in the last 72 hours. Thyroid Function Tests: No results for input(s): "TSH", "T4TOTAL", "FREET4", "T3FREE", "THYROIDAB" in the last 72 hours. Anemia Panel: No results for input(s): "VITAMINB12", "FOLATE", "FERRITIN", "TIBC", "IRON", "RETICCTPCT" in the last 72 hours. Sepsis Labs: No results for input(s): "PROCALCITON", "LATICACIDVEN" in the last 168 hours.  Recent Results (from the past 240 hour(s))  Surgical PCR screen     Status: Abnormal   Collection Time: 08/29/22  2:22 AM   Specimen: Nasal Mucosa; Nasal Swab  Result Value Ref Range Status   MRSA, PCR NEGATIVE NEGATIVE Final   Staphylococcus aureus POSITIVE (A)  NEGATIVE Final    Comment: (NOTE) The Xpert SA Assay (FDA approved for NASAL specimens in patients 54 years of age and older), is one component of a comprehensive surveillance program. It is not intended to diagnose infection nor to guide or monitor treatment. Performed at [facility], [address] [place], [place] [address]          Radiology Studies: No results found.      Scheduled Meds:  amLODipine  5 mg Oral Daily   Chlorhexidine Gluconate Cloth  6 each Topical Q0600   docusate sodium  100 mg Oral BID   fentaNYL   Intravenous Q4H   insulin aspart  0-15 Units Subcutaneous TID WC   insulin aspart  0-5 Units Subcutaneous QHS   insulin aspart  3 Units Subcutaneous TID WC   levothyroxine  75 mcg Oral QAC breakfast   montelukast  10 mg Oral QHS   mupirocin ointment  1 Application Nasal BID   pantoprazole (PROTONIX) IV  40 mg Intravenous Q24H  sodium chloride flush  10-40 mL Intracatheter Q12H   venlafaxine XR  150 mg Oral BID   Continuous Infusions:  sodium chloride Stopped (08/28/22 1407)   lactated ringers 100 mL/hr at 08/30/22 0243   piperacillin-tazobactam (ZOSYN)  IV 3.375 g (08/30/22 0626)     LOS: 13 days    Time spent: 39 min  Alwyn Ren, MD  08/30/2022, 11:28 AM

## 2022-08-30 NOTE — Evaluation (Signed)
Physical Therapy Evaluation Patient Details Name: Whitney Higgins MRN: [id number] DOB: [birth year] Today's Date: 08/30/2022  History of Present Illness  67 yo female referred to ED on 08/17/2022 from OP GI due to abdominal pain with some nausea, episodes of emesis and diarrhea over the past 2 months and abn abdominal CT. Pt has PMH including but not limited to: anxiety, depression, OA, asthma, chronic LBP s/p lumbar laminectomy/decompression, gallstones, pituitary tumor, HTN, HDL, diverticulitis, Chohns colitis and IBS  Pt underwent L colectomy with transverse colostomy on 4/16 and wound vac  and now has orders for acute therapy intervention.  Clinical Impression    Pt admitted with above diagnosis.  Pt currently with functional limitations due to the deficits listed below (see PT Problem List). Pt in bed when PT arrived, pt agreeable to therapy intervention and stated she was able to sit EOB with nursing staff. Pt required cues for log roll and guarding abdomen for supine to sit with mod A, mod A for STS from EOB, trials with use of RW and pt guarding abdomen with UE support and transitioned to [facility] and cues. Pt required total A to manage lines and leads, occasional cues for coordinated breathing strategies and slow and controlled RR. Pt left seated in recliner, all needs met and NT present. Pt will benefit from acute skilled PT to increase their independence and safety with mobility to allow discharge.        Recommendations for follow up therapy are one component of a multi-disciplinary discharge planning process, led by the attending physician.  Recommendations may be updated based on patient status, additional functional criteria and insurance authorization.  Follow Up Recommendations       Assistance Recommended at Discharge Frequent or constant Supervision/Assistance  Patient can return home with the following  A lot of help with walking and/or transfers;Two people to help with  bathing/dressing/bathroom;Assistance with cooking/housework;Assist for transportation;Help with stairs or ramp for entrance    Equipment Recommendations None recommended by PT (DME to be determined as pt progresses)  Recommendations for Other Services       Functional Status Assessment Patient has had a recent decline in their functional status and demonstrates the ability to make significant improvements in function in a reasonable and predictable amount of time.     Precautions / Restrictions Precautions Precautions: Fall Restrictions Weight Bearing Restrictions: No      Mobility  Bed Mobility Overal bed mobility: Needs Assistance Bed Mobility: Supine to Sit     Supine to sit: Mod assist     General bed mobility comments: HOB slightly elevated, instruction provided on log roll and use of bed rails    Transfers Overall transfer level: Needs assistance Equipment used: Rolling walker (2 wheels) Transfers: Bed to chair/wheelchair/BSC   Stand pivot transfers: Mod assist         General transfer comment: trials with use of RW however due to pt abdominal guard unable to maintain B UEs on RW, increased ease with HHA    Ambulation/Gait               General Gait Details: NT due to pt pain report and generalized weakness and fatigue  Stairs            Wheelchair Mobility    Modified Rankin (Stroke Patients Only)       Balance Overall balance assessment: Needs assistance Sitting-balance support: Feet supported Sitting balance-Leahy Scale: Fair     Standing balance support: Single extremity supported, During functional  activity, Reliant on assistive device for balance Standing balance-Leahy Scale: Poor                               Pertinent Vitals/Pain Pain Assessment Pain Assessment: 0-10 Pain Score: 6  Pain Location: abdomen Pain Descriptors / Indicators: Constant, Grimacing, Guarding, Heaviness Pain Intervention(s): Limited  activity within patient's tolerance, Monitored during session, Premedicated before session, Repositioned, PCA encouraged    Home Living Family/patient expects to be discharged to:: Private residence Living Arrangements: Spouse/significant other Available Help at Discharge: Family Type of Home: House Home Access: Level entry       Home Layout: One level Home Equipment: Cane - single point      Prior Function Prior Level of Function : Independent/Modified Independent;Driving             Mobility Comments: mod I at [facility] level due to knee instability associated with pt reported miniscal tear and sx pending. mod I with all ADLs, self care tasks, IADLs (limited community navigation) and driving       Hand Dominance        Extremity/Trunk Assessment        Lower Extremity Assessment Lower Extremity Assessment: Generalized weakness    Cervical / Trunk Assessment Cervical / Trunk Assessment: Normal  Communication   Communication: No difficulties  Cognition Arousal/Alertness: Awake/alert Behavior During Therapy: WFL for tasks assessed/performed Overall Cognitive Status: Within Functional Limits for tasks assessed                                          General Comments      Exercises     Assessment/Plan    PT Assessment Patient needs continued PT services  PT Problem List Decreased strength;Decreased activity tolerance;Decreased balance;Decreased mobility;Pain       PT Treatment Interventions DME instruction;Gait training;Functional mobility training;Therapeutic activities;Therapeutic exercise;Balance training;Neuromuscular re-education;Patient/family education;Modalities    PT Goals (Current goals can be found in the Care Plan section)  Acute Rehab PT Goals Patient Stated Goal: STG: to be able to use the bathroom; LTG be able to attend grandchildrens sporting events PT Goal Formulation: With patient Time For Goal Achievement:  09/20/22 Potential to Achieve Goals: Good    Frequency Min 1X/week     Co-evaluation               AM-PAC PT "6 Clicks" Mobility  Outcome Measure Help needed turning from your back to your side while in a flat bed without using bedrails?: A Lot Help needed moving from lying on your back to sitting on the side of a flat bed without using bedrails?: A Lot Help needed moving to and from a bed to a chair (including a wheelchair)?: A Lot Help needed standing up from a chair using your arms (e.g., wheelchair or bedside chair)?: A Lot Help needed to walk in hospital room?: Total Help needed climbing 3-5 steps with a railing? : Total 6 Click Score: 10    End of Session   Activity Tolerance: Patient limited by fatigue;Patient limited by pain Patient left: in chair;with call bell/phone within reach;with nursing/sitter in room Nurse Communication: Mobility status;Other (comment) (pt requesting soda) PT Visit Diagnosis: Unsteadiness on feet (R26.81);Muscle weakness (generalized) (M62.81);Pain    Time: 1131-1203 PT Time Calculation (min) (ACUTE ONLY): 32 min   Charges:   PT Evaluation $  PT Eval Moderate Complexity: 1 Mod PT Treatments $Therapeutic Activity: 8-22 mins        Rica Mote, PT   Jacqualyn Posey 08/30/2022, 12:31 PM

## 2022-08-30 NOTE — Progress Notes (Signed)
Patient continues to refuses nocturnal CPAP

## 2022-08-30 NOTE — Consult Note (Addendum)
WOC Nurse wound follow up Surgical team following for assessment and plan of care for abd wound.  Requested to apply Vac to abd full thickness post-op midline wound.  Pt was medicated for pain prior to the procedure and tolerated with minimal amt discomfort. 2 nursing students assisted with dressing change. Wound type: Full thickness wound is beefy red, 17X4X5cm Drainage (amount, consistency, odor) scant amt pink drainage Periwound: intact skin surrounding Dressing procedure/placement/frequency: Applied one piece black foam to cont suction. There are skin folds to middle left wound edge and lower wound edges; applied pieces of barrier ring to attempt to maintain a seal. WOC team will plan to change the dressing again on Fri. Thank-you,  Cammie Mcgee MSN, RN, CWOCN, [place], CNS [phone]

## 2022-08-30 NOTE — Consult Note (Signed)
WOC Nurse ostomy follow up Pt had colostomy surgery performed yesterday.  She is in pain and there is no output.  Discussed plans for a teaching session tomorrow and she requests 10:00, when her husband will be available.  Stoma type/location: Stoma is red and viable, appears to be flush with skin level when visualized through the current pouch, which is intact with good seal.  Output: no stool or flatus, small amt pink drainage in the pouch  Ostomy pouching: 1pc.  Education provided: Educational materials left at the bedside.  Ordered 6 sets of supplies to the room for staff nurses' use. Use Supplies: barrier ring, [employer] # [id number], and one piece flexible convex pouch [employer] [place] # [id number] Enrolled patient in [facility] Discharge program: Not yet I will perform first post-op pouch change and teaching session tomorrow at 10:00 as requested.  Thank-you,  Cammie Mcgee MSN, RN, CWOCN, [place], CNS [phone]

## 2022-08-30 NOTE — Progress Notes (Addendum)
[facility] Progress Note  1 Day Post-Op  Subjective: CC-  Husband at bedside. States that she had a lot of pain last night after surgery, but it is now better controlled. Using PCA often which helps. Denies n/v. No ostomy output. Has not yet been OOB.  Objective: Vital signs in last 24 hours: Temp:  [97.6 F (36.4 C)-98.6 F (37 C)] 97.6 F (36.4 C) (04/17 0941) Pulse Rate:  [81-95] 85 (04/17 0941) Resp:  [13-25] 16 (04/17 0941) BP: (127-168)/(71-89) 168/71 (04/17 0941) SpO2:  [91 %-100 %] 95 % (04/17 0941) FiO2 (%):  [100 %] 100 % (04/16 1724) Last BM Date : 08/27/22  Intake/Output from previous day: 04/16 0701 - 04/17 0700 In: 3464.5 [I.V.:2877; IV Piggyback:587.5] Out: 1285 [Urine:955; Stool:30; Blood:300] Intake/Output this shift: No intake/output data recorded.  PE: Gen:  Alert, NAD, pleasant Pulm: effort normal Abd: Soft, ND, appropriately tender, open midline incision with healthy granulation tissue and no erythema or drainage, ostomy viable with scan serosanguinous fluid in bag GU: foley with clear yellow urine  Lab Results:  Recent Labs    08/29/22 0319 08/30/22 0445  WBC 10.6* 15.0*  HGB 12.9 10.9*  HCT 40.4 34.9*  PLT 263 213   BMET Recent Labs    08/29/22 0319 08/30/22 0445  NA 137 137  K 3.6 3.8  CL 97* 100  CO2 28 29  GLUCOSE 102* 153*  BUN 16 18  CREATININE 0.96 0.89  CALCIUM 8.5* 8.3*   PT/INR No results for input(s): "LABPROT", "INR" in the last 72 hours. CMP     Component Value Date/Time   NA 137 08/30/2022 0445   K 3.8 08/30/2022 0445   CL 100 08/30/2022 0445   CO2 29 08/30/2022 0445   GLUCOSE 153 (H) 08/30/2022 0445   BUN 18 08/30/2022 0445   CREATININE 0.89 08/30/2022 0445   CALCIUM 8.3 (L) 08/30/2022 0445   PROT 6.1 (L) 08/30/2022 0445   ALBUMIN 3.0 (L) 08/30/2022 0445   AST 31 08/30/2022 0445   ALT 43 08/30/2022 0445   ALKPHOS 59 08/30/2022 0445   BILITOT 0.8 08/30/2022 0445   GFRNONAA >60 08/30/2022  0445   GFRNONAA 63 04/11/2022 1040   GFRAA >90 03/10/2013 1033   Lipase     Component Value Date/Time   LIPASE 30 08/17/2022 1359       Studies/Results: DG Abd 2 Views  Result Date: 08/28/2022 CLINICAL DATA:  Assess stool burden.  Pain EXAM: ABDOMEN - 2 VIEW COMPARISON:  CT 08/25/2022 and older FINDINGS: There is presumed contrast scattered throughout the nondilated colon and some nondilated loops of small bowel with air as well. Mild colonic stool suggested. No obstruction. No definite free air on these supine radiographs. Surgical clips in the right upper quadrant. Right hip arthroplasty. Curvature and degenerative changes of the spine. IMPRESSION: Nonspecific bowel gas pattern with diffuse bowel contrast. Mild scattered colonic stool Electronically Signed   By: Karen Kays M.D.   On: 08/28/2022 12:58    Anti-infectives: Anti-infectives (From admission, onward)    Start     Dose/Rate Route Frequency Ordered Stop   08/20/22 0900  piperacillin-tazobactam (ZOSYN) IVPB 3.375 g        3.375 g 12.5 mL/hr over 240 Minutes Intravenous Every 8 hours 08/20/22 0812     08/20/22 0845  piperacillin-tazobactam (ZOSYN) IVPB 3.375 g  Status:  Discontinued        3.375 g 100 mL/hr over 30 Minutes Intravenous Every 6 hours 08/20/22 0758 08/20/22  2111   08/17/22 1530  ciprofloxacin (CIPRO) IVPB 400 mg  Status:  Discontinued        400 mg 200 mL/hr over 60 Minutes Intravenous Every 12 hours 08/17/22 1453 08/20/22 0758   08/17/22 1430  metroNIDAZOLE (FLAGYL) IVPB 500 mg  Status:  Discontinued        500 mg 100 mL/hr over 60 Minutes Intravenous Every 12 hours 08/17/22 1428 08/20/22 0758   08/17/22 1430  ciprofloxacin (CIPRO) IVPB 400 mg  Status:  Discontinued        400 mg 200 mL/hr over 60 Minutes Intravenous  Once 08/17/22 1428 08/17/22 1453        Assessment/Plan 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 Carolynne Edouard - surgical path pending - continue sips/chips and await return in bowel function prior to advancing - mobilize, will ask PT to see - continue PCA for pain control - WOC following for new ostomy education. Will ask about placing wound vac to midline abdominal wound - discussed with Dr. Marina Goodell regarding steroids and ok to stop without taper  ID - zosyn (plan to continue postop at least until WBC normalizes and pt afebrile) VTE - SCDs, hold lovenox given ABLA and repeat CBC in AM prior to starting FEN - IVF, sips/chips Foley - d/c today vs tomorrow pending ability to mobilize   Acute blood loss anemia - Hgb 10.9 from 12.9, no hypotension or tachycardia. Repeat CBC in AM HTN Hypothyroidism HLD Anxiety and depression     LOS: 13 days    Franne Forts, [facility] 08/30/2022, 9:52 AM Please see Amion for pager number during day hours 7:00am-4:30pm

## 2022-08-31 DIAGNOSIS — K5792 Diverticulitis of intestine, part unspecified, without perforation or abscess without bleeding: Secondary | ICD-10-CM | POA: Diagnosis not present

## 2022-08-31 LAB — BASIC METABOLIC PANEL
Anion gap: 7 (ref 5–15)
BUN: 12 mg/dL (ref 8–23)
CO2: 29 mmol/L (ref 22–32)
Calcium: 8.3 mg/dL — ABNORMAL LOW (ref 8.9–10.3)
Chloride: 100 mmol/L (ref 98–111)
Creatinine, Ser: 0.76 mg/dL (ref 0.44–1.00)
GFR, Estimated: >60 mL/min (ref >60)
Glucose, Bld: 84 mg/dL (ref 70–99)
Potassium: 3.6 mmol/L (ref 3.5–5.1)
Sodium: 136 mmol/L (ref 135–145)

## 2022-08-31 LAB — CBC
HCT: 28.9 % — ABNORMAL LOW (ref 36.0–46.0)
Hemoglobin: 9.1 g/dL — ABNORMAL LOW (ref 12.0–15.0)
MCH: 28.7 pg (ref 26.0–34.0)
MCHC: 31.5 g/dL (ref 30.0–36.0)
MCV: 91.2 fL (ref 80.0–100.0)
Platelets: 156 10*3/uL (ref 150–400)
RBC: 3.17 MIL/uL — ABNORMAL LOW (ref 3.87–5.11)
RDW: 17 % — ABNORMAL HIGH (ref 11.5–15.5)
WBC: 12 10*3/uL — ABNORMAL HIGH (ref 4.0–10.5)
nRBC: 0 % (ref 0.0–0.2)

## 2022-08-31 LAB — SURGICAL PATHOLOGY

## 2022-08-31 LAB — GLUCOSE, CAPILLARY
Glucose-Capillary: 78 mg/dL (ref 70–99)
Glucose-Capillary: 89 mg/dL (ref 70–99)
Glucose-Capillary: 113 mg/dL — ABNORMAL HIGH (ref 70–99)
Glucose-Capillary: 87 mg/dL (ref 70–99)

## 2022-08-31 LAB — CALPROTECTIN, FECAL: Calprotectin, Fecal: 459 ug/g — ABNORMAL HIGH (ref 0–120)

## 2022-08-31 MED ORDER — BOOST / RESOURCE BREEZE PO LIQD CUSTOM
1.0000 | Freq: Three times a day (TID) | ORAL | Status: DC
  Administered 2022-08-31 (×3): 1 via ORAL

## 2022-08-31 MED ORDER — METHOCARBAMOL 1000 MG/10ML IJ SOLN: 500.0000 mg | Freq: Four times a day (QID) | INTRAVENOUS | Status: DC | PRN

## 2022-08-31 NOTE — Evaluation (Signed)
Occupational Therapy Evaluation Patient Details Name: Whitney Higgins MRN: [id number] DOB: 08-May-1956 Today's Date: 08/31/2022   History of Present Illness 67 yo female referred to ED on 08/17/2022 from OP GI due to abdominal pain with some nausea, episodes of emesis and diarrhea over the past 2 months and abn abdominal CT. Pt has PMH including but not limited to: anxiety, depression, OA, asthma, chronic LBP s/p lumbar laminectomy/decompression, gallstones, pituitary tumor, HTN, HDL, diverticulitis, Chohns colitis and IBS  Pt underwent L colectomy with transverse colostomy on 4/16 and wound vac  and now has orders for acute therapy intervention.   Clinical Impression   PTA, pt lived with her husband and was independent. Upon eval, pt presents with abdominal pain, decreased strength, and balance. Pt abd pain affecting ability to perform LB Adl and pt requiring mod A. Functional mobility with RW to restroom with min guard and heavy use of grab bars to rise from toilet surface. Pt to continue to benefit from skilled OT services to optimize safety and independence in ADL and IADL. Recommending discharge home with husband to assist as needed and OT in her natural setting.      Recommendations for follow up therapy are one component of a multi-disciplinary discharge planning process, led by the attending physician.  Recommendations may be updated based on patient status, additional functional criteria and insurance authorization.   Assistance Recommended at Discharge Intermittent Supervision/Assistance  Patient can return home with the following A little help with walking and/or transfers;A little help with bathing/dressing/bathroom;Help with stairs or ramp for entrance;Assist for transportation;Assistance with cooking/housework    Functional Status Assessment  Patient has had a recent decline in their functional status and demonstrates the ability to make significant improvements in function in a  reasonable and predictable amount of time.  Equipment Recommendations  BSC/3in1 (pt to benefit from bari bsc)    Recommendations for Other Services       Precautions / Restrictions Precautions Precautions: Fall Precaution Comments: VAC  , IV's in both arms, colostomy Restrictions Weight Bearing Restrictions: No      Mobility Bed Mobility Overal bed mobility: Needs Assistance Bed Mobility: Supine to Sit, Sit to Supine     Supine to sit: Min assist Sit to supine: Min assist   General bed mobility comments: HOB slightly elevated, instruction provided on log roll and use of bed rails    Transfers Overall transfer level: Needs assistance Equipment used: Rolling walker (2 wheels) Transfers: Sit to/from Stand Sit to Stand: Min guard           General transfer comment: increased time to rise from toilet and BSC. Use of bed rail/grab bars      Balance                                           ADL either performed or assessed with clinical judgement   ADL Overall ADL's : Needs assistance/impaired Eating/Feeding: Set up;Sitting   Grooming: Set up;Sitting   Upper Body Bathing: Set up;Sitting   Lower Body Bathing: Moderate assistance;Sit to/from stand   Upper Body Dressing : Set up;Sitting   Lower Body Dressing: Moderate assistance;Sit to/from stand   Toilet Transfer: Min guard;Ambulation;Rolling walker (2 wheels);Comfort height toilet;Grab bars Toilet Transfer Details (indicate cue type and reason): min guard A for safety; use of grab bars Toileting- Clothing Manipulation and Hygiene: Modified independent;Sitting/lateral lean  Functional mobility during ADLs: Min guard;Rolling walker (2 wheels) General ADL Comments: limited by abd pain     Vision Ability to See in Adequate Light: 0 Adequate Patient Visual Report: No change from baseline Vision Assessment?: No apparent visual deficits Additional Comments: WFL for tasks assessed      Perception Perception Perception Tested?: No   Praxis Praxis Praxis tested?: Within functional limits    Pertinent Vitals/Pain Pain Assessment Pain Assessment: Faces Faces Pain Scale: Hurts even more Pain Location: abdomen Pain Descriptors / Indicators: Grimacing, Guarding, Heaviness Pain Intervention(s): Limited activity within patient's tolerance, Monitored during session, Premedicated before session     Hand Dominance     Extremity/Trunk Assessment Upper Extremity Assessment Upper Extremity Assessment: Generalized weakness   Lower Extremity Assessment Lower Extremity Assessment: Defer to PT evaluation   Cervical / Trunk Assessment Cervical / Trunk Assessment: Normal   Communication Communication Communication: No difficulties   Cognition Arousal/Alertness: Awake/alert Behavior During Therapy: WFL for tasks assessed/performed Overall Cognitive Status: Within Functional Limits for tasks assessed                                 General Comments: will benefit from education regaring compensatory techniques for ADL     General Comments  VSS on RA for mobility to bathroom. SpO2>90    Exercises     Shoulder Instructions      Home Living Family/patient expects to be discharged to:: Private residence Living Arrangements: Spouse/significant other Available Help at Discharge: Family;Available 24 hours/day (Pt reports husband always available; she occasionally is alone due to husband running errands) Type of Home: House Home Access: Level entry     Home Layout: One level     Bathroom Shower/Tub: Producer, television/film/video: Standard Bathroom Accessibility: Yes   Home Equipment: Cane - single point          Prior Functioning/Environment Prior Level of Function : Independent/Modified Independent;Driving             Mobility Comments: mod I at [facility] level due to knee instability associated with pt reported miniscal tear and sx  pending. ADLs Comments: mod I with all ADLs, self care tasks, IADLs (limited community navigation) and driving        OT Problem List: Decreased strength;Decreased activity tolerance;Impaired balance (sitting and/or standing);Decreased knowledge of use of DME or AE      OT Treatment/Interventions: Self-care/ADL training;Therapeutic exercise;DME and/or AE instruction;Balance training;Patient/family education;Therapeutic activities    OT Goals(Current goals can be found in the care plan section) Acute Rehab OT Goals Patient Stated Goal: get better OT Goal Formulation: With patient Time For Goal Achievement: 09/14/22 Potential to Achieve Goals: Good  OT Frequency: Min 2X/week    Co-evaluation PT/OT/SLP Co-Evaluation/Treatment: Yes Reason for Co-Treatment: To address functional/ADL transfers;For patient/therapist safety PT goals addressed during session: Mobility/safety with mobility OT goals addressed during session: ADL's and self-care      AM-PAC OT "6 Clicks" Daily Activity     Outcome Measure Help from another person eating meals?: A Little Help from another person taking care of personal grooming?: A Little Help from another person toileting, which includes using toliet, bedpan, or urinal?: A Little Help from another person bathing (including washing, rinsing, drying)?: A Little Help from another person to put on and taking off regular upper body clothing?: A Little Help from another person to put on and taking off regular lower body clothing?: A  Lot 6 Click Score: 17   End of Session Equipment Utilized During Treatment: Rolling walker (2 wheels) Nurse Communication: Mobility status  Activity Tolerance: Patient tolerated treatment well Patient left: in bed;with call bell/phone within reach;with bed alarm set  OT Visit Diagnosis: Unsteadiness on feet (R26.81);Muscle weakness (generalized) (M62.81);Pain Pain - part of body:  (abdomen)                Time: 4163-8453 OT  Time Calculation (min): 26 min Charges:  OT General Charges $OT Visit: 1 Visit OT Evaluation $OT Eval Low Complexity: 1 Low  Tyler Deis, OTR/L [facility] Acute Rehabilitation Office: [phone]   Myrla Halsted 08/31/2022, 5:03 PM

## 2022-08-31 NOTE — Progress Notes (Signed)
PROGRESS NOTE    Whitney Higgins  ZOX:[id number] DOB: [birth year] DOA: 08/17/2022 PCP: Montez Hageman, DO   Brief Narrative: 67 year old white female with history of Crohn's colitis, previously managed with Lialda presenting with 75-month history of progressive symptoms and on admission imaging found to have a severe inflammatory process of the sigmoid colon-unclear whether this represented severe diverticulitis versus severe Crohn's colitis with both with associated phlegmon, possible early abscess and on most recent imaging has evidence of severe stricture of the sigmoid.   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 Oob ambulate PT eval   Hyperglycemia-CBG (last 3)  No previous history of DM 2, likely secondary to steroid use Hemoglobin A1c is 6.4 Continue SSI ACHS   Anxiety and depression Continue venlafaxine   HTN 128/76  Continue amlodipine   Hypothyroid Continue synthroid   Asthma-encourage incentive spirometry  Acute blood loss anemia -hemoglobin 9.1 from 10.9 from 12.9 yesterday Follow-up CBC in a.m.  Estimated body mass index is 46.37 kg/m as calculated from the following:   Height as of this encounter:  (1.626 m).   Weight as of this encounter: 122.5 kg.  DVT prophylaxis: Lovenox Code Status: Full code Family Communication: Husband at bedside Disposition Plan:   Status is: Inpatient Remains inpatient appropriate because: Acute diverticulitis status post colectomy   Consultants:  General surgery during GI  Procedures: Exploratory laparotomy left colectomy transverse colostomy 08/29/2022 Antimicrobials: Zosyn  Subjective: She reports having a good night Colostomy with loose stool Objective: Vitals:   08/31/22 0400 08/31/22 0426 08/31/22 0501 08/31/22 0907  BP:   131/72 128/76  Pulse:   82 79  Resp: Temp:   97.9 F (36.6 C) 98 F (36.7 C)  TempSrc:   Oral Oral  SpO2: 98% 98% 98% 99%  Weight:      Height:        Intake/Output Summary (Last 24 hours) at 08/31/2022 1342 Last data filed at 08/31/2022 1234 Gross per 24 hour  Intake 1533.66 ml  Output 2990 ml  Net -1456.34 ml    Filed Weights   08/17/22 1744  Weight: 122.5 kg    Examination:  General exam: Appears in no acute distress Respiratory system: Clear to auscultation. Respiratory effort normal. Cardiovascular system: S1 & S2 heard, RRR. No JVD, murmurs, rubs, gallops or clicks. No pedal edema. Gastrointestinal system: Incision forward with dressing  Colostomy with stool   Central nervous system: Alert and oriented. No focal neurological deficits. Extremities: trace edema.   Data Reviewed: I have personally reviewed following labs and imaging studies  CBC: Recent Labs  Lab 08/25/22 0341 08/27/22 0315 08/29/22 0319 08/30/22 0445 08/31/22 0454  WBC 8.4 9.1 10.6* 15.0* 12.0*  HGB 11.7* 11.9* 12.9 10.9* 9.1*  HCT 37.3 37.9 40.4 34.9* 28.9*  MCV 89.0 88.1 88.6 91.6 91.2  PLT 294 267 263  213 156    Basic Metabolic Panel: Recent Labs  Lab 08/25/22 0341 08/27/22 0315 08/29/22 0319 08/30/22 0445 08/31/22 0454  NA 137 137 137 137 136  K 3.9 4.5 3.6 3.8 3.6  CL 97* 98 97* 100 100  CO2 GLUCOSE 92 83 102* 153* 84  BUN CREATININE 0.97 0.94 0.96 0.89 0.76  CALCIUM 8.5* 8.8* 8.5* 8.3* 8.3*    GFR: Estimated  Creatinine Clearance: 86.9 mL/min (by C-G formula based on SCr of 0.76 mg/dL). Liver Function Tests: Recent Labs  Lab 08/30/22 0445  AST 31  ALT 43  ALKPHOS 59  BILITOT 0.8  PROT 6.1*  ALBUMIN 3.0*    No results for input(s): "LIPASE", "AMYLASE" in the last 168 hours. No results for input(s): "AMMONIA" in the last 168 hours. Coagulation Profile: No results for input(s): "INR", "PROTIME" in the last 168 hours. Cardiac Enzymes: No results for input(s): "CKTOTAL", "CKMB", "CKMBINDEX", "TROPONINI" in the last 168 hours. BNP (last 3 results) No results for input(s): "PROBNP" in the last 8760 hours. HbA1C: No results for input(s): "HGBA1C" in the last 72 hours. CBG: Recent Labs  Lab 08/30/22 1153 08/30/22 1652 08/30/22 2115 08/31/22 0712 08/31/22 1138  GLUCAP 133* 136* 105* 78 89    Lipid Profile: No results for input(s): "CHOL", "HDL", "LDLCALC", "TRIG", "CHOLHDL", "LDLDIRECT" in the last 72 hours. Thyroid Function Tests: No results for input(s): "TSH", "T4TOTAL", "FREET4", "T3FREE", "THYROIDAB" in the last 72 hours. Anemia Panel: No results for input(s): "VITAMINB12", "FOLATE", "FERRITIN", "TIBC", "IRON", "RETICCTPCT" in the last 72 hours. Sepsis Labs: No results for input(s): "PROCALCITON", "LATICACIDVEN" in the last 168 hours.  Recent Results (from the past 240 hour(s))  Surgical PCR screen     Status: Abnormal   Collection Time: 08/29/22  2:22 AM   Specimen: Nasal Mucosa; Nasal Swab  Result Value Ref Range Status   MRSA, PCR NEGATIVE NEGATIVE Final   Staphylococcus aureus POSITIVE (A) NEGATIVE Final    Comment: (NOTE) The Xpert SA Assay (FDA approved for NASAL specimens in patients 36 years of age and older), is one component of a comprehensive surveillance program. It is not intended to diagnose infection nor to guide or monitor treatment. Performed at [facility], [address], [place], [place] [address]          Radiology  Studies: No results found.      Scheduled Meds:  amLODipine  5 mg Oral Daily   Chlorhexidine Gluconate Cloth  6 each Topical Q0600   docusate sodium  100 mg Oral BID   feeding supplement  1 Container Oral TID BM   fentaNYL   Intravenous Q4H   insulin aspart  0-15 Units Subcutaneous TID WC   insulin aspart  0-5 Units Subcutaneous QHS   insulin aspart  3 Units Subcutaneous TID WC   levothyroxine  75 mcg Oral QAC breakfast   montelukast  10 mg Oral QHS   mupirocin ointment  1 Application Nasal BID   pantoprazole (PROTONIX) IV  40 mg Intravenous Q24H   sodium chloride flush  10-40 mL Intracatheter Q12H   venlafaxine XR  150 mg Oral BID   Continuous Infusions:  sodium chloride Stopped (08/28/22 1407)   lactated ringers 50 mL/hr at 08/31/22 1110   methocarbamol (ROBAXIN) IV     piperacillin-tazobactam (ZOSYN)  IV 3.375 g (08/31/22 0524)     LOS: 14 days    Time spent: 39 min  Alwyn Ren, MD  08/31/2022, 1:42 PM

## 2022-08-31 NOTE — Consult Note (Addendum)
WOC Nurse Wound consult Note: Pt states ostomy was leaking earlier and staff nurse changed it; there is stool trapped underneath the Vac drape so I changed the dressing.  Stool had not entered the wound bed. Pt was medicated for pain prior to the procedure using a PCA and tolerated with minimal discomfort. Full thickness midline wound is beefy red, scant amt pink drainage. Applied one piece black foam to cont suction, using barrier ring pieces to folds surrounding wound.   Pt will need home health assistance for Vac dressing changes after discharge.  WOC team will plan to change the dressing again on Mon.   WOC Nurse ostomy consult note Husband at the bedside for pouch change demonstration. Stoma type/location: Stoma is red and viable, flush with skin level, 1 5/8 inches and oval. Slaightly too large for opening, but I did not want to increase to the next pouch size since the stoma will eventually reduce in size. Demonstrated to patient and husband to stretch wider to make oval, instead of cutting outside the recommended line.  Peristomal assessment: intact skin surrounding.  Output: small amt (20cc) brown semiformed stool Ostomy pouching: 1pc Previously applied flat one piece pouch had leaked; Pt will need barrier ring and convex pouch.  Pt was able to stretch and apply barrier ring to the back of the pouch to fit, apply pouch with assistance using a hand held mirror, and open and close to empty. Husband was able to open and close to empty. Education provided: Discussed pouching routines and ordering supplies.  Educational materials left at the bedside, along with 5 sets of each supply. Use Supplies: barrier ring, [employer] # [id number], and one piece flexible convex pouch [employer] [place] # [phone] Do not cut beyond the black line on the pouch but stretch wider to form an oval to fit Enrolled patient in [employer] DC program: Yes, today.  WOC team will perform another pouch change along with Vac  dressing on Monday.  Thank-you,  Cammie Mcgee MSN, RN, CWOCN, [place], CNS [phone]

## 2022-08-31 NOTE — Progress Notes (Signed)
PT been refusing cpap

## 2022-08-31 NOTE — TOC Progression Note (Signed)
Transition of Care [facility]) - Progression Note    Patient Details  Name: Whitney Higgins MRN: [id number] Date of Birth: 16-Mar-1956  [facility]) CM/SW Contact  Armanda Heritage, RN Phone Number: 08/31/2022, 11:58 AM  Clinical Narrative:    CM noted TOC consult.  Spoke with patient who reports she lives with spouse and he will provide transportation home at discharge.  Patient reports no preference for [facility] agency or dme provider.  Requests rolling walker for home use.  MD notified and orders requested.  [employer] rep French Ana notified of need for wound vac at discharge, provided with PA email address for e-script.  [facility] will provide HHPT/OT/RN services, rep Becky Sax accepted referral.  [employer] contacted for rolling walker to be delivered to patient's bedside for home use.   Expected Discharge Plan: Home w Home Health Services Barriers to Discharge: No Barriers Identified  Expected Discharge Plan and Services   Discharge Planning Services: CM Consult Post Acute Care Choice: Durable Medical Equipment, Home Health Living arrangements for the past 2 months: Single Family Home                 DME Arranged: Negative pressure wound device, Walker rolling DME Agency: [employer], [employer] Date DME Agency Contacted: 08/31/22 Time DME Agency Contacted: 1156 Representative spoke with at DME Agency: French Ana with [employer] HH Arranged: PT, OT, RN HH Agency: [facility] Date [facility] Agency Contacted: 08/31/22 Time HH Agency Contacted: 1157 Representative spoke with at [facility] Agency: cheryl rose   Social Determinants of Health (SDOH) Interventions SDOH Screenings   Food Insecurity: No Food Insecurity (08/17/2022)  Housing: Low Risk  (08/17/2022)  Transportation Needs: No Transportation Needs (08/17/2022)  Utilities: Not At Risk (08/17/2022)  Tobacco Use: Low Risk  (08/30/2022)    Readmission Risk Interventions    08/18/2022    9:28 AM  Readmission Risk Prevention Plan  Post Dischage Appt  Complete  Medication Screening Complete  Transportation Screening Complete

## 2022-08-31 NOTE — Progress Notes (Signed)
Physical Therapy Treatment Patient Details Name: Whitney Higgins MRN: [id number] DOB: 1955/07/11 Today's Date: 08/31/2022   History of Present Illness 67 yo female referred to ED on 08/17/2022 from OP GI due to abdominal pain with some nausea, episodes of emesis and diarrhea over the past 2 months and abn abdominal CT. Pt has PMH including but not limited to: anxiety, depression, OA, asthma, chronic LBP s/p lumbar laminectomy/decompression, gallstones, pituitary tumor, HTN, HDL, diverticulitis, Chohns colitis and IBS  Pt underwent L colectomy with transverse colostomy on 4/16 and wound vac  and now has orders for acute therapy intervention.    PT Comments    The patient ambulated x 15' x 2  with Rw. Patient mobilizes slowly but did tolerate well. Continue  progressive mobility.  Recommendations for follow up therapy are one component of a multi-disciplinary discharge planning process, led by the attending physician.  Recommendations may be updated based on patient status, additional functional criteria and insurance authorization.  Follow Up Recommendations       Assistance Recommended at Discharge Frequent or constant Supervision/Assistance  Patient can return home with the following A lot of help with walking and/or transfers;Two people to help with bathing/dressing/bathroom;Assistance with cooking/housework;Assist for transportation;Help with stairs or ramp for entrance   Equipment Recommendations  Rolling walker (2 wheels)    Recommendations for Other Services       Precautions / Restrictions Precautions Precautions: Fall Precaution Comments: VAC  , IV's in both arms, colostomy     Mobility  Bed Mobility   Bed Mobility: Supine to Sit, Sit to Supine     Supine to sit: Min assist Sit to supine: Min assist   General bed mobility comments: HOB slightly elevated, instruction provided on log roll and use of bed rails    Transfers Overall transfer level: Needs  assistance Equipment used: Rolling walker (2 wheels) Transfers: Sit to/from Stand Sit to Stand: Min guard           General transfer comment: extra time to rise from bed and touilet, use of RW and rail.    Ambulation/Gait   Gait Distance (Feet): 15 Feet (x2) Assistive device: Rolling walker (2 wheels) Gait Pattern/deviations: Step-through pattern, Step-to pattern Gait velocity: decr     General Gait Details: 15   Stairs             Wheelchair Mobility    Modified Rankin (Stroke Patients Only)       Balance Overall balance assessment: Needs assistance Sitting-balance support: Feet supported Sitting balance-Leahy Scale: Fair     Standing balance support: Single extremity supported, During functional activity, Reliant on assistive device for balance Standing balance-Leahy Scale: Poor                              Cognition Arousal/Alertness: Awake/alert                                              Exercises      General Comments        Pertinent Vitals/Pain Pain Assessment Pain Assessment: Faces Faces Pain Scale: Hurts even more Pain Location: abdomen Pain Descriptors / Indicators: Grimacing, Guarding, Heaviness Pain Intervention(s): Limited activity within patient's tolerance, Monitored during session, Premedicated before session    Home Living  Prior Function            PT Goals (current goals can now be found in the care plan section) Progress towards PT goals: Progressing toward goals    Frequency    Min 1X/week      PT Plan Current plan remains appropriate    Co-evaluation PT/OT/SLP Co-Evaluation/Treatment: Yes Reason for Co-Treatment: To address functional/ADL transfers;For patient/therapist safety PT goals addressed during session: Mobility/safety with mobility OT goals addressed during session: ADL's and self-care      AM-PAC PT "6 Clicks" Mobility    Outcome Measure  Help needed turning from your back to your side while in a flat bed without using bedrails?: A Little Help needed moving from lying on your back to sitting on the side of a flat bed without using bedrails?: A Little Help needed moving to and from a bed to a chair (including a wheelchair)?: A Little Help needed standing up from a chair using your arms (e.g., wheelchair or bedside chair)?: A Lot Help needed to walk in hospital room?: Total Help needed climbing 3-5 steps with a railing? : Total 6 Click Score: 13    End of Session   Activity Tolerance: Patient tolerated treatment well Patient left: in bed;with call bell/phone within reach Nurse Communication: Mobility status;Other (comment) PT Visit Diagnosis: Unsteadiness on feet (R26.81);Muscle weakness (generalized) (M62.81);Pain     Time: 1610-9604 PT Time Calculation (min) (ACUTE ONLY): 23 min  Charges:  $Gait Training: 8-22 mins                     Blanchard Kelch PT Acute Rehabilitation Services Office [phone] Weekend pager-[phone]    Rada Hay 08/31/2022, 4:48 PM

## 2022-08-31 NOTE — Progress Notes (Signed)
[facility] Progress Note  2 Days Post-Op  Subjective: CC-  Comfortable this morning. Foley out and voiding well. Denies any n/v. She has had small amount of stool from ostomy. Ambulated multiple times yesterday. Pain well controlled with PCA.  Objective: Vital signs in last 24 hours: Temp:  [97.5 F (36.4 C)-98 F (36.7 C)] 98 F (36.7 C) (04/18 0907) Pulse Rate:  [79-91] 79 (04/18 0907) Resp:  [16-20] 18 (04/18 0907) BP: (128-168)/(66-79) 128/76 (04/18 0907) SpO2:  [92 %-99 %] 99 % (04/18 0907) FiO2 (%):  [0 %] 0 % (04/18 0426) Last BM Date : 08/31/22  Intake/Output from previous day: 04/17 0701 - 04/18 0700 In: 1533.7 [P.O.:510; I.V.:1023.7] Out: 1520 [Urine:1450; Drains:20; Stool:50] Intake/Output this shift: Total I/O In: -  Out: 820 [Urine:800; Stool:20]  PE: Gen:  Alert, NAD, pleasant Pulm: effort normal Abd: Soft, ND, appropriately tender, vac to midline incision with good seal, ostomy viable with thin brown stool in bag  Lab Results:  Recent Labs    08/30/22 0445 08/31/22 0454  WBC 15.0* 12.0*  HGB 10.9* 9.1*  HCT 34.9* 28.9*  PLT 213 156   BMET Recent Labs    08/30/22 0445 08/31/22 0454  NA 137 136  K 3.8 3.6  CL 100 100  CO2 29 29  GLUCOSE 153* 84  BUN 18 12  CREATININE 0.89 0.76  CALCIUM 8.3* 8.3*   PT/INR No results for input(s): "LABPROT", "INR" in the last 72 hours. CMP     Component Value Date/Time   NA 136 08/31/2022 0454   K 3.6 08/31/2022 0454   CL 100 08/31/2022 0454   CO2 29 08/31/2022 0454   GLUCOSE 84 08/31/2022 0454   BUN 12 08/31/2022 0454   CREATININE 0.76 08/31/2022 0454   CALCIUM 8.3 (L) 08/31/2022 0454   PROT 6.1 (L) 08/30/2022 0445   ALBUMIN 3.0 (L) 08/30/2022 0445   AST 31 08/30/2022 0445   ALT 43 08/30/2022 0445   ALKPHOS 59 08/30/2022 0445   BILITOT 0.8 08/30/2022 0445   GFRNONAA >60 08/31/2022 0454   GFRNONAA 63 04/11/2022 1040   GFRAA >90 03/10/2013 1033   Lipase     Component Value  Date/Time   LIPASE 30 08/17/2022 1359       Studies/Results: No results found.  Anti-infectives: Anti-infectives (From admission, onward)    Start     Dose/Rate Route Frequency Ordered Stop   08/20/22 0900  piperacillin-tazobactam (ZOSYN) IVPB 3.375 g        3.375 g 12.5 mL/hr over 240 Minutes Intravenous Every 8 hours 08/20/22 0812     08/20/22 0845  piperacillin-tazobactam (ZOSYN) IVPB 3.375 g  Status:  Discontinued        3.375 g 100 mL/hr over 30 Minutes Intravenous Every 6 hours 08/20/22 0758 08/20/22 0812   08/17/22 1530  ciprofloxacin (CIPRO) IVPB 400 mg  Status:  Discontinued        400 mg 200 mL/hr over 60 Minutes Intravenous Every 12 hours 08/17/22 1453 08/20/22 0758   08/17/22 1430  metroNIDAZOLE (FLAGYL) IVPB 500 mg  Status:  Discontinued        500 mg 100 mL/hr over 60 Minutes Intravenous Every 12 hours 08/17/22 1428 08/20/22 0758   08/17/22 1430  ciprofloxacin (CIPRO) IVPB 400 mg  Status:  Discontinued        400 mg 200 mL/hr over 60 Minutes Intravenous  Once 08/17/22 1428 08/17/22 1453        Assessment/Plan Exacerbation of colitis with  probable stricture  -POD#2 s/p EXPLORATORY LAPAROTOMY, EXTENDED LEFT COLECTOMY WITH MOBILIZATION SPLENIC FLEXURE, TRANSVERSE COLOSTOMY 4/16 Dr. Carolynne Edouard - surgical path pending - starting to have some ostomy output, ok for CLD - continue mobilizing, PT - recommend [facility] PT. OT consult pending - continue PCA for pain control - WOC following for new ostomy education. Vac MWF - will consult TOC for home health needs and see if a home wound vac would be an option - discussed with Dr. Marina Goodell 4/17 regarding steroids and ok to stop without taper   ID - zosyn (plan to continue postop at least until WBC normalizes and pt afebrile) VTE - SCDs, hold lovenox given ABLA and repeat CBC in AM prior to starting FEN - decrease IVF 50cc/hr, CLD Foley - out 4/17 and voiding   Acute blood loss anemia - Hgb 9.1 from 10.9, no hypotension or  tachycardia. Repeat CBC in AM HTN Hypothyroidism HLD Anxiety and depression    LOS: 14 days    Franne Forts, [facility] 08/31/2022, 11:10 AM Please see Amion for pager number during day hours 7:00am-4:30pm

## 2022-09-01 DIAGNOSIS — K5792 Diverticulitis of intestine, part unspecified, without perforation or abscess without bleeding: Secondary | ICD-10-CM | POA: Diagnosis not present

## 2022-09-01 LAB — BASIC METABOLIC PANEL
Anion gap: 7 (ref 5–15)
BUN: 8 mg/dL (ref 8–23)
CO2: 30 mmol/L (ref 22–32)
Calcium: 7.9 mg/dL — ABNORMAL LOW (ref 8.9–10.3)
Chloride: 95 mmol/L — ABNORMAL LOW (ref 98–111)
Creatinine, Ser: 0.75 mg/dL (ref 0.44–1.00)
GFR, Estimated: >60 mL/min (ref >60)
Glucose, Bld: 96 mg/dL (ref 70–99)
Potassium: 3 mmol/L — ABNORMAL LOW (ref 3.5–5.1)
Sodium: 132 mmol/L — ABNORMAL LOW (ref 135–145)

## 2022-09-01 LAB — CBC
HCT: 29.7 % — ABNORMAL LOW (ref 36.0–46.0)
Hemoglobin: 9.3 g/dL — ABNORMAL LOW (ref 12.0–15.0)
MCH: 28.8 pg (ref 26.0–34.0)
MCHC: 31.3 g/dL (ref 30.0–36.0)
MCV: 92 fL (ref 80.0–100.0)
Platelets: 147 10*3/uL — ABNORMAL LOW (ref 150–400)
RBC: 3.23 MIL/uL — ABNORMAL LOW (ref 3.87–5.11)
RDW: 17 % — ABNORMAL HIGH (ref 11.5–15.5)
WBC: 9.9 10*3/uL (ref 4.0–10.5)
nRBC: 0 % (ref 0.0–0.2)

## 2022-09-01 LAB — GLUCOSE, CAPILLARY
Glucose-Capillary: 97 mg/dL (ref 70–99)
Glucose-Capillary: 120 mg/dL — ABNORMAL HIGH (ref 70–99)
Glucose-Capillary: 105 mg/dL — ABNORMAL HIGH (ref 70–99)
Glucose-Capillary: 91 mg/dL (ref 70–99)

## 2022-09-01 LAB — MAGNESIUM: Magnesium: 2.2 mg/dL (ref 1.7–2.4)

## 2022-09-01 MED ORDER — ENSURE ENLIVE PO LIQD
237.0000 mL | Freq: Two times a day (BID) | ORAL | Status: DC
  Administered 2022-09-03 – 2022-09-04 (×2): 237 mL via ORAL

## 2022-09-01 MED ORDER — FENTANYL CITRATE PF 50 MCG/ML IJ SOSY: 25.0000 ug | PREFILLED_SYRINGE | INTRAMUSCULAR | Status: DC | PRN

## 2022-09-01 MED ORDER — ENOXAPARIN SODIUM 40 MG/0.4ML IJ SOSY
40.0000 mg | PREFILLED_SYRINGE | INTRAMUSCULAR | Status: DC
  Administered 2022-09-01 – 2022-09-04 (×4): 40 mg via SUBCUTANEOUS
  Filled 2022-09-01 (×4): qty 0.4

## 2022-09-01 MED ORDER — PANTOPRAZOLE SODIUM 40 MG PO TBEC
40.0000 mg | DELAYED_RELEASE_TABLET | Freq: Every day | ORAL | Status: DC
  Administered 2022-09-01 – 2022-09-04 (×4): 40 mg via ORAL
  Filled 2022-09-01 (×4): qty 1

## 2022-09-01 MED ORDER — ACETAMINOPHEN 500 MG PO TABS
1000.0000 mg | ORAL_TABLET | Freq: Four times a day (QID) | ORAL | Status: DC
  Administered 2022-09-01 – 2022-09-04 (×12): 1000 mg via ORAL
  Filled 2022-09-01 (×13): qty 2

## 2022-09-01 MED ORDER — METHOCARBAMOL 500 MG PO TABS
500.0000 mg | ORAL_TABLET | Freq: Four times a day (QID) | ORAL | Status: DC
  Administered 2022-09-01 – 2022-09-04 (×13): 500 mg via ORAL
  Filled 2022-09-01 (×13): qty 1

## 2022-09-01 MED ORDER — TRAMADOL HCL 50 MG PO TABS
50.0000 mg | ORAL_TABLET | Freq: Four times a day (QID) | ORAL | Status: DC | PRN
  Administered 2022-09-01 – 2022-09-04 (×6): 100 mg via ORAL
  Filled 2022-09-01 (×7): qty 2

## 2022-09-01 MED ORDER — POTASSIUM CHLORIDE CRYS ER 20 MEQ PO TBCR
40.0000 meq | EXTENDED_RELEASE_TABLET | Freq: Two times a day (BID) | ORAL | Status: AC
  Administered 2022-09-01 (×2): 40 meq via ORAL
  Filled 2022-09-01 (×2): qty 2

## 2022-09-01 NOTE — Progress Notes (Signed)
[facility] Progress Note  3 Days Post-Op  Subjective: CC-  Abdomen sore but pain well controlled. Not using PCA much. Does not do well with narcotics, but thinks she has tolerated tramadol in the past. Tolerating clear liquids without n/v. Ostomy productive. Did not like the boost. Ambulating well.  Objective: Vital signs in last 24 hours: Temp:  [97.7 F (36.5 C)-98.6 F (37 C)] 97.7 F (36.5 C) (04/19 0900) Pulse Rate:  [79-90] 90 (04/19 0900) Resp:  [16-20] 18 (04/19 0505) BP: (151-186)/(69-87) 186/80 (04/19 0900) SpO2:  [91 %-100 %] 91 % (04/19 0900) FiO2 (%):  [33 %-37 %] 37 % (04/19 0909) Last BM Date : 08/31/22  Intake/Output from previous day: 04/18 0701 - 04/19 0700 In: 1717.6 [P.O.:440; I.V.:1118.6; IV Piggyback:159] Out: 3420 [Urine:3300; Stool:120] Intake/Output this shift: No intake/output data recorded.  PE: Gen:  Alert, NAD, pleasant Pulm: effort normal Abd: Soft, ND, appropriately tender, vac to midline incision with good seal, ostomy viable with thin brown stool in bag   Lab Results:  Recent Labs    08/31/22 0454 09/01/22 0435  WBC 12.0* 9.9  HGB 9.1* 9.3*  HCT 28.9* 29.7*  PLT 156 147*   BMET Recent Labs    08/31/22 0454 09/01/22 0435  NA 136 132*  K 3.6 3.0*  CL 100 95*  CO2 29 30  GLUCOSE 84 96  BUN 12 8  CREATININE 0.76 0.75  CALCIUM 8.3* 7.9*   PT/INR No results for input(s): "LABPROT", "INR" in the last 72 hours. CMP     Component Value Date/Time   NA 132 (L) 09/01/2022 0435   K 3.0 (L) 09/01/2022 0435   CL 95 (L) 09/01/2022 0435   CO2 30 09/01/2022 0435   GLUCOSE 96 09/01/2022 0435   BUN 8 09/01/2022 0435   CREATININE 0.75 09/01/2022 0435   CALCIUM 7.9 (L) 09/01/2022 0435   PROT 6.1 (L) 08/30/2022 0445   ALBUMIN 3.0 (L) 08/30/2022 0445   AST 31 08/30/2022 0445   ALT 43 08/30/2022 0445   ALKPHOS 59 08/30/2022 0445   BILITOT 0.8 08/30/2022 0445   GFRNONAA >60 09/01/2022 0435   GFRNONAA 63 04/11/2022 1040    GFRAA >90 03/10/2013 1033   Lipase     Component Value Date/Time   LIPASE 30 08/17/2022 1359       Studies/Results: No results found.  Anti-infectives: Anti-infectives (From admission, onward)    Start     Dose/Rate Route Frequency Ordered Stop   08/20/22 0900  piperacillin-tazobactam (ZOSYN) IVPB 3.375 g        3.375 g 12.5 mL/hr over 240 Minutes Intravenous Every 8 hours 08/20/22 0812     08/20/22 0845  piperacillin-tazobactam (ZOSYN) IVPB 3.375 g  Status:  Discontinued        3.375 g 100 mL/hr over 30 Minutes Intravenous Every 6 hours 08/20/22 0758 08/20/22 0812   08/17/22 1530  ciprofloxacin (CIPRO) IVPB 400 mg  Status:  Discontinued        400 mg 200 mL/hr over 60 Minutes Intravenous Every 12 hours 08/17/22 1453 08/20/22 0758   08/17/22 1430  metroNIDAZOLE (FLAGYL) IVPB 500 mg  Status:  Discontinued        500 mg 100 mL/hr over 60 Minutes Intravenous Every 12 hours 08/17/22 1428 08/20/22 0758   08/17/22 1430  ciprofloxacin (CIPRO) IVPB 400 mg  Status:  Discontinued        400 mg 200 mL/hr over 60 Minutes Intravenous  Once 08/17/22 1428 08/17/22 1453  Assessment/Plan Exacerbation of colitis with probable stricture  -POD#3 s/p EXPLORATORY LAPAROTOMY, EXTENDED LEFT COLECTOMY WITH MOBILIZATION SPLENIC FLEXURE, TRANSVERSE COLOSTOMY 4/16 Dr. Carolynne Edouard - surgical path: benign stricture - ostomy productive, advance to fulls and as tolerated to soft diet. Try Ensure. Replace hypokalemia and check mag level - continue mobilizing, PT/OT - recommending HH therapies which has been ordered - d/c PCA. Schedule tylenol and robaxin. PRN tramadol and fentanyl for breakthrough pain - WOC following for new ostomy education. Vac MWF - home vac/RN approved - discussed with Dr. Marina Goodell 4/17 regarding steroids and ok to stop without taper   ID - zosyn (plan to continue 4-5 days postop) VTE - SCDs, start lovenox and check CBC in AM FEN - SLIV, FLD ADAT to soft Foley - out 4/17 and  voiding   Acute blood loss anemia - Hgb 9.3 from 9.1, stable HTN Hypothyroidism HLD Anxiety and depression    LOS: 15 days    Franne Forts, [facility] Surgery 09/01/2022, 10:17 AM Please see Amion for pager number during day hours 7:00am-4:30pm

## 2022-09-01 NOTE — Consult Note (Addendum)
WOC Nurse wound follow up Vac intact with good seal at cont suction.  Supplies ordered to the room for WOC team to change again on Mon.   WOC Nurse ostomy follow up Ostomy pouch intact with good seal, small amt semiformed brown stool.  Offered to perform another pouch change demonstration today but patient declines and states she will either wait until Mon or have home health assist with this after discharge. Encouraged patient to practice opening and closing and emptying when OOB to bathroom today and during the weekend. 5 sets of supplies left at the bedside for bedside nurses' use. WOC team will perform another pouch change and teaching session on Monday. Thank-you,  Cammie Mcgee MSN, RN, CWOCN, [place], CNS [phone]

## 2022-09-01 NOTE — Progress Notes (Signed)
PROGRESS NOTE    Whitney Higgins  ZOX:[id number] DOB: 07-09-1955 DOA: 08/17/2022 PCP: Montez Hageman, DO   Brief Narrative: 67 year old white female with history of Crohn's colitis, previously managed with Lialda presenting with 81-month history of progressive symptoms and on admission imaging found to have a severe inflammatory process of the sigmoid colon-unclear whether this represented severe diverticulitis versus severe Crohn's colitis with both with associated phlegmon, possible early abscess and on most recent imaging has evidence of severe stricture of the sigmoid.   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 Oob ambulate   Hyperglycemia-CBG (last 3)  No previous history of DM 2, likely secondary to steroid use Hemoglobin A1c is 6.4 Continue SSI ACHS CBG (last 3)  Recent Labs    08/31/22 2124 09/01/22 0709 09/01/22 1054  GLUCAP 87 97 120*      Anxiety and depression Continue venlafaxine   HTN -184/87 (likely due to pain from stopping the PCA )continue amlodipine   Hypothyroid Continue synthroid   Asthma-encourage incentive spirometry  Acute blood loss anemia -hemoglobin 9.1 from 10.9 from 12.9 yesterday Stable  Estimated body mass index is 46.37 kg/m as calculated from the following:   Height as of this  encounter:  (1.626 m).   Weight as of this encounter: 122.5 kg.  DVT prophylaxis: Lovenox Code Status: Full code Family Communication: Husband at bedside Disposition Plan:  Status is: Inpatient Remains inpatient appropriate because: Acute diverticulitis status post colectomy   Consultants:  General surgery during GI  Procedures: Exploratory laparotomy left colectomy transverse colostomy 08/29/2022 Antimicrobials: Zosyn  Subjective:  Sitting up in chair wants to go back to bed Colostomy bag with some loose stool Denies vomiting   objective: Vitals:   09/01/22 0403 09/01/22 0505 09/01/22 0900 09/01/22 1057  BP:  (!) 170/75 (!) 186/80 (!) 184/87  Pulse:  79 90 (!) 102  Resp: Temp:  98.1 F (36.7 C) 97.7 F (36.5 C) (!) 97.5 F (36.4 C)  TempSrc:   Oral Oral  SpO2: 97% 100% 91% 98%  Weight:      Height:        Intake/Output Summary (Last 24 hours) at 09/01/2022 1536 Last data filed at 09/01/2022 1306 Gross per 24 hour  Intake 1597.58 ml  Output 1401 ml  Net 196.58 ml    Filed Weights   08/17/22 1744  Weight: 122.5 kg    Examination:  General exam: Appears in no acute distress Respiratory system: Clear to auscultation. Respiratory effort normal. Cardiovascular system: S1 & S2 heard, RRR. No JVD, murmurs, rubs, gallops or clicks. No pedal edema. Gastrointestinal system: Incision forward with dressing  Colostomy with stool wound VAC in place Central nervous system: Alert and oriented. No focal neurological deficits. Extremities: trace edema.   Data Reviewed: I  have personally reviewed following labs and imaging studies  CBC: Recent Labs  Lab 08/27/22 0315 08/29/22 0319 08/30/22 0445 08/31/22 0454 09/01/22 0435  WBC 9.1 10.6* 15.0* 12.0* 9.9  HGB 11.9* 12.9 10.9* 9.1* 9.3*  HCT 37.9 40.4 34.9* 28.9* 29.7*  MCV 88.1 88.6 91.6 91.2 92.0  PLT 267 263 213 156 147*    Basic Metabolic Panel: Recent Labs  Lab 08/27/22 0315 08/29/22 0319  08/30/22 0445 08/31/22 0454 09/01/22 0430 09/01/22 0435  NA 137 137 137 136  --  132*  K 4.5 3.6 3.8 3.6  --  3.0*  CL 98 97* 100 100  --  95*  CO2 29 28 29 29   --  30  GLUCOSE 83 102* 153* 84  --  96  BUN 17 16 18 12   --  8  CREATININE 0.94 0.96 0.89 0.76  --  0.75  CALCIUM 8.8* 8.5* 8.3* 8.3*  --  7.9*  MG  --   --   --   --  2.2  --     GFR: Estimated Creatinine Clearance: 86.9 mL/min (by C-G formula based on SCr of 0.75 mg/dL). Liver Function Tests: Recent Labs  Lab 08/30/22 0445  AST 31  ALT 43  ALKPHOS 59  BILITOT 0.8  PROT 6.1*  ALBUMIN 3.0*    No results for input(s): "LIPASE", "AMYLASE" in the last 168 hours. No results for input(s): "AMMONIA" in the last 168 hours. Coagulation Profile: No results for input(s): "INR", "PROTIME" in the last 168 hours. Cardiac Enzymes: No results for input(s): "CKTOTAL", "CKMB", "CKMBINDEX", "TROPONINI" in the last 168 hours. BNP (last 3 results) No results for input(s): "PROBNP" in the last 8760 hours. HbA1C: No results for input(s): "HGBA1C" in the last 72 hours. CBG: Recent Labs  Lab 08/31/22 1138 08/31/22 1614 08/31/22 2124 09/01/22 0709 09/01/22 1054  GLUCAP 89 113* 87 97 120*    Lipid Profile: No results for input(s): "CHOL", "HDL", "LDLCALC", "TRIG", "CHOLHDL", "LDLDIRECT" in the last 72 hours. Thyroid Function Tests: No results for input(s): "TSH", "T4TOTAL", "FREET4", "T3FREE", "THYROIDAB" in the last 72 hours. Anemia Panel: No results for input(s): "VITAMINB12", "FOLATE", "FERRITIN", "TIBC", "IRON", "RETICCTPCT" in the last 72 hours. Sepsis Labs: No results for input(s): "PROCALCITON", "LATICACIDVEN" in the last 168 hours.  Recent Results (from the past 240 hour(s))  Calprotectin, Fecal     Status: Abnormal   Collection Time: 08/27/22  8:29 AM   Specimen: Stool  Result Value Ref Range Status   Calprotectin, Fecal 459 (H) 0 - 120 ug/g Final    Comment: (NOTE) Concentration     Interpretation    Follow-Up < 5 - 50 ug/g     Normal           None >50 -120 ug/g     Borderline       Re-evaluate in 4-6 weeks    >120 ug/g     Abnormal         Repeat as clinically                                   indicated Performed At: [facility] [address] [place], [place] [id number] Jolene Schimke MD WU:[id number]   Surgical PCR screen     Status: Abnormal   Collection Time: 08/29/22  2:22 AM   Specimen: Nasal Mucosa; Nasal Swab  Result Value Ref Range Status   MRSA, PCR NEGATIVE NEGATIVE  Final   Staphylococcus aureus POSITIVE (A) NEGATIVE Final    Comment: (NOTE) The Xpert SA Assay (FDA approved for NASAL specimens in patients 57 years of age and older), is one component of a comprehensive surveillance program. It is not intended to diagnose infection nor to guide or monitor treatment. Performed at [facility], [address] [place], [place] [address]          Radiology Studies: No results found.      Scheduled Meds:  acetaminophen  1,000 mg Oral Q6H   amLODipine  5 mg Oral Daily   Chlorhexidine Gluconate Cloth  6 each Topical Q0600   docusate sodium  100 mg Oral BID   enoxaparin (LOVENOX) injection  40 mg Subcutaneous Q24H   feeding supplement  237 mL Oral BID BM   insulin aspart  0-15 Units Subcutaneous TID WC   insulin aspart  0-5 Units Subcutaneous QHS   insulin aspart  3 Units Subcutaneous TID WC   levothyroxine  75 mcg Oral QAC breakfast   methocarbamol  500 mg Oral Q6H   montelukast  10 mg Oral QHS   mupirocin ointment  1 Application Nasal BID   pantoprazole  40 mg Oral Daily   potassium chloride  40 mEq Oral BID   sodium chloride flush  10-40 mL Intracatheter Q12H   venlafaxine XR  150 mg Oral BID   Continuous Infusions:  sodium chloride 10 mL (09/01/22 1530)   piperacillin-tazobactam (ZOSYN)  IV 3.375 g (09/01/22 1332)     LOS: 15 days    Time spent: 39 min  Alwyn Ren, MD  09/01/2022, 3:36 PM

## 2022-09-01 NOTE — Progress Notes (Signed)
Pt very reluctant to ambulate, very reluctant to sit up in recliner. Reviewed complications of BR w pt, enc her to use IS, be up in chair and walk halls 3-4 times/day. Pt verbalized understanding.

## 2022-09-01 NOTE — Telephone Encounter (Signed)
PA not submitted per RN Viviann Spare

## 2022-09-01 NOTE — TOC Progression Note (Signed)
Transition of Care [facility]) - Progression Note    Patient Details  Name: Whitney Higgins MRN: [id number] Date of Birth: 08/01/1955  [facility] CM/SW Contact  Armanda Heritage, RN Phone Number: 09/01/2022, 1:17 PM  Clinical Narrative:    CM spoke with French Ana with [employer], who reports everything is set up for wound vac delivery to bedside.  French Ana will need to be notified day of discharge so that the wound vac can be delivered to bedside French Ana [phone]).   Expected Discharge Plan: Home w Home Health Services Barriers to Discharge: No Barriers Identified  Expected Discharge Plan and Services   Discharge Planning Services: CM Consult Post Acute Care Choice: Durable Medical Equipment, Home Health Living arrangements for the past 2 months: Single Family Home                 DME Arranged: Negative pressure wound device, Walker rolling DME Agency: [employer], [employer] Date DME Agency Contacted: 08/31/22 Time DME Agency Contacted: 1156 Representative spoke with at DME Agency: French Ana with [employer] HH Arranged: PT, OT, RN HH Agency: [facility] Date [facility] Agency Contacted: 08/31/22 Time HH Agency Contacted: 1157 Representative spoke with at [facility] Agency: cheryl rose   Social Determinants of Health (SDOH) Interventions SDOH Screenings   Food Insecurity: No Food Insecurity (08/17/2022)  Housing: Low Risk  (08/17/2022)  Transportation Needs: No Transportation Needs (08/17/2022)  Utilities: Not At Risk (08/17/2022)  Tobacco Use: Low Risk  (08/30/2022)    Readmission Risk Interventions    08/18/2022    9:28 AM  Readmission Risk Prevention Plan  Post Dischage Appt Complete  Medication Screening Complete  Transportation Screening Complete

## 2022-09-01 NOTE — Discharge Instructions (Signed)
[facility]      [facility] [phone]  OPEN ABDOMINAL SURGERY: POST OP INSTRUCTIONS  Always review your discharge instruction sheet given to you by the facility where your surgery was performed.  IF YOU HAVE DISABILITY OR FAMILY LEAVE FORMS, YOU MUST BRING THEM TO THE OFFICE FOR PROCESSING.  PLEASE DO NOT GIVE THEM TO YOUR DOCTOR.  A prescription for pain medication may be given to you upon discharge.  Take your pain medication as prescribed, if needed.  If narcotic pain medicine is not needed, then you may take acetaminophen (Tylenol) or ibuprofen (Advil) as needed. Take your usually prescribed medications unless otherwise directed. If you need a refill on your pain medication, please contact your pharmacy. They will contact our office to request authorization.  Prescriptions will not be filled after 5pm or on week-ends. You should follow a light diet the first few days after arrival home, such as soup and crackers, pudding, etc.unless your doctor has advised otherwise. A high-fiber, low fat diet can be resumed as tolerated.   Be sure to include lots of fluids daily. Most patients will experience some swelling and bruising on the chest and neck area.  Ice packs will help.  Swelling and bruising can take several days to resolve Most patients will experience some swelling and bruising in the area of the incision. Ice pack will help. Swelling and bruising can take several days to resolve..  It is common to experience some constipation if taking pain medication after surgery.  Increasing fluid intake and taking a stool softener will usually help or prevent this problem from occurring.  A mild laxative (Milk of Magnesia or Miralax) should be taken according to package directions if there are no bowel movements after 48 hours. ACTIVITIES:  You may resume regular (light) daily activities beginning the next day--such as daily self-care, walking, climbing stairs--gradually increasing activities  as tolerated.  You may have sexual intercourse when it is comfortable.  Refrain from any heavy lifting or straining until approved by your doctor. You may drive when you no longer are taking prescription pain medication, you can comfortably wear a seatbelt, and you can safely maneuver your car and apply brakes You should see your doctor in the office for a follow-up appointment approximately two weeks after your surgery.  Make sure that you call for this appointment within a day or two after you arrive home to insure a convenient appointment time.  WHEN TO CALL YOUR DOCTOR: Fever over 101.0 Inability to urinate Nausea and/or vomiting Extreme swelling or bruising Continued bleeding from incision. Increased pain, redness, or drainage from the incision. Difficulty swallowing or breathing Muscle cramping or spasms. Numbness or tingling in hands or feet or around lips.  The clinic staff is available to answer your questions during regular business hours.  Please don't hesitate to call and ask to speak to one of the nurses if you have concerns.  For further questions, please visit [email or web address]   

## 2022-09-01 NOTE — Progress Notes (Signed)
Physical Therapy Treatment Patient Details Name: Whitney Higgins MRN: [id number] DOB: July 18, 1955 Today's Date: 09/01/2022   History of Present Illness 67 yo female referred to ED on 08/17/2022 from OP GI due to abdominal pain with some nausea, episodes of emesis and diarrhea over the past 2 months and abn abdominal CT. Pt has PMH including but not limited to: anxiety, depression, OA, asthma, chronic LBP s/p lumbar laminectomy/decompression, gallstones, pituitary tumor, HTN, HDL, diverticulitis, Chohns colitis and IBS  Pt underwent L colectomy with transverse colostomy on 4/16 and wound vac  and now has orders for acute therapy intervention.    PT Comments     Pt admitted with above diagnosis.  Pt currently with functional limitations due to the deficits listed below (see PT Problem List).  Pt is demonstrating good progress since Eval 4/17 and is reporting decreased abdominal pain. PT upgraded goals for gait and transfer tasks. Pt required min A for supine <> sit, cues for log roll and HOB slightly elevated, STS from EOB with min guard to RW, gait tasks in hallway 140 feet with RW and close S with one standing rest break. Pt will benefit from acute skilled PT in current and next venue to increase their independence and safety with functional mobility.      Recommendations for follow up therapy are one component of a multi-disciplinary discharge planning process, led by the attending physician.  Recommendations may be updated based on patient status, additional functional criteria and insurance authorization.  Follow Up Recommendations       Assistance Recommended at Discharge Frequent or constant Supervision/Assistance  Patient can return home with the following A lot of help with walking and/or transfers;Two people to help with bathing/dressing/bathroom;Assistance with cooking/housework;Assist for transportation;Help with stairs or ramp for entrance   Equipment Recommendations  Rolling walker (2  wheels)    Recommendations for Other Services       Precautions / Restrictions Precautions Precautions: Fall Precaution Comments: VAC  , IV's in both arms, colostomy Restrictions Weight Bearing Restrictions: No     Mobility  Bed Mobility Overal bed mobility: Needs Assistance Bed Mobility: Supine to Sit, Sit to Supine     Supine to sit: Min assist Sit to supine: Min assist   General bed mobility comments: HOB slightly elevated, instruction provided on log roll and use of bed rails    Transfers Overall transfer level: Needs assistance Equipment used: Rolling walker (2 wheels) Transfers: Sit to/from Stand Sit to Stand: Min guard           General transfer comment: min cues for proper UE placement    Ambulation/Gait Ambulation/Gait assistance: Supervision Gait Distance (Feet): 140 Feet Assistive device: Rolling walker (2 wheels) Gait Pattern/deviations: Step-through pattern, Wide base of support Gait velocity: decreased     General Gait Details: one standing rest break at end of hallway at window   Stairs             Wheelchair Mobility    Modified Rankin (Stroke Patients Only)       Balance Overall balance assessment: Needs assistance Sitting-balance support: Feet supported Sitting balance-Leahy Scale: Good     Standing balance support: Single extremity supported, During functional activity, Reliant on assistive device for balance Standing balance-Leahy Scale: Poor                              Cognition Arousal/Alertness: Awake/alert Behavior During Therapy: WFL for tasks assessed/performed Overall Cognitive  Status: Within Functional Limits for tasks assessed                                 General Comments: will benefit from education regaring compensatory techniques for ADL        Exercises      General Comments        Pertinent Vitals/Pain Pain Assessment Pain Assessment: 0-10 Pain Score: 5  Pain  Location: abdomen Pain Descriptors / Indicators: Grimacing, Guarding, Heaviness    Home Living Family/patient expects to be discharged to:: Private residence Living Arrangements: Spouse/significant other Available Help at Discharge: Family;Available 24 hours/day (Pt reports husband always available; she occasionally is alone due to husband running errands) Type of Home: House Home Access: Level entry       Home Layout: One level Home Equipment: Cane - single point      Prior Function            PT Goals (current goals can now be found in the care plan section) Acute Rehab PT Goals Patient Stated Goal: STG: to be able to use the bathroom; LTG be able to attend grandchildrens sporting events PT Goal Formulation: With patient Time For Goal Achievement: 09/20/22 Potential to Achieve Goals: Good    Frequency    Min 1X/week      PT Plan      Co-evaluation              AM-PAC PT "6 Clicks" Mobility   Outcome Measure  Help needed turning from your back to your side while in a flat bed without using bedrails?: A Little Help needed moving from lying on your back to sitting on the side of a flat bed without using bedrails?: A Little Help needed moving to and from a bed to a chair (including a wheelchair)?: A Little Help needed standing up from a chair using your arms (e.g., wheelchair or bedside chair)?: A Little Help needed to walk in hospital room?: A Little Help needed climbing 3-5 steps with a railing? : Total 6 Click Score: 16    End of Session Equipment Utilized During Treatment: Gait belt Activity Tolerance: Patient tolerated treatment well Patient left: in bed;with call bell/phone within reach Nurse Communication: Mobility status;Other (comment) PT Visit Diagnosis: Unsteadiness on feet (R26.81);Muscle weakness (generalized) (M62.81);Pain     Time: 1430-1453 PT Time Calculation (min) (ACUTE ONLY): 23 min  Charges:  $Gait Training: 8-22  mins $Therapeutic Activity: 8-22 mins                     Rica Mote, PT    Jacqualyn Posey 09/01/2022, 3:13 PM

## 2022-09-01 NOTE — Care Management Important Message (Signed)
Important Message  Patient Details IM Letter given. Name: Whitney Higgins MRN: [id number] Date of Birth: 07-27-1955   Medicare Important Message Given:  Yes     Caren Macadam 09/01/2022, 2:21 PM

## 2022-09-02 DIAGNOSIS — K5792 Diverticulitis of intestine, part unspecified, without perforation or abscess without bleeding: Secondary | ICD-10-CM | POA: Diagnosis not present

## 2022-09-02 LAB — BASIC METABOLIC PANEL
Anion gap: 6 (ref 5–15)
BUN: 9 mg/dL (ref 8–23)
CO2: 29 mmol/L (ref 22–32)
Calcium: 8 mg/dL — ABNORMAL LOW (ref 8.9–10.3)
Chloride: 98 mmol/L (ref 98–111)
Creatinine, Ser: 0.7 mg/dL (ref 0.44–1.00)
GFR, Estimated: >60 mL/min (ref >60)
Glucose, Bld: 87 mg/dL (ref 70–99)
Potassium: 3.7 mmol/L (ref 3.5–5.1)
Sodium: 133 mmol/L — ABNORMAL LOW (ref 135–145)

## 2022-09-02 LAB — CBC
HCT: 29.9 % — ABNORMAL LOW (ref 36.0–46.0)
Hemoglobin: 9.4 g/dL — ABNORMAL LOW (ref 12.0–15.0)
MCH: 28.6 pg (ref 26.0–34.0)
MCHC: 31.4 g/dL (ref 30.0–36.0)
MCV: 90.9 fL (ref 80.0–100.0)
Platelets: 153 10*3/uL (ref 150–400)
RBC: 3.29 MIL/uL — ABNORMAL LOW (ref 3.87–5.11)
RDW: 17.2 % — ABNORMAL HIGH (ref 11.5–15.5)
WBC: 9 10*3/uL (ref 4.0–10.5)
nRBC: 0 % (ref 0.0–0.2)

## 2022-09-02 LAB — GLUCOSE, CAPILLARY
Glucose-Capillary: 93 mg/dL (ref 70–99)
Glucose-Capillary: 90 mg/dL (ref 70–99)
Glucose-Capillary: 76 mg/dL (ref 70–99)
Glucose-Capillary: 89 mg/dL (ref 70–99)

## 2022-09-02 MED ORDER — LISINOPRIL-HYDROCHLOROTHIAZIDE 20-25 MG PO TABS: 1.0000 | ORAL_TABLET | Freq: Every day | ORAL | Status: DC

## 2022-09-02 MED ORDER — DICYCLOMINE HCL 20 MG PO TABS
10.0000 mg | ORAL_TABLET | Freq: Three times a day (TID) | ORAL | Status: DC | PRN
  Administered 2022-09-03 – 2022-09-04 (×2): 10 mg via ORAL
  Filled 2022-09-02 (×3): qty 1

## 2022-09-02 MED ORDER — LIOTHYRONINE SODIUM 5 MCG PO TABS
5.0000 ug | ORAL_TABLET | Freq: Every day | ORAL | Status: DC
  Administered 2022-09-02 – 2022-09-04 (×3): 5 ug via ORAL
  Filled 2022-09-02 (×3): qty 1

## 2022-09-02 MED ORDER — LORATADINE 10 MG PO TABS
10.0000 mg | ORAL_TABLET | Freq: Every day | ORAL | Status: DC
  Administered 2022-09-02 – 2022-09-04 (×3): 10 mg via ORAL
  Filled 2022-09-02 (×3): qty 1

## 2022-09-02 MED ORDER — LISINOPRIL 20 MG PO TABS
20.0000 mg | ORAL_TABLET | Freq: Every day | ORAL | Status: DC
  Administered 2022-09-02 – 2022-09-04 (×3): 20 mg via ORAL
  Filled 2022-09-02 (×3): qty 1

## 2022-09-02 MED ORDER — ALPRAZOLAM 0.5 MG PO TABS: 0.5000 mg | ORAL_TABLET | Freq: Every evening | ORAL | Status: DC | PRN

## 2022-09-02 MED ORDER — ATORVASTATIN CALCIUM 20 MG PO TABS
20.0000 mg | ORAL_TABLET | Freq: Every day | ORAL | Status: DC
  Administered 2022-09-02 – 2022-09-04 (×3): 20 mg via ORAL
  Filled 2022-09-02 (×3): qty 1

## 2022-09-02 MED ORDER — HYDROCHLOROTHIAZIDE 25 MG PO TABS
25.0000 mg | ORAL_TABLET | Freq: Every day | ORAL | Status: DC
  Administered 2022-09-02 – 2022-09-04 (×3): 25 mg via ORAL
  Filled 2022-09-02 (×3): qty 1

## 2022-09-02 NOTE — Progress Notes (Signed)
Mobility Specialist - Progress Note   09/02/22 1204  Mobility  Activity Ambulated with assistance in hallway;Ambulated with assistance to bathroom  Level of Assistance Standby assist, set-up cues, supervision of patient - no hands on  Assistive Device Front wheel walker  Distance Ambulated (ft) 80 ft  Activity Response Tolerated well  Mobility Referral Yes  $Mobility charge 1 Mobility   Pt received in bed and agreeable to mobility. No complaints during session. When returning to room pt requested to use the bathroom. Pt to EOB for meal after session with all needs met.    [facility]

## 2022-09-02 NOTE — Progress Notes (Signed)
4 Days Post-Op   Subjective/Chief Complaint: Complains of soreness but otherwise okay   Objective: Vital signs in last 24 hours: Temp:  [97.5 F (36.4 C)-98.3 F (36.8 C)] 98.1 F (36.7 C) (04/20 0410) Pulse Rate:  [84-102] 87 (04/20 0410) Resp:  [18-20] 20 (04/20 0410) BP: (176-184)/(79-87) 176/82 (04/20 0410) SpO2:  [91 %-98 %] 92 % (04/20 0410) Last BM Date : 09/01/22  Intake/Output from previous day: 04/19 0701 - 04/20 0700 In: 1358.4 [P.O.:1140; I.V.:80; IV Piggyback:138.4] Out: 726 [Urine:401; Drains:25; Stool:300] Intake/Output this shift: Total I/O In: -  Out: 650 [Urine:400; Stool:250]  General appearance: alert and cooperative Resp: clear to auscultation bilaterally Cardio: regular rate and rhythm GI: Soft with minimal tenderness.  Ostomy pink and productive.  VAC dressing in place  Lab Results:  Recent Labs    09/01/22 0435 09/02/22 0553  WBC 9.9 9.0  HGB 9.3* 9.4*  HCT 29.7* 29.9*  PLT 147* 153   BMET Recent Labs    08/31/22 0454 09/01/22 0435  NA 136 132*  K 3.6 3.0*  CL 100 95*  CO2 29 30  GLUCOSE 84 96  BUN 12 8  CREATININE 0.76 0.75  CALCIUM 8.3* 7.9*   PT/INR No results for input(s): "LABPROT", "INR" in the last 72 hours. ABG No results for input(s): "PHART", "HCO3" in the last 72 hours.  Invalid input(s): "PCO2", "PO2"  Studies/Results: No results found.  Anti-infectives: Anti-infectives (From admission, onward)    Start     Dose/Rate Route Frequency Ordered Stop   08/20/22 0900  piperacillin-tazobactam (ZOSYN) IVPB 3.375 g        3.375 g 12.5 mL/hr over 240 Minutes Intravenous Every 8 hours 08/20/22 0812     08/20/22 0845  piperacillin-tazobactam (ZOSYN) IVPB 3.375 g  Status:  Discontinued        3.375 g 100 mL/hr over 30 Minutes Intravenous Every 6 hours 08/20/22 0758 08/20/22 0812   08/17/22 1530  ciprofloxacin (CIPRO) IVPB 400 mg  Status:  Discontinued        400 mg 200 mL/hr over 60 Minutes Intravenous Every 12 hours  08/17/22 1453 08/20/22 0758   08/17/22 1430  metroNIDAZOLE (FLAGYL) IVPB 500 mg  Status:  Discontinued        500 mg 100 mL/hr over 60 Minutes Intravenous Every 12 hours 08/17/22 1428 08/20/22 0758   08/17/22 1430  ciprofloxacin (CIPRO) IVPB 400 mg  Status:  Discontinued        400 mg 200 mL/hr over 60 Minutes Intravenous  Once 08/17/22 1428 08/17/22 1453       Assessment/Plan: s/p Procedure(s): EXPLORATORY LAPAROTOMY, EXTENDED LEFT COLECTOMY WITH MOBILIZATION SPLENIC FLEXURE, TRANSVERSE COLOSTOMY (N/A) Advance diet Exacerbation of colitis with probable stricture  -POD#4 s/p EXPLORATORY LAPAROTOMY, EXTENDED LEFT COLECTOMY WITH MOBILIZATION SPLENIC FLEXURE, TRANSVERSE COLOSTOMY 4/16 Dr. Carolynne Edouard - surgical path: benign stricture - ostomy productive, advance to fulls and as tolerated to soft diet. Try Ensure. Replace hypokalemia and check mag level - continue mobilizing, PT/OT - recommending HH therapies which has been ordered - d/c PCA. Schedule tylenol and robaxin. PRN tramadol and fentanyl for breakthrough pain - WOC following for new ostomy education. Vac MWF - home vac/RN approved - discussed with Dr. Marina Goodell 4/17 regarding steroids and ok to stop without taper   ID - zosyn (plan to continue 4-5 days postop) VTE - SCDs, start lovenox and check CBC in AM FEN - SLIV, FLD ADAT to soft Foley - out 4/17 and voiding   Acute blood loss  anemia - Hgb 9.3 from 9.1, stable HTN Hypothyroidism HLD Anxiety and depression  LOS: 16 days    Whitney Higgins 09/02/2022

## 2022-09-02 NOTE — Progress Notes (Signed)
PROGRESS NOTE    Whitney Higgins  ZOX:[id number] DOB: 12-26-1955 DOA: 08/17/2022 PCP: Montez Hageman, DO   Brief Narrative: 67 year old white female with history of Crohn's colitis, previously managed with Lialda presenting with 3-month history of progressive symptoms and on admission imaging found to have a severe inflammatory process of the sigmoid colon-unclear whether this represented severe diverticulitis versus severe Crohn's colitis with both with associated phlegmon, possible early abscess and on most recent imaging has evidence of severe stricture of the sigmoid.   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 Oob ambulate Advancing diet as tolerated   Hyperglycemia-CBG (last 3)  No previous history of DM 2, likely secondary to steroid use Hemoglobin A1c is 6.4 Continue SSI ACHS CBG (last 3)  Recent Labs    09/01/22 1626 09/01/22 2010 09/02/22 0723  GLUCAP 105* 91 93    Anxiety and depression Continue venlafaxine   HTN -blood pressure remains elevated on amlodipine.  Restart home dose of HCTZ and lisinopril.     Hypothyroid Continue synthroid and Cytomel   Asthma-encourage incentive spirometry  Acute blood loss anemia -hemoglobin 9.4 from 10.9 from 12.9 yesterday Stable  Estimated body mass index is 46.37 kg/m as calculated from the following:    Height as of this encounter:  (1.626 m).   Weight as of this encounter: 122.5 kg.  DVT prophylaxis: Lovenox Code Status: Full code Family Communication: Husband at bedside Disposition Plan:  Status is: Inpatient Remains inpatient appropriate because: Acute diverticulitis status post colectomy   Consultants:  General surgery during GI  Procedures: Exploratory laparotomy left colectomy transverse colostomy 08/29/2022 Antimicrobials: Zosyn  Subjective: Resting in bed Wound VAC in place Trying to eat some breakfast objective: Vitals:   09/01/22 0900 09/01/22 1057 09/01/22 2014 09/02/22 0410  BP: (!) 186/80 (!) 184/87 (!) 180/79 (!) 176/82  Pulse: 90 (!) 102 84 87  Resp:  Temp: 97.7 F (36.5 C) (!) 97.5 F (36.4 C) 98.3 F (36.8 C) 98.1 F (36.7 C)  TempSrc: Oral Oral Oral Oral  SpO2: 91% 98% 91% 92%  Weight:      Height:        Intake/Output Summary (Last 24 hours) at 09/02/2022 1032 Last data filed at 09/02/2022 0940 Gross per 24 hour  Intake 1079.95 ml  Output 1376 ml  Net -296.05 ml    Filed Weights   08/17/22 1744  Weight: 122.5 kg    Examination:  General exam: Appears in no acute distress Respiratory system: Clear to auscultation. Respiratory effort normal. Cardiovascular system: S1 & S2 heard, RRR. No JVD, murmurs, rubs, gallops or clicks. No pedal edema. Gastrointestinal system: Incision forward with dressing  Colostomy with stool wound VAC in place Central nervous system: Alert and oriented. No focal neurological deficits. Extremities: trace edema.   Data Reviewed: I have personally reviewed following  labs and imaging studies  CBC: Recent Labs  Lab 08/29/22 0319 08/30/22 0445 08/31/22 0454 09/01/22 0435 09/02/22 0553  WBC 10.6* 15.0* 12.0* 9.9 9.0  HGB 12.9 10.9* 9.1* 9.3* 9.4*  HCT 40.4 34.9* 28.9* 29.7* 29.9*  MCV 88.6 91.6 91.2 92.0 90.9  PLT 263 213 156 147* 153    Basic Metabolic Panel: Recent Labs  Lab  08/27/22 0315 08/29/22 0319 08/30/22 0445 08/31/22 0454 09/01/22 0430 09/01/22 0435  NA 137 137 137 136  --  132*  K 4.5 3.6 3.8 3.6  --  3.0*  CL 98 97* 100 100  --  95*  CO2 29 28 29 29   --  30  GLUCOSE 83 102* 153* 84  --  96  BUN 17 16 18 12   --  8  CREATININE 0.94 0.96 0.89 0.76  --  0.75  CALCIUM 8.8* 8.5* 8.3* 8.3*  --  7.9*  MG  --   --   --   --  2.2  --     GFR: Estimated Creatinine Clearance: 86.9 mL/min (by C-G formula based on SCr of 0.75 mg/dL). Liver Function Tests: Recent Labs  Lab 08/30/22 0445  AST 31  ALT 43  ALKPHOS 59  BILITOT 0.8  PROT 6.1*  ALBUMIN 3.0*    No results for input(s): "LIPASE", "AMYLASE" in the last 168 hours. No results for input(s): "AMMONIA" in the last 168 hours. Coagulation Profile: No results for input(s): "INR", "PROTIME" in the last 168 hours. Cardiac Enzymes: No results for input(s): "CKTOTAL", "CKMB", "CKMBINDEX", "TROPONINI" in the last 168 hours. BNP (last 3 results) No results for input(s): "PROBNP" in the last 8760 hours. HbA1C: No results for input(s): "HGBA1C" in the last 72 hours. CBG: Recent Labs  Lab 09/01/22 0709 09/01/22 1054 09/01/22 1626 09/01/22 2010 09/02/22 0723  GLUCAP 97 120* 105* 91 93    Lipid Profile: No results for input(s): "CHOL", "HDL", "LDLCALC", "TRIG", "CHOLHDL", "LDLDIRECT" in the last 72 hours. Thyroid Function Tests: No results for input(s): "TSH", "T4TOTAL", "FREET4", "T3FREE", "THYROIDAB" in the last 72 hours. Anemia Panel: No results for input(s): "VITAMINB12", "FOLATE", "FERRITIN", "TIBC", "IRON", "RETICCTPCT" in the last 72 hours. Sepsis Labs: No results for input(s): "PROCALCITON", "LATICACIDVEN" in the last 168 hours.  Recent Results (from the past 240 hour(s))  Calprotectin, Fecal     Status: Abnormal   Collection Time: 08/27/22  8:29 AM   Specimen: Stool  Result Value Ref Range Status   Calprotectin, Fecal 459 (H) 0 - 120 ug/g Final    Comment:  (NOTE) Concentration     Interpretation   Follow-Up < 5 - 50 ug/g     Normal           None >50 -120 ug/g     Borderline       Re-evaluate in 4-6 weeks    >120 ug/g     Abnormal         Repeat as clinically                                   indicated Performed At: [facility] [address] [place], [place] [id number] Jolene Schimke MD WU:[id number]   Surgical PCR screen     Status: Abnormal   Collection Time: 08/29/22  2:22 AM   Specimen: Nasal Mucosa; Nasal Swab  Result Value Ref Range Status   MRSA, PCR NEGATIVE NEGATIVE Final   Staphylococcus  aureus POSITIVE (A) NEGATIVE Final    Comment: (NOTE) The Xpert SA Assay (FDA approved for NASAL specimens in patients 54 years of age and older), is one component of a comprehensive surveillance program. It is not intended to diagnose infection nor to guide or monitor treatment. Performed at [facility], [address], [place], [place] [address]          Radiology Studies: No results found.      Scheduled Meds:  acetaminophen  1,000 mg Oral Q6H   amLODipine  5 mg Oral Daily   docusate sodium  100 mg Oral BID   enoxaparin (LOVENOX) injection  40 mg Subcutaneous Q24H   feeding supplement  237 mL Oral BID BM   insulin aspart  0-15 Units Subcutaneous TID WC   insulin aspart  0-5 Units Subcutaneous QHS   insulin aspart  3 Units Subcutaneous TID WC   levothyroxine  75 mcg Oral QAC breakfast   methocarbamol  500 mg Oral Q6H   montelukast  10 mg Oral QHS   mupirocin ointment  1 Application Nasal BID   pantoprazole  40 mg Oral Daily   sodium chloride flush  10-40 mL Intracatheter Q12H   venlafaxine XR  150 mg Oral BID   Continuous Infusions:  sodium chloride 10 mL (09/01/22 1530)   piperacillin-tazobactam (ZOSYN)  IV 3.375 g (09/02/22 0500)     LOS: 16 days    Time spent: 39 min  Alwyn Ren, MD  09/02/2022, 10:32 AM

## 2022-09-02 NOTE — Progress Notes (Signed)
RT note: Pt. refused CPAP placement this visit.

## 2022-09-03 DIAGNOSIS — K5792 Diverticulitis of intestine, part unspecified, without perforation or abscess without bleeding: Secondary | ICD-10-CM | POA: Diagnosis not present

## 2022-09-03 LAB — COMPREHENSIVE METABOLIC PANEL
ALT: 24 U/L (ref 0–44)
AST: 22 U/L (ref 15–41)
Albumin: 2.2 g/dL — ABNORMAL LOW (ref 3.5–5.0)
Alkaline Phosphatase: 72 U/L (ref 38–126)
Anion gap: 10 (ref 5–15)
BUN: 6 mg/dL — ABNORMAL LOW (ref 8–23)
CO2: 28 mmol/L (ref 22–32)
Calcium: 8.2 mg/dL — ABNORMAL LOW (ref 8.9–10.3)
Chloride: 94 mmol/L — ABNORMAL LOW (ref 98–111)
Creatinine, Ser: 0.82 mg/dL (ref 0.44–1.00)
GFR, Estimated: >60 mL/min (ref >60)
Glucose, Bld: 98 mg/dL (ref 70–99)
Potassium: 3.3 mmol/L — ABNORMAL LOW (ref 3.5–5.1)
Sodium: 132 mmol/L — ABNORMAL LOW (ref 135–145)
Total Bilirubin: 0.7 mg/dL (ref 0.3–1.2)
Total Protein: 5.1 g/dL — ABNORMAL LOW (ref 6.5–8.1)

## 2022-09-03 LAB — CBC
HCT: 29.6 % — ABNORMAL LOW (ref 36.0–46.0)
Hemoglobin: 9.2 g/dL — ABNORMAL LOW (ref 12.0–15.0)
MCH: 28.1 pg (ref 26.0–34.0)
MCHC: 31.1 g/dL (ref 30.0–36.0)
MCV: 90.5 fL (ref 80.0–100.0)
Platelets: 168 10*3/uL (ref 150–400)
RBC: 3.27 MIL/uL — ABNORMAL LOW (ref 3.87–5.11)
RDW: 17.1 % — ABNORMAL HIGH (ref 11.5–15.5)
WBC: 14.8 10*3/uL — ABNORMAL HIGH (ref 4.0–10.5)
nRBC: 0 % (ref 0.0–0.2)

## 2022-09-03 LAB — GLUCOSE, CAPILLARY
Glucose-Capillary: 89 mg/dL (ref 70–99)
Glucose-Capillary: 89 mg/dL (ref 70–99)
Glucose-Capillary: 116 mg/dL — ABNORMAL HIGH (ref 70–99)
Glucose-Capillary: 113 mg/dL — ABNORMAL HIGH (ref 70–99)

## 2022-09-03 LAB — MAGNESIUM: Magnesium: 2 mg/dL (ref 1.7–2.4)

## 2022-09-03 NOTE — Progress Notes (Signed)
5 Days Post-Op   Subjective/Chief Complaint: Problem with vac not working overnight   Objective: Vital signs in last 24 hours: Temp:  [97.5 F (36.4 C)] 97.5 F (36.4 C) (04/21 0533) Pulse Rate:  [81-94] 81 (04/21 0533) Resp:  [15-20] 20 (04/21 0533) BP: (147-148)/(56-67) 147/56 (04/21 0533) SpO2:  [95 %] 95 % (04/21 0533) Last BM Date : 09/01/22  Intake/Output from previous day: 04/20 0701 - 04/21 0700 In: 1244 [P.O.:1060; I.V.:134; IV Piggyback:50] Out: 2300 [Urine:1800; Stool:500] Intake/Output this shift: No intake/output data recorded.  General appearance: alert and cooperative Resp: clear to auscultation bilaterally Cardio: regular rate and rhythm GI: soft, moderate tenderness. Ostomy pink and productive  Lab Results:  Recent Labs    09/02/22 0553 09/03/22 0528  WBC 9.0 14.8*  HGB 9.4* 9.2*  HCT 29.9* 29.6*  PLT 153 168   BMET Recent Labs    09/02/22 0553 09/03/22 0528  NA 133* 132*  K 3.7 3.3*  CL 98 94*  CO2 29 28  GLUCOSE 87 98  BUN 9 6*  CREATININE 0.70 0.82  CALCIUM 8.0* 8.2*   PT/INR No results for input(s): "LABPROT", "INR" in the last 72 hours. ABG No results for input(s): "PHART", "HCO3" in the last 72 hours.  Invalid input(s): "PCO2", "PO2"  Studies/Results: No results found.  Anti-infectives: Anti-infectives (From admission, onward)    Start     Dose/Rate Route Frequency Ordered Stop   08/20/22 0900  piperacillin-tazobactam (ZOSYN) IVPB 3.375 g  Status:  Discontinued        3.375 g 12.5 mL/hr over 240 Minutes Intravenous Every 8 hours 08/20/22 0812 09/02/22 1040   08/20/22 0845  piperacillin-tazobactam (ZOSYN) IVPB 3.375 g  Status:  Discontinued        3.375 g 100 mL/hr over 30 Minutes Intravenous Every 6 hours 08/20/22 0758 08/20/22 0812   08/17/22 1530  ciprofloxacin (CIPRO) IVPB 400 mg  Status:  Discontinued        400 mg 200 mL/hr over 60 Minutes Intravenous Every 12 hours 08/17/22 1453 08/20/22 0758   08/17/22 1430   metroNIDAZOLE (FLAGYL) IVPB 500 mg  Status:  Discontinued        500 mg 100 mL/hr over 60 Minutes Intravenous Every 12 hours 08/17/22 1428 08/20/22 0758   08/17/22 1430  ciprofloxacin (CIPRO) IVPB 400 mg  Status:  Discontinued        400 mg 200 mL/hr over 60 Minutes Intravenous  Once 08/17/22 1428 08/17/22 1453       Assessment/Plan: s/p Procedure(s): EXPLORATORY LAPAROTOMY, EXTENDED LEFT COLECTOMY WITH MOBILIZATION SPLENIC FLEXURE, TRANSVERSE COLOSTOMY (N/A) Advance diet Will d/c vac today and do wet to dry dressing changes. Will get WOC nurse to reevaluate tomorrow Wbc up to 14. Will recheck tomorrow. If still elevated then she may need CT Exacerbation of colitis with probable stricture  -POD#5 s/p EXPLORATORY LAPAROTOMY, EXTENDED LEFT COLECTOMY WITH MOBILIZATION SPLENIC FLEXURE, TRANSVERSE COLOSTOMY 4/16 Dr. Carolynne Edouard - surgical path: benign stricture - ostomy productive, advance to fulls and as tolerated to soft diet. Try Ensure. Replace hypokalemia and check mag level - continue mobilizing, PT/OT - recommending HH therapies which has been ordered - d/c PCA. Schedule tylenol and robaxin. PRN tramadol and fentanyl for breakthrough pain - WOC following for new ostomy education. Vac MWF - home vac/RN approved - discussed with Dr. Marina Goodell 4/17 regarding steroids and ok to stop without taper   ID - zosyn (plan to continue 4-5 days postop) VTE - SCDs, start lovenox and check CBC  in AM FEN - SLIV, FLD ADAT to soft Foley - out 4/17 and voiding   Acute blood loss anemia - Hgb 9.3 from 9.1, stable HTN Hypothyroidism HLD Anxiety and depression  LOS: 17 days    Whitney Higgins 09/03/2022

## 2022-09-03 NOTE — Progress Notes (Signed)
Patient refuses CPAP per previous note

## 2022-09-03 NOTE — Progress Notes (Signed)
Physical Therapy Treatment Patient Details Name: Whitney Higgins MRN: [id number] DOB: Mar 02, 1956 Today's Date: 09/03/2022   History of Present Illness 67 yo female referred to ED on 08/17/2022 from OP GI due to abdominal pain with some nausea, episodes of emesis and diarrhea over the past 2 months and abn abdominal CT. Pt has PMH including but not limited to: anxiety, depression, OA, asthma, chronic LBP s/p lumbar laminectomy/decompression, gallstones, pituitary tumor, HTN, HDL, diverticulitis, Chohns colitis and IBS  Pt underwent L colectomy with transverse colostomy on 4/16 and wound vac  and now has orders for acute therapy intervention.    PT Comments    Pt continues cooperative and progressing steadily with mobility including increased distance ambulated with improved stability.  Pt continues to struggle with bed mobility relying on bed rails and HOB elevation to assist.  Recommendations for follow up therapy are one component of a multi-disciplinary discharge planning process, led by the attending physician.  Recommendations may be updated based on patient status, additional functional criteria and insurance authorization.  Follow Up Recommendations       Assistance Recommended at Discharge Frequent or constant Supervision/Assistance  Patient can return home with the following A little help with walking and/or transfers;A little help with bathing/dressing/bathroom;Assistance with cooking/housework;Assist for transportation;Help with stairs or ramp for entrance   Equipment Recommendations  Rolling walker (2 wheels)    Recommendations for Other Services       Precautions / Restrictions Precautions Precautions: Fall Precaution Comments: VAC  , colostomy Restrictions Weight Bearing Restrictions: No     Mobility  Bed Mobility Overal bed mobility: Needs Assistance Bed Mobility: Sit to Supine       Sit to supine: Min assist   General bed mobility comments: HOB slightly elevated,  assist required for LEs and use of bed rails    Transfers Overall transfer level: Needs assistance Equipment used: Rolling walker (2 wheels) Transfers: Sit to/from Stand Sit to Stand: Min guard                Ambulation/Gait Ambulation/Gait assistance: Supervision Gait Distance (Feet): 250 Feet Assistive device: Rolling walker (2 wheels) Gait Pattern/deviations: Step-through pattern, Wide base of support Gait velocity: decreased     General Gait Details: cues for posture and position from [employer]             Wheelchair Mobility    Modified Rankin (Stroke Patients Only)       Balance Overall balance assessment: Needs assistance Sitting-balance support: Feet supported Sitting balance-Leahy Scale: Good     Standing balance support: No upper extremity supported Standing balance-Leahy Scale: Fair                              Cognition Arousal/Alertness: Awake/alert Behavior During Therapy: WFL for tasks assessed/performed Overall Cognitive Status: Within Functional Limits for tasks assessed                                          Exercises      General Comments        Pertinent Vitals/Pain Pain Assessment Pain Assessment: 0-10 Pain Score: 5  Pain Location: abdomen Pain Descriptors / Indicators: Grimacing, Guarding, Heaviness Pain Intervention(s): Limited activity within patient's tolerance, Monitored during session, Premedicated before session    Home Living  Prior Function            PT Goals (current goals can now be found in the care plan section) Acute Rehab PT Goals Patient Stated Goal: STG: to be able to use the bathroom; LTG be able to attend grandchildrens sporting events PT Goal Formulation: With patient Time For Goal Achievement: 09/20/22 Potential to Achieve Goals: Good Progress towards PT goals: Progressing toward goals    Frequency    Min  1X/week      PT Plan Current plan remains appropriate    Co-evaluation              AM-PAC PT "6 Clicks" Mobility   Outcome Measure  Help needed turning from your back to your side while in a flat bed without using bedrails?: A Little Help needed moving from lying on your back to sitting on the side of a flat bed without using bedrails?: A Little Help needed moving to and from a bed to a chair (including a wheelchair)?: A Little Help needed standing up from a chair using your arms (e.g., wheelchair or bedside chair)?: A Little Help needed to walk in hospital room?: A Little Help needed climbing 3-5 steps with a railing? : Total 6 Click Score: 16    End of Session Equipment Utilized During Treatment: Gait belt Activity Tolerance: Patient tolerated treatment well Patient left: in bed;with call bell/phone within reach Nurse Communication: Mobility status;Other (comment) PT Visit Diagnosis: Unsteadiness on feet (R26.81);Muscle weakness (generalized) (M62.81);Pain     Time: 1426-1450 PT Time Calculation (min) (ACUTE ONLY): 24 min  Charges:  $Gait Training: 8-22 mins                     Whitney Higgins PT Acute Rehabilitation Services Pager [phone] Office [phone]    Garland Hincapie 09/03/2022, 4:04 PM

## 2022-09-03 NOTE — Progress Notes (Signed)
PROGRESS NOTE    Whitney Higgins  RUE:[id number] DOB: 1955-06-21 DOA: 08/17/2022 PCP: Montez Hageman, DO   Brief Narrative: 67 year old white female with history of Crohn's colitis, previously managed with Lialda presenting with 35-month history of progressive symptoms and on admission imaging found to have a severe inflammatory process of the sigmoid colon-unclear whether this represented severe diverticulitis versus severe Crohn's colitis with both with associated phlegmon, possible early abscess and on most recent imaging has evidence of severe stricture of the sigmoid.   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 Oob ambulate Advancing diet as tolerated per general surgery With leukocytosis follow-up labs in a.m. may need repeat CT of the abdomen and pelvis   Hyperglycemia-CBG (last 3)  No previous history of DM 2, likely secondary to steroid use Hemoglobin A1c is 6.4 Continue SSI ACHS CBG (last 3)  Recent Labs    09/02/22 1655 09/02/22 2059 09/03/22 0729  GLUCAP 76 89 89    Anxiety and depression Continue venlafaxine   HTN -continue amlodipine hydrochlorothiazide and lisinopril.    Hypothyroid Continue synthroid and Cytomel   Asthma-encourage incentive spirometry  Acute blood loss anemia -hemoglobin 9.4 from 10.9 from 12.9  yesterday Stable  Estimated body mass index is 46.37 kg/m as calculated from the following:   Height as of this encounter: 5\' 4"  (1.626 m).   Weight as of this encounter: 122.5 kg.  DVT prophylaxis: Lovenox Code Status: Full code Family Communication: Husband at bedside Disposition Plan:  Status is: Inpatient Remains inpatient appropriate because: Acute diverticulitis status post colectomy   Consultants:  General surgery during GI  Procedures: Exploratory laparotomy left colectomy transverse colostomy 08/29/2022 Antimicrobials: Zosyn  Subjective: Resting in bed Wound VAC in place Trying to eat some breakfast objective: Vitals:   09/02/22 0410 09/02/22 2057 09/03/22 0533 09/03/22 0911  BP: (!) 176/82 (!) 148/67 (!) 147/56 (!) 154/96  Pulse: 87 94 81 89  Resp: 20 15 20    Temp: 98.1 F (36.7 C)  (!) 97.5 F (36.4 C)   TempSrc: Oral     SpO2: 92% 95% 95%   Weight:      Height:        Intake/Output Summary (Last 24 hours) at 09/03/2022 1259 Last data filed at 09/03/2022 0600 Gross per 24 hour  Intake 1009.35 ml  Output 1650 ml  Net -640.65 ml    Filed Weights   08/17/22 1744  Weight: 122.5 kg    Examination:  General exam: Appears in no acute distress Respiratory system: Clear to auscultation. Respiratory effort normal. Cardiovascular system: S1 & S2 heard, RRR. No JVD, murmurs, rubs, gallops or clicks. No pedal edema. Gastrointestinal system: Incision forward with dressing  Colostomy with stool wound VAC in place Central nervous system: Alert and oriented. No focal neurological deficits. Extremities: trace edema.   Data Reviewed: I have  personally reviewed following labs and imaging studies  CBC: Recent Labs  Lab 08/30/22 0445 08/31/22 0454 09/01/22 0435 09/02/22 0553 09/03/22 0528  WBC 15.0* 12.0* 9.9 9.0 14.8*  HGB 10.9* 9.1* 9.3* 9.4* 9.2*  HCT 34.9* 28.9* 29.7* 29.9* 29.6*  MCV 91.6 91.2 92.0 90.9 90.5  PLT 213 156 147* 153 168    Basic  Metabolic Panel: Recent Labs  Lab 08/30/22 0445 08/31/22 0454 09/01/22 0430 09/01/22 0435 09/02/22 0553 09/03/22 0528  NA 137 136  --  132* 133* 132*  K 3.8 3.6  --  3.0* 3.7 3.3*  CL 100 100  --  95* 98 94*  CO2 29 29  --  GLUCOSE 153* 84  --  96 87 98  BUN 18 12  --  8 9 6*  CREATININE 0.89 0.76  --  0.75 0.70 0.82  CALCIUM 8.3* 8.3*  --  7.9* 8.0* 8.2*  MG  --   --  2.2  --   --   --     GFR: Estimated Creatinine Clearance: 84.8 mL/min (by C-G formula based on SCr of 0.82 mg/dL). Liver Function Tests: Recent Labs  Lab 08/30/22 0445 09/03/22 0528  AST 31 22  ALT 43 24  ALKPHOS 59 72  BILITOT 0.8 0.7  PROT 6.1* 5.1*  ALBUMIN 3.0* 2.2*    No results for input(s): "LIPASE", "AMYLASE" in the last 168 hours. No results for input(s): "AMMONIA" in the last 168 hours. Coagulation Profile: No results for input(s): "INR", "PROTIME" in the last 168 hours. Cardiac Enzymes: No results for input(s): "CKTOTAL", "CKMB", "CKMBINDEX", "TROPONINI" in the last 168 hours. BNP (last 3 results) No results for input(s): "PROBNP" in the last 8760 hours. HbA1C: No results for input(s): "HGBA1C" in the last 72 hours. CBG: Recent Labs  Lab 09/02/22 0723 09/02/22 1206 09/02/22 1655 09/02/22 2059 09/03/22 0729  GLUCAP 93 90 76 89 89    Lipid Profile: No results for input(s): "CHOL", "HDL", "LDLCALC", "TRIG", "CHOLHDL", "LDLDIRECT" in the last 72 hours. Thyroid Function Tests: No results for input(s): "TSH", "T4TOTAL", "FREET4", "T3FREE", "THYROIDAB" in the last 72 hours. Anemia Panel: No results for input(s): "VITAMINB12", "FOLATE", "FERRITIN", "TIBC", "IRON", "RETICCTPCT" in the last 72 hours. Sepsis Labs: No results for input(s): "PROCALCITON", "LATICACIDVEN" in the last 168 hours.  Recent Results (from the past 240 hour(s))  Calprotectin, Fecal     Status: Abnormal   Collection Time: 08/27/22  8:29 AM   Specimen: Stool  Result Value Ref Range Status    Calprotectin, Fecal 459 (H) 0 - 120 ug/g Final    Comment: (NOTE) Concentration     Interpretation   Follow-Up < 5 - 50 ug/g     Normal           None >50 -120 ug/g     Borderline       Re-evaluate in 4-6 weeks    >120 ug/g     Abnormal         Repeat as clinically                                   indicated Performed At: [facility] [address] [place], [place] [id number] Jolene Schimke MD [id number]   Surgical PCR screen     Status: Abnormal   Collection Time: 08/29/22  2:22 AM   Specimen: Nasal Mucosa; Nasal Swab  Result Value Ref Range  Status   MRSA, PCR NEGATIVE NEGATIVE Final   Staphylococcus aureus POSITIVE (A) NEGATIVE Final    Comment: (NOTE) The Xpert SA Assay (FDA approved for NASAL specimens in patients 46 years of age and older), is one component of a comprehensive surveillance program. It is not intended to diagnose infection nor to guide or monitor treatment. Performed at [facility], [address], [place], [place] [address]          Radiology Studies: No results found.      Scheduled Meds:  acetaminophen  1,000 mg Oral Q6H   amLODipine  5 mg Oral Daily   atorvastatin  20 mg Oral Daily   docusate sodium  100 mg Oral BID   enoxaparin (LOVENOX) injection  40 mg Subcutaneous Q24H   feeding supplement  237 mL Oral BID BM   lisinopril  20 mg Oral Daily   And   hydrochlorothiazide  25 mg Oral Daily   insulin aspart  0-15 Units Subcutaneous TID WC   insulin aspart  0-5 Units Subcutaneous QHS   insulin aspart  3 Units Subcutaneous TID WC   levothyroxine  75 mcg Oral QAC breakfast   liothyronine  5 mcg Oral Daily   loratadine  10 mg Oral Daily   methocarbamol  500 mg Oral Q6H   montelukast  10 mg Oral QHS   pantoprazole  40 mg Oral Daily   sodium chloride flush  10-40 mL Intracatheter Q12H   venlafaxine XR  150 mg Oral BID   Continuous Infusions:  sodium chloride 10 mL (09/01/22 1530)     LOS: 17 days     Time spent: 39 min  Alwyn Ren, MD  09/03/2022, 12:59 PM

## 2022-09-03 NOTE — Progress Notes (Signed)
RN removed wound vac and applied wet to dry dressing to abdominal incision as requested by provider. Colostomy bag changed also.

## 2022-09-04 ENCOUNTER — Telehealth: Payer: Self-pay

## 2022-09-04 DIAGNOSIS — K5792 Diverticulitis of intestine, part unspecified, without perforation or abscess without bleeding: Secondary | ICD-10-CM | POA: Diagnosis not present

## 2022-09-04 LAB — COMPREHENSIVE METABOLIC PANEL
ALT: 18 U/L (ref 0–44)
AST: 19 U/L (ref 15–41)
Albumin: 2.4 g/dL — ABNORMAL LOW (ref 3.5–5.0)
Alkaline Phosphatase: 80 U/L (ref 38–126)
Anion gap: 8 (ref 5–15)
BUN: 10 mg/dL (ref 8–23)
CO2: 30 mmol/L (ref 22–32)
Calcium: 8.5 mg/dL — ABNORMAL LOW (ref 8.9–10.3)
Chloride: 94 mmol/L — ABNORMAL LOW (ref 98–111)
Creatinine, Ser: 0.87 mg/dL (ref 0.44–1.00)
GFR, Estimated: >60 mL/min (ref >60)
Glucose, Bld: 123 mg/dL — ABNORMAL HIGH (ref 70–99)
Potassium: 3 mmol/L — ABNORMAL LOW (ref 3.5–5.1)
Sodium: 132 mmol/L — ABNORMAL LOW (ref 135–145)
Total Bilirubin: 0.4 mg/dL (ref 0.3–1.2)
Total Protein: 5.7 g/dL — ABNORMAL LOW (ref 6.5–8.1)

## 2022-09-04 LAB — GLUCOSE, CAPILLARY
Glucose-Capillary: 112 mg/dL — ABNORMAL HIGH (ref 70–99)
Glucose-Capillary: 95 mg/dL (ref 70–99)

## 2022-09-04 LAB — CBC
HCT: 30.6 % — ABNORMAL LOW (ref 36.0–46.0)
Hemoglobin: 9.6 g/dL — ABNORMAL LOW (ref 12.0–15.0)
MCH: 28.2 pg (ref 26.0–34.0)
MCHC: 31.4 g/dL (ref 30.0–36.0)
MCV: 90 fL (ref 80.0–100.0)
Platelets: 185 10*3/uL (ref 150–400)
RBC: 3.4 MIL/uL — ABNORMAL LOW (ref 3.87–5.11)
RDW: 17.1 % — ABNORMAL HIGH (ref 11.5–15.5)
WBC: 9.3 10*3/uL (ref 4.0–10.5)
nRBC: 0 % (ref 0.0–0.2)

## 2022-09-04 MED ORDER — METHOCARBAMOL 500 MG PO TABS: 500.0000 mg | ORAL_TABLET | Freq: Four times a day (QID) | ORAL | 0 refills | Status: AC | PRN

## 2022-09-04 MED ORDER — TRAMADOL HCL 50 MG PO TABS: 50.0000 mg | ORAL_TABLET | Freq: Four times a day (QID) | ORAL | 0 refills | Status: DC | PRN

## 2022-09-04 MED ORDER — ONDANSETRON HCL 4 MG PO TABS: 4.0000 mg | ORAL_TABLET | Freq: Four times a day (QID) | ORAL | 0 refills | Status: DC | PRN

## 2022-09-04 NOTE — Consult Note (Signed)
WOC Nurse wound follow up Wound type: surgical  Measurement: 16cm x 4cm x 5cm  Wound bed: false bottom identified by CCS PA this am, I have opened today as well to place NPWT sponge in deepest portion, clean, subcutaneous tissue Drainage (amount, consistency, odor) serosanguinous on dressing Periwound: intact, colostomy left lateral  Dressing procedure/placement/frequency: Removed old dressing (saline gauze) Filled wound with  _2__ piece of black foam Sealed NPWT dressing at HG Patient received PO pain medication per bedside nurse prior to dressing change Patient tolerated procedure well Deepest portion of wound bed at distal aspect; filled this with one piece of foam dressing Used small piece of ostomy barrier ring, shaped into small strip to fill creasing on each side of the wound bed just above the umbilicus.  Brought this to the attention of the husband so that he will recognize the needs for this possibly in the home for the [facility].   WOC nurse will continue to provide NPWT dressing changed due to the complexity of the dressing change.   HHRN to change M/W/F; may need barrier ring for creasing. Bedside nursing to hook to home unit when it arrives. Will need continued support for ostomy care at home, limited education in the hospital.   WOC Nurse ostomy follow up Stoma type/location: LLQ, end colostomy  Output liquid brown Ostomy pouching: 1pc.soft convex in place, has been recently changed this am prior to my arrival  Education provided:  Demonstrated using pattern to cut to fit the new skin barrier Demonstrated placement of barrier ring and pouch; mock with samples at the bedside Patient is independent with lock and roll closure  I reiterated cleansing of the spout with wick and importance of this Enrolled patient in [place] Secure Start Discharge program: Yes  WOC Nurse will follow along with you for continued support with ostomy teaching and care Eldar Robitaille [facility] MSN, RN,  [place], CNS, [place] [phone]

## 2022-09-04 NOTE — Plan of Care (Signed)
Patient is stable for discharge. Discharge instructions have been given. All questions were answered, patient is discharged to home with spouse.  

## 2022-09-04 NOTE — Telephone Encounter (Signed)
-----   Message from Sammuel Cooper, PA-C sent at 08/30/2022  3:37 PM EDT ----- Regarding: office follow up Patient is now postop extended left hemicolectomy and colostomy done yesterday  Jeannett Senior, if you could get her a follow-up with Dr. Leone Payor in about 8 weeks that would be great-she will be in the hospital for the next 4 to 5 days, okay to contact her next week, or her husband with an appointment time thank you

## 2022-09-04 NOTE — Plan of Care (Signed)
  Problem: Nutrition: Goal: Adequate nutrition will be maintained Outcome: Progressing   Problem: Pain Managment: Goal: General experience of comfort will improve Outcome: Progressing   

## 2022-09-04 NOTE — TOC Progression Note (Addendum)
Transition of Care [facility]) - Progression Note    Patient Details  Name: Whitney Higgins MRN: [id number] Date of Birth: 1955/11/22  Transition of Care [facility]) CM/SW Contact  Geni Bers, RN Phone Number: 09/04/2022, 9:56 AM  Clinical Narrative:    Linton Flemings was called left VM that pt would discharge today and Cheryl with [facility], [facility] was called to make aware of D/C. Tracey with [facility], returned call, will come with wound vac after lunch.    Expected Discharge Plan: Home w Home Health Services Barriers to Discharge: No Barriers Identified  Expected Discharge Plan and Services   Discharge Planning Services: CM Consult Post Acute Care Choice: Durable Medical Equipment, Home Health Living arrangements for the past 2 months: Single Family Home                 DME Arranged: Negative pressure wound device, Walker rolling DME Agency: [facility], [facility] Date DME Agency Contacted: 08/31/22 Time DME Agency Contacted: 1156 Representative spoke with at DME Agency: French Ana with [employer] HH Arranged: PT, OT, RN HH Agency: [facility] Date [facility] Agency Contacted: 08/31/22 Time HH Agency Contacted: 1157 Representative spoke with at [facility] Agency: cheryl rose   Social Determinants of Health (SDOH) Interventions SDOH Screenings   Food Insecurity: No Food Insecurity (08/17/2022)  Housing: Low Risk  (08/17/2022)  Transportation Needs: No Transportation Needs (08/17/2022)  Utilities: Not At Risk (08/17/2022)  Tobacco Use: Low Risk  (08/30/2022)    Readmission Risk Interventions    08/18/2022    9:28 AM  Readmission Risk Prevention Plan  Post Dischage Appt Complete  Medication Screening Complete  Transportation Screening Complete

## 2022-09-04 NOTE — Progress Notes (Cosign Needed Addendum)
6 Days Post-Op   Subjective/Chief Complaint: No acute issues.  Tolerating some diet, but also Ensure.  Bag is working well.  Mobilizing.  Pain well controlled   Objective: Vital signs in last 24 hours: Temp:  [97.7 F (36.5 C)-98.3 F (36.8 C)] 97.7 F (36.5 C) (04/22 0552) Pulse Rate:  [83-94] 91 (04/22 0920) Resp:  [17-18] 17 (04/22 0920) BP: (126-156)/(66-83) 138/83 (04/22 0920) SpO2:  [93 %-98 %] 98 % (04/22 0920) Last BM Date : 09/03/22  Intake/Output from previous day: 04/21 0701 - 04/22 0700 In: 730 [P.O.:720; I.V.:10] Out: 400 [Stool:400] Intake/Output this shift: Total I/O In: -  Out: 10 [Stool:10]  General appearance: alert and cooperative Resp: clear to auscultation bilaterally Cardio: regular rate and rhythm GI: soft, moderate tenderness. Ostomy pink and productive .  Midline wound is clean with a small false bottom that was opened up.    Lab Results:  Recent Labs    09/03/22 0528 09/04/22 0458  WBC 14.8* 9.3  HGB 9.2* 9.6*  HCT 29.6* 30.6*  PLT 168 185   BMET Recent Labs    09/03/22 0528 09/04/22 0458  NA 132* 132*  K 3.3* 3.0*  CL 94* 94*  CO2 28 30  GLUCOSE 98 123*  BUN 6* 10  CREATININE 0.82 0.87  CALCIUM 8.2* 8.5*   PT/INR No results for input(s): "LABPROT", "INR" in the last 72 hours. ABG No results for input(s): "PHART", "HCO3" in the last 72 hours.  Invalid input(s): "PCO2", "PO2"  Studies/Results: No results found.  Anti-infectives: Anti-infectives (From admission, onward)    Start     Dose/Rate Route Frequency Ordered Stop   08/20/22 0900  piperacillin-tazobactam (ZOSYN) IVPB 3.375 g  Status:  Discontinued        3.375 g 12.5 mL/hr over 240 Minutes Intravenous Every 8 hours 08/20/22 0812 09/02/22 1040   08/20/22 0845  piperacillin-tazobactam (ZOSYN) IVPB 3.375 g  Status:  Discontinued        3.375 g 100 mL/hr over 30 Minutes Intravenous Every 6 hours 08/20/22 0758 08/20/22 0812   08/17/22 1530  ciprofloxacin (CIPRO)  IVPB 400 mg  Status:  Discontinued        400 mg 200 mL/hr over 60 Minutes Intravenous Every 12 hours 08/17/22 1453 08/20/22 0758   08/17/22 1430  metroNIDAZOLE (FLAGYL) IVPB 500 mg  Status:  Discontinued        500 mg 100 mL/hr over 60 Minutes Intravenous Every 12 hours 08/17/22 1428 08/20/22 0758   08/17/22 1430  ciprofloxacin (CIPRO) IVPB 400 mg  Status:  Discontinued        400 mg 200 mL/hr over 60 Minutes Intravenous  Once 08/17/22 1428 08/17/22 1453       Assessment/Plan: 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 Carolynne Edouard - surgical path: benign stricture - ostomy productive, tolerating to soft diet. Tolerating Ensure.  - continue mobilizing, PT/OT - recommending HH therapies which has been ordered - Schedule tylenol and robaxin. PRN tramadol  - WOC following for new ostomy education. Vac MWF - home vac/RN approved.  VAC to be replaced today prior to DC - discussed with primary team, ok for DC home today from our standpoint.  Follow up arranged   ID - zosyn (plan to continue 4-5 days postop) VTE - SCDs, start lovenox and check CBC in AM FEN - SLIV, soft, ensure Foley - out 4/17 and voiding   Acute  blood loss anemia - Hgb 9.3 from 9.1, stable HTN Hypothyroidism HLD Anxiety and depression  LOS: 18 days    Letha Cape 09/04/2022

## 2022-09-04 NOTE — Discharge Summary (Signed)
Physician Discharge Summary  Whitney Higgins VZD:[id number] DOB: Apr 21, 1956 DOA: 08/17/2022  PCP: Montez Hageman, DO  Admit date: 08/17/2022 Discharge date: 09/04/2022  Admitted From: Home Disposition: Home Recommendations for Outpatient Follow-up:  Follow up with PCP in 1-2 weeks Please obtain BMP/CBC in one week Please follow up with general surgery  Home Health: Yes Equipment/Devices: Wound VAC Discharge Condition: Stable CODE STATUS: Full Diet recommendation: Cardiac Brief/Interim Summary:  67 year old white female with history of Crohn's colitis, previously managed with Lialda presenting with 56-month history of progressive symptoms and on admission imaging found to have a severe inflammatory process of the sigmoid colon-unclear whether this represented severe diverticulitis versus severe Crohn's colitis with both with associated phlegmon, possible early abscess and on most recent imaging has evidence of severe stricture of the sigmoid    Discharge Diagnoses:  Principal Problem:   Acute diverticulitis Active Problems:   Crohn's colitis (HCC) - left-sided   Obesity, Class III, BMI 40-49.9 (morbid obesity)   Acquired hypothyroidism   Asthma   Chronic anxiety   Essential hypertension   Hypercholesterolemia   Major depressive disorder   Abnormal alkaline phosphatase test   Transaminitis   Abnormal CT scan, colon   Abdominal pain, left lower quadrant   RLQ abdominal pain   Abnormal abdominal CT scan      Acute diverticulitis with phlegmon Crohn's colitis with phlegmon-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  She was treated with Zosyn and Solu-Medrol.  She received Zosyn for 13 days with improvement in her white count and her white count was normal on discharge.  She tolerated some diet and Ensure and ambulated prior to discharge.  She will follow-up with general surgery and Dr. Leone Payor. Home wound VAC was arranged on discharge for Monday Wednesday  Friday wound VAC change.  The wound VAC was replaced on the day of discharge.  Follow-up with general surgery.   Anxiety and depression Continue venlafaxine   HTN -continue amlodipine hydrochlorothiazide and lisinopril.     Hypothyroid Continue synthroid and Cytomel   Asthma-stable   acute blood loss anemia -hemoglobin 9.4 from 10.9 from 12.9 yesterday Stable   Estimated body mass index is 46.37 kg/m as calculated from the following:   Height as of this encounter: 5\' 4"  (1.626 m).   Weight as of this encounter: 122.5 kg.  Discharge Instructions  Discharge Instructions     Amb Referral to Ostomy Clinic   Complete by: As directed    S/p ex lap extended Left colectomy with transverse colostomy   Reason for referral modifiers:  Information about lifestyle, intimacy, diet and clothing options Pre and post-operative counseling for ostomy management     Diet - low sodium heart healthy   Complete by: As directed    Discharge wound care:   Complete by: As directed    See orders   Increase activity slowly   Complete by: As directed       Allergies as of 09/04/2022       Reactions   Codeine Nausea And Vomiting   Clarithromycin Rash   Dicyclomine Hcl    Dizzy, and headache   Penicillins Rash        Medication List     STOP taking these medications    amLODipine 5 MG tablet Commonly known as: NORVASC       TAKE these medications    acetaminophen 500 MG tablet Commonly known as: TYLENOL Take 1,000 mg by mouth every 6 (six) hours as needed  for pain.   albuterol (2.5 MG/3ML) 0.083% nebulizer solution Commonly known as: PROVENTIL Take 2.5 mg by nebulization every 4 (four) hours as needed for wheezing or shortness of breath.   albuterol 108 (90 Base) MCG/ACT inhaler Commonly known as: VENTOLIN HFA Inhale 2 puffs into the lungs every 6 (six) hours as needed for wheezing.   ALPRAZolam 0.5 MG tablet Commonly known as: XANAX Take 0.5 mg by mouth at bedtime as  needed for sleep.   atorvastatin 20 MG tablet Commonly known as: LIPITOR Take 20 mg by mouth daily.   B12 LIQUID HEALTH BOOSTER PO Take 5,000 mcg by mouth daily.   cetirizine 10 MG tablet Commonly known as: ZYRTEC Take 10 mg by mouth daily.   fluticasone 50 MCG/ACT nasal spray Commonly known as: FLONASE Place 1 spray into both nostrils daily as needed for allergies or rhinitis.   hyoscyamine 0.125 MG SL tablet Commonly known as: LEVSIN SL Place 1 tablet (0.125 mg total) under the tongue every 4 (four) hours as needed. What changed: reasons to take this   levothyroxine 75 MCG tablet Commonly known as: SYNTHROID Take 75 mcg by mouth daily.   Lialda 1.2 g EC tablet Generic drug: mesalamine TAKE TWO TABLETS BY MOUTH TWICE A DAY What changed: how much to take   liothyronine 5 MCG tablet Commonly known as: CYTOMEL Take 5 mcg by mouth daily.   lisinopril-hydrochlorothiazide 20-25 MG tablet Commonly known as: ZESTORETIC Take 1 tablet by mouth daily.   methocarbamol 500 MG tablet Commonly known as: ROBAXIN Take 1 tablet (500 mg total) by mouth every 6 (six) hours as needed for muscle spasms.   montelukast 10 MG tablet Commonly known as: SINGULAIR Take 10 mg by mouth daily.   naproxen 500 MG tablet Commonly known as: NAPROSYN Take 500 mg by mouth daily as needed for moderate pain.   ondansetron 4 MG tablet Commonly known as: ZOFRAN Take 1 tablet (4 mg total) by mouth every 8 (eight) hours as needed for nausea or vomiting. What changed: Another medication with the same name was added. Make sure you understand how and when to take each.   ondansetron 4 MG tablet Commonly known as: ZOFRAN Take 1 tablet (4 mg total) by mouth every 6 (six) hours as needed for nausea. What changed: You were already taking a medication with the same name, and this prescription was added. Make sure you understand how and when to take each.   potassium chloride 10 MEQ tablet Commonly known  as: KLOR-CON Take 10 mEq by mouth daily.   traMADol 50 MG tablet Commonly known as: ULTRAM Take 1-2 tablets (50-100 mg total) by mouth every 6 (six) hours as needed for moderate pain or severe pain (50mg  mdoerate, 100mg  severe).   venlafaxine XR 150 MG 24 hr capsule Commonly known as: EFFEXOR-XR Take 150 mg by mouth 2 (two) times daily.   Vitamin D (Ergocalciferol) 1.25 MG (50000 UNIT) Caps capsule Commonly known as: DRISDOL Take 50,000 Units by mouth every 7 (seven) days.               Durable Medical Equipment  (From admission, onward)           Start     Ordered   08/31/22 1201  For home use only DME Walker rolling  Once       Question Answer Comment  Walker: With 5 Inch Wheels   Patient needs a walker to treat with the following condition Weakness  08/31/22 1201   08/31/22 1117  For home use only DME Negative pressure wound device  Once       Question Answer Comment  Frequency of dressing change 3 times per week   Length of need 3 Months   Dressing type Foam   Amount of suction 125 mm/Hg   Pressure application Continuous pressure   Supplies 10 canisters and 15 dressings per month for duration of therapy      08/31/22 1116              Discharge Care Instructions  (From admission, onward)           Start     Ordered   09/04/22 0000  Discharge wound care:       Comments: See orders   09/04/22 1020            Follow-up Information     [facility] Follow up.   Why: agency will  provide home health PT/OT/RN.  Becky Sax [phone] is point of contact. Contact information: [address] [place] [address] [phone]         Griselda Miner, MD. Go on 09/20/2022.   Specialty: General Surgery Why: Your appointment is 5/8 arrive at 1:00pm for 1:30pm appointment to check in and fill out paperwork, Bring photo ID and insurance information Contact information: [address] [place]  [phone]         Montez Hageman, DO Follow up.   Specialty: Family Medicine Contact information: [phone] [place] [address] [phone]                Allergies  Allergen Reactions   Codeine Nausea And Vomiting   Clarithromycin Rash   Dicyclomine Hcl     Dizzy, and headache   Penicillins Rash    Consultations: Dr. Leone Payor General surgery   Procedures/Studies: DG Abd 2 Views  Result Date: 08/28/2022 CLINICAL DATA:  Assess stool burden.  Pain EXAM: ABDOMEN - 2 VIEW COMPARISON:  CT 08/25/2022 and older FINDINGS: There is presumed contrast scattered throughout the nondilated colon and some nondilated loops of small bowel with air as well. Mild colonic stool suggested. No obstruction. No definite free air on these supine radiographs. Surgical clips in the right upper quadrant. Right hip arthroplasty. Curvature and degenerative changes of the spine. IMPRESSION: Nonspecific bowel gas pattern with diffuse bowel contrast. Mild scattered colonic stool Electronically Signed   By: Karen Kays M.D.   On: 08/28/2022 12:58   CT ABDOMEN PELVIS W CONTRAST  Addendum Date: 08/25/2022   ADDENDUM REPORT: 08/25/2022 12:28 ADDENDUM: Increased amount of stool is noted in the right and transverse colon suggesting that the inflammatory process involving the descending and sigmoid colon is resulting in probable partial obstruction. Electronically Signed   By: Lupita Raider M.D.   On: 08/25/2022 12:28   Result Date: 08/25/2022 CLINICAL DATA:  Left lower quadrant abdominal pain. EXAM: CT ABDOMEN AND PELVIS WITH CONTRAST TECHNIQUE: Multidetector CT imaging of the abdomen and pelvis was performed using the standard protocol following bolus administration of intravenous contrast. RADIATION DOSE REDUCTION: This exam was performed according to the departmental dose-optimization program which includes automated exposure control, adjustment of the mA and/or kV according to  patient size and/or use of iterative reconstruction technique. CONTRAST:  OMNIPAQUE IOHEXOL 300 MG/ML  SOLN COMPARISON:  August 20, 2022. FINDINGS: Lower chest: No acute abnormality. Hepatobiliary: No focal liver abnormality  is seen. Status post cholecystectomy. No biliary dilatation. Pancreas: Unremarkable. No pancreatic ductal dilatation or surrounding inflammatory changes. Spleen: Normal in size without focal abnormality. Adrenals/Urinary Tract: Adrenal glands are unremarkable. Kidneys are normal, without renal calculi, focal lesion, or hydronephrosis. Bladder is unremarkable. Stomach/Bowel: Status post appendectomy. The stomach is unremarkable. Small bowel dilatation is noted. There is again noted moderate to severe wall thickening involving the proximal sigmoid colon, as well as moderate wall thickening of descending colon. This is concerning for diverticulitis or other infectious or inflammatory colitis. Stable finding is seen in the adjacent peritoneal fat consistent with associated phlegmon or possibly early abscess. There is also noted a severe narrowing involving the distal sigmoid colon with apple-core appearance best seen on image number 97 of series 5. Malignancy cannot be excluded and sigmoidoscopy is recommended for further evaluation. Vascular/Lymphatic: Aortic atherosclerosis. No enlarged abdominal or pelvic lymph nodes. Reproductive: Uterus and bilateral adnexa are unremarkable. Other: No abdominal wall hernia or abnormality. No abdominopelvic ascites. Musculoskeletal: Status post right total hip arthroplasty. No acute osseous abnormality is noted. IMPRESSION: There is again noted moderate to severe wall thickening involving the proximal sigmoid colon which may represent diverticulitis, although infectious or inflammatory colitis cannot be excluded. Stable findings seen in the adjacent peritoneal fat consistent with associated phlegmon or possibly early abscess. There is also now noted moderate  wall thickening involving the descending colon also concerning for infectious or inflammatory colitis. Small focal severe narrowing is seen involving the distal sigmoid colon with apple-core appearance; this may represent benign stricture, but neoplasm cannot be excluded, and sigmoidoscopy is recommended. Aortic Atherosclerosis (ICD10-I70.0). Electronically Signed: By: Lupita Raider M.D. On: 08/25/2022 12:20   CT ABDOMEN PELVIS W CONTRAST  Result Date: 08/20/2022 CLINICAL DATA:  Right lower quadrant pain. Crohn disease. Diverticulitis. EXAM: CT ABDOMEN AND PELVIS WITH CONTRAST TECHNIQUE: Multidetector CT imaging of the abdomen and pelvis was performed using the standard protocol following bolus administration of intravenous contrast. RADIATION DOSE REDUCTION: This exam was performed according to the departmental dose-optimization program which includes automated exposure control, adjustment of the mA and/or kV according to patient size and/or use of iterative reconstruction technique. CONTRAST:  OMNIPAQUE IOHEXOL 300 MG/ML  SOLN COMPARISON:  08/16/2022 FINDINGS: Lower Chest: No acute findings. Hepatobiliary:  No hepatic masses identified. Pancreas:  No mass or inflammatory changes. Spleen: Within normal limits in size and appearance. Adrenals/Urinary Tract: No suspicious masses identified. No evidence of ureteral calculi or hydronephrosis. Stomach/Bowel: Moderate wall thickening and pericolonic soft tissue stranding is again seen involving the mid sigmoid colon. A loop of distal ileum is also seen at this site in the central pelvis which shows wall thickening and adjacent soft tissue stranding. Probable fistulae and spiculated mixed attenuation density are seen in the adjacent central pelvis which measures 5.0 x 2.9 cm, suspicious for phlegmon or early abscess. These findings show no significant change. Differential diagnosis includes perforated diverticulitis with diverticular abscess and secondary  involvement of small bowel, or penetrating Crohn disease with abscess and secondary involvement of sigmoid colon. No evidence of free intraperitoneal air. Vascular/Lymphatic: No pathologically enlarged lymph nodes. No acute vascular findings. Reproductive: Prior hysterectomy noted. Adnexal regions are unremarkable in appearance. Other:  None. Musculoskeletal: No suspicious bone lesions identified. Right hip prosthesis again noted. IMPRESSION: No significant change in central pelvic inflammatory process involving the sigmoid colon and distal ileum, with adjacent phlegmon or early abscess. Differential diagnosis remains perforated sigmoid diverticulitis with diverticular abscess, and penetrating Crohn disease with abscess  and secondary involvement of sigmoid colon. Electronically Signed   By: Danae Orleans M.D.   On: 08/20/2022 16:31   CT Abdomen Pelvis W Contrast  Result Date: 08/16/2022 CLINICAL DATA:  Abdominal pain. History of Crohn's disease. Evaluate for abscess. EXAM: CT ABDOMEN AND PELVIS WITH CONTRAST TECHNIQUE: Multidetector CT imaging of the abdomen and pelvis was performed using the standard protocol following bolus administration of intravenous contrast. RADIATION DOSE REDUCTION: This exam was performed according to the departmental dose-optimization program which includes automated exposure control, adjustment of the mA and/or kV according to patient size and/or use of iterative reconstruction technique. CONTRAST:  OMNIPAQUE IOHEXOL 300 MG/ML  SOLN COMPARISON:  Abdominopelvic CT 11/25/2019. FINDINGS: Lower chest: Clear lung bases. No significant pleural or pericardial effusion. Hepatobiliary: The liver is normal in density without suspicious focal abnormality. No significant biliary dilatation status post cholecystectomy. Pancreas: Unremarkable. No pancreatic ductal dilatation or surrounding inflammatory changes. Spleen: Normal in size without focal abnormality. Adrenals/Urinary Tract: Both  adrenal glands appear normal. No evidence of urinary tract calculus, suspicious renal lesion or hydronephrosis. No definite bladder abnormalities are identified. Bladder assessment limited by incomplete distension and artifact from the patient's right total hip arthroplasty. Stomach/Bowel: Enteric contrast has passed into the mid small bowel, but not traversed the terminal ileum or ileocecal valve. The stomach appears unremarkable for its degree of distention. The proximal small bowel is normal in caliber, without wall thickening or surrounding inflammation. There is mild fecalization of the distal small bowel without definite small bowel wall thickening or distention. The appendix is not visualized. There is prominent stool throughout the colon. The proximal to mid colon is normal caliber without wall thickening. However, there is moderate wall thickening throughout the sigmoid colon with surrounding inflammatory changes. Probable phlegmon in the sigmoid mesocolon measuring approximately 4.3 x 3.5 cm on image 69/2. No drainable fluid collection identified. A small fistula in this area cannot be excluded. There is no evidence of fistula to the bladder, vagina or skin. Underlying sigmoid diverticulosis as correlated with previous study. Vascular/Lymphatic: There are no enlarged abdominal or pelvic lymph nodes. Aortic and branch vessel atherosclerosis without evidence of aneurysm or large vessel occlusion. Reproductive: The uterus and ovaries appear unremarkable. No evidence of adnexal mass. Other: Stable small umbilical hernia containing only fat. As above, pelvic inflammatory changes surrounding the sigmoid colon. No ascites or free intraperitoneal air. Musculoskeletal: No acute or significant osseous findings. Multilevel thoracolumbar spondylosis. Previous right total hip arthroplasty. IMPRESSION: 1. Wall thickening of the sigmoid colon with surrounding inflammatory changes and probable phlegmon in the sigmoid  mesocolon, consistent with acute inflammation. There is underlying sigmoid diverticulosis, and findings could be secondary to diverticulitis. Given the patient's history, active Crohn's colitis not excluded. No other signs of active bowel inflammation. No drainable fluid collection identified. 2. No evidence of fistula to the bladder, vagina or skin. 3. Prominent stool throughout the colon with mild fecalization of the distal small bowel. No evidence of bowel obstruction. 4.  Aortic Atherosclerosis (ICD10-I70.0). 5. These results will be called to the ordering clinician or representative by the Radiologist Assistant, and communication documented in the PACS or [employer]. Electronically Signed   By: Carey Bullocks M.D.   On: 08/16/2022 16:31   (Echo, Carotid, EGD, Colonoscopy, ERCP)    Subjective:  Anxious to go home no new complaints white count normal had a good night Discharge Exam: Vitals:   09/04/22 0552 09/04/22 0920  BP: (!) 156/76 138/83  Pulse: 83 91  Resp: 18 17  Temp: 97.7 F (36.5 C)   SpO2: 94% 98%   Vitals:   09/03/22 1522 09/03/22 2045 09/04/22 0552 09/04/22 0920  BP: (!) 148/70 126/66 (!) 156/76 138/83  Pulse: 89 94 83 91  Resp: 18 18 18 17   Temp: 98.3 F (36.8 C) 97.9 F (36.6 C) 97.7 F (36.5 C)   TempSrc: Oral Oral Oral   SpO2: 96% 93% 94% 98%  Weight:      Height:        General: Pt is alert, awake, not in acute distress Cardiovascular: RRR, S1/S2 +, no rubs, no gallops Respiratory: CTA bilaterally, no wheezing, no rhonchi Abdominal: Soft, covered with resting colostomy with stool Extremities: no edema, no cyanosis    The results of significant diagnostics from this hospitalization (including imaging, microbiology, ancillary and laboratory) are listed below for reference.     Microbiology: Recent Results (from the past 240 hour(s))  Calprotectin, Fecal     Status: Abnormal   Collection Time: 08/27/22  8:29 AM   Specimen: Stool  Result Value  Ref Range Status   Calprotectin, Fecal 459 (H) 0 - 120 ug/g Final    Comment: (NOTE) Concentration     Interpretation   Follow-Up < 5 - 50 ug/g     Normal           None >50 -120 ug/g     Borderline       Re-evaluate in 4-6 weeks    >120 ug/g     Abnormal         Repeat as clinically                                   indicated Performed At: [facility] [address] [place], [place] [id number] Jolene Schimke MD WU:[id number]   Surgical PCR screen     Status: Abnormal   Collection Time: 08/29/22  2:22 AM   Specimen: Nasal Mucosa; Nasal Swab  Result Value Ref Range Status   MRSA, PCR NEGATIVE NEGATIVE Final   Staphylococcus aureus POSITIVE (A) NEGATIVE Final    Comment: (NOTE) The Xpert SA Assay (FDA approved for NASAL specimens in patients 51 years of age and older), is one component of a comprehensive surveillance program. It is not intended to diagnose infection nor to guide or monitor treatment. Performed at [facility], [address] [place], [place] [address]      Labs: BNP (last 3 results) No results for input(s): "BNP" in the last 8760 hours. Basic Metabolic Panel: Recent Labs  Lab 08/31/22 0454 09/01/22 0430 09/01/22 0435 09/02/22 0553 09/03/22 0528 09/04/22 0458  NA 136  --  132* 133* 132* 132*  K 3.6  --  3.0* 3.7 3.3* 3.0*  CL 100  --  95* 98 94* 94*  CO2 29  --  30 29 28 30   GLUCOSE 84  --  96 87 98 123*  BUN 12  --  8 9 6* 10  CREATININE 0.76  --  0.75 0.70 0.82 0.87  CALCIUM 8.3*  --  7.9* 8.0* 8.2* 8.5*  MG  --  2.2  --   --  2.0  --    Liver Function Tests: Recent Labs  Lab 08/30/22 0445 09/03/22 0528 09/04/22 0458  AST 31 22 19   ALT 43 24 18  ALKPHOS 59 72 80  BILITOT 0.8 0.7 0.4  PROT 6.1* 5.1*  5.7*  ALBUMIN 3.0* 2.2* 2.4*   No results for input(s): "LIPASE", "AMYLASE" in the last 168 hours. No results for input(s): "AMMONIA" in the last 168 hours. CBC: Recent Labs  Lab 08/31/22 0454 09/01/22 0435  09/02/22 0553 09/03/22 0528 09/04/22 0458  WBC 12.0* 9.9 9.0 14.8* 9.3  HGB 9.1* 9.3* 9.4* 9.2* 9.6*  HCT 28.9* 29.7* 29.9* 29.6* 30.6*  MCV 91.2 92.0 90.9 90.5 90.0  PLT 156 147* 153 168 185   Cardiac Enzymes: No results for input(s): "CKTOTAL", "CKMB", "CKMBINDEX", "TROPONINI" in the last 168 hours. BNP: Invalid input(s): "POCBNP" CBG: Recent Labs  Lab 09/03/22 1317 09/03/22 1734 09/03/22 2231 09/04/22 0713 09/04/22 1143  GLUCAP 89 116* 113* 112* 95   D-Dimer No results for input(s): "DDIMER" in the last 72 hours. Hgb A1c No results for input(s): "HGBA1C" in the last 72 hours. Lipid Profile No results for input(s): "CHOL", "HDL", "LDLCALC", "TRIG", "CHOLHDL", "LDLDIRECT" in the last 72 hours. Thyroid function studies No results for input(s): "TSH", "T4TOTAL", "T3FREE", "THYROIDAB" in the last 72 hours.  Invalid input(s): "FREET3" Anemia work up No results for input(s): "VITAMINB12", "FOLATE", "FERRITIN", "TIBC", "IRON", "RETICCTPCT" in the last 72 hours. Urinalysis No results found for: "COLORURINE", "APPEARANCEUR", "LABSPEC", "PHURINE", "GLUCOSEU", "HGBUR", "BILIRUBINUR", "KETONESUR", "PROTEINUR", "UROBILINOGEN", "NITRITE", "LEUKOCYTESUR" Sepsis Labs Recent Labs  Lab 09/01/22 0435 09/02/22 0553 09/03/22 0528 09/04/22 0458  WBC 9.9 9.0 14.8* 9.3   Microbiology Recent Results (from the past 240 hour(s))  Calprotectin, Fecal     Status: Abnormal   Collection Time: 08/27/22  8:29 AM   Specimen: Stool  Result Value Ref Range Status   Calprotectin, Fecal 459 (H) 0 - 120 ug/g Final    Comment: (NOTE) Concentration     Interpretation   Follow-Up < 5 - 50 ug/g     Normal           None >50 -120 ug/g     Borderline       Re-evaluate in 4-6 weeks    >120 ug/g     Abnormal         Repeat as clinically                                   indicated Performed At: [facility] [address] [place], [place] [id number] Jolene Schimke MD NW:[id number]    Surgical PCR screen     Status: Abnormal   Collection Time: 08/29/22  2:22 AM   Specimen: Nasal Mucosa; Nasal Swab  Result Value Ref Range Status   MRSA, PCR NEGATIVE NEGATIVE Final   Staphylococcus aureus POSITIVE (A) NEGATIVE Final    Comment: (NOTE) The Xpert SA Assay (FDA approved for NASAL specimens in patients 70 years of age and older), is one component of a comprehensive surveillance program. It is not intended to diagnose infection nor to guide or monitor treatment. Performed at [facility], [address], [place], [place] [address]      Time coordinating discharge: 37 minutes  SIGNED  Alwyn Ren, MD  [facility] 09/04/2022, 5:56 PM

## 2022-09-04 NOTE — Telephone Encounter (Signed)
Pt was scheduled for an office visit with Dr. Leone Payor on 10/31/2022 at 8:50 AM. Chart reviewed. Pt still in hospital.  Will contact pt once discharged

## 2022-09-05 ENCOUNTER — Encounter (HOSPITAL_COMMUNITY): Payer: Self-pay | Admitting: *Deleted

## 2022-09-05 ENCOUNTER — Other Ambulatory Visit: Payer: Self-pay

## 2022-09-05 ENCOUNTER — Inpatient Hospital Stay (HOSPITAL_COMMUNITY)
Admission: EM | Admit: 2022-09-05 | Discharge: 2022-09-08 | DRG: 641 | Disposition: A | Discharge status: Home / Self Care | Payer: Medicare Other | Attending: Family Medicine | Admitting: Family Medicine

## 2022-09-05 DIAGNOSIS — Z933 Colostomy status: Secondary | ICD-10-CM | POA: Diagnosis not present

## 2022-09-05 DIAGNOSIS — Z825 Family history of asthma and other chronic lower respiratory diseases: Secondary | ICD-10-CM

## 2022-09-05 DIAGNOSIS — I10 Essential (primary) hypertension: Secondary | ICD-10-CM | POA: Diagnosis present

## 2022-09-05 DIAGNOSIS — Z8616 Personal history of COVID-19: Secondary | ICD-10-CM | POA: Diagnosis not present

## 2022-09-05 DIAGNOSIS — Z888 Allergy status to other drugs, medicaments and biological substances status: Secondary | ICD-10-CM

## 2022-09-05 DIAGNOSIS — E860 Dehydration: Principal | ICD-10-CM | POA: Diagnosis present

## 2022-09-05 DIAGNOSIS — E7800 Pure hypercholesterolemia, unspecified: Secondary | ICD-10-CM | POA: Diagnosis not present

## 2022-09-05 DIAGNOSIS — R112 Nausea with vomiting, unspecified: Secondary | ICD-10-CM | POA: Insufficient documentation

## 2022-09-05 DIAGNOSIS — Z79890 Hormone replacement therapy (postmenopausal): Secondary | ICD-10-CM

## 2022-09-05 DIAGNOSIS — J4520 Mild intermittent asthma, uncomplicated: Secondary | ICD-10-CM | POA: Diagnosis not present

## 2022-09-05 DIAGNOSIS — E876 Hypokalemia: Secondary | ICD-10-CM | POA: Diagnosis present

## 2022-09-05 DIAGNOSIS — Z885 Allergy status to narcotic agent status: Secondary | ICD-10-CM | POA: Diagnosis not present

## 2022-09-05 DIAGNOSIS — K50118 Crohn's disease of large intestine with other complication: Secondary | ICD-10-CM | POA: Diagnosis not present

## 2022-09-05 DIAGNOSIS — K219 Gastro-esophageal reflux disease without esophagitis: Secondary | ICD-10-CM | POA: Diagnosis present

## 2022-09-05 DIAGNOSIS — F329 Major depressive disorder, single episode, unspecified: Secondary | ICD-10-CM | POA: Diagnosis present

## 2022-09-05 DIAGNOSIS — Z6841 Body Mass Index (BMI) 40.0 and over, adult: Secondary | ICD-10-CM | POA: Diagnosis not present

## 2022-09-05 DIAGNOSIS — F419 Anxiety disorder, unspecified: Secondary | ICD-10-CM | POA: Diagnosis present

## 2022-09-05 DIAGNOSIS — Z79899 Other long term (current) drug therapy: Secondary | ICD-10-CM | POA: Diagnosis not present

## 2022-09-05 DIAGNOSIS — E039 Hypothyroidism, unspecified: Secondary | ICD-10-CM | POA: Diagnosis present

## 2022-09-05 DIAGNOSIS — J3089 Other allergic rhinitis: Secondary | ICD-10-CM | POA: Diagnosis present

## 2022-09-05 DIAGNOSIS — D649 Anemia, unspecified: Secondary | ICD-10-CM | POA: Diagnosis present

## 2022-09-05 DIAGNOSIS — N179 Acute kidney failure, unspecified: Secondary | ICD-10-CM | POA: Insufficient documentation

## 2022-09-05 DIAGNOSIS — K5010 Crohn's disease of large intestine without complications: Secondary | ICD-10-CM | POA: Diagnosis present

## 2022-09-05 DIAGNOSIS — Y92009 Unspecified place in unspecified non-institutional (private) residence as the place of occurrence of the external cause: Secondary | ICD-10-CM

## 2022-09-05 DIAGNOSIS — Z9049 Acquired absence of other specified parts of digestive tract: Secondary | ICD-10-CM

## 2022-09-05 DIAGNOSIS — G8929 Other chronic pain: Secondary | ICD-10-CM | POA: Diagnosis present

## 2022-09-05 DIAGNOSIS — Z881 Allergy status to other antibiotic agents status: Secondary | ICD-10-CM | POA: Diagnosis not present

## 2022-09-05 DIAGNOSIS — G4700 Insomnia, unspecified: Secondary | ICD-10-CM | POA: Diagnosis present

## 2022-09-05 DIAGNOSIS — Z86010 Personal history of colonic polyps: Secondary | ICD-10-CM

## 2022-09-05 DIAGNOSIS — M549 Dorsalgia, unspecified: Secondary | ICD-10-CM | POA: Diagnosis present

## 2022-09-05 DIAGNOSIS — J45909 Unspecified asthma, uncomplicated: Secondary | ICD-10-CM | POA: Diagnosis present

## 2022-09-05 DIAGNOSIS — Z905 Acquired absence of kidney: Secondary | ICD-10-CM

## 2022-09-05 DIAGNOSIS — T40605A Adverse effect of unspecified narcotics, initial encounter: Secondary | ICD-10-CM | POA: Diagnosis present

## 2022-09-05 DIAGNOSIS — Z880 Allergy status to penicillin: Secondary | ICD-10-CM

## 2022-09-05 LAB — CBC WITH DIFFERENTIAL/PLATELET
Abs Immature Granulocytes: 0.09 10*3/uL — ABNORMAL HIGH (ref 0.00–0.07)
Basophils Absolute: 0 10*3/uL (ref 0.0–0.1)
Basophils Relative: 0 %
Eosinophils Absolute: 0.2 10*3/uL (ref 0.0–0.5)
Eosinophils Relative: 2 %
HCT: 36.8 % (ref 36.0–46.0)
Hemoglobin: 11.5 g/dL — ABNORMAL LOW (ref 12.0–15.0)
Immature Granulocytes: 1 %
Lymphocytes Relative: 4 %
Lymphs Abs: 0.6 10*3/uL — ABNORMAL LOW (ref 0.7–4.0)
MCH: 27.9 pg (ref 26.0–34.0)
MCHC: 31.3 g/dL (ref 30.0–36.0)
MCV: 89.3 fL (ref 80.0–100.0)
Monocytes Absolute: 1 10*3/uL (ref 0.1–1.0)
Monocytes Relative: 7 %
Neutro Abs: 11.7 10*3/uL — ABNORMAL HIGH (ref 1.7–7.7)
Neutrophils Relative %: 86 %
Platelets: 267 10*3/uL (ref 150–400)
RBC: 4.12 MIL/uL (ref 3.87–5.11)
RDW: 17 % — ABNORMAL HIGH (ref 11.5–15.5)
WBC: 13.6 10*3/uL — ABNORMAL HIGH (ref 4.0–10.5)
nRBC: 0 % (ref 0.0–0.2)

## 2022-09-05 LAB — COMPREHENSIVE METABOLIC PANEL
ALT: 21 U/L (ref 0–44)
AST: 26 U/L (ref 15–41)
Albumin: 2.9 g/dL — ABNORMAL LOW (ref 3.5–5.0)
Alkaline Phosphatase: 96 U/L (ref 38–126)
Anion gap: 13 (ref 5–15)
BUN: 12 mg/dL (ref 8–23)
CO2: 29 mmol/L (ref 22–32)
Calcium: 9 mg/dL (ref 8.9–10.3)
Chloride: 93 mmol/L — ABNORMAL LOW (ref 98–111)
Creatinine, Ser: 1.01 mg/dL — ABNORMAL HIGH (ref 0.44–1.00)
GFR, Estimated: >60 mL/min (ref >60)
Glucose, Bld: 153 mg/dL — ABNORMAL HIGH (ref 70–99)
Potassium: 3.1 mmol/L — ABNORMAL LOW (ref 3.5–5.1)
Sodium: 135 mmol/L (ref 135–145)
Total Bilirubin: 0.5 mg/dL (ref 0.3–1.2)
Total Protein: 6.9 g/dL (ref 6.5–8.1)

## 2022-09-05 LAB — MAGNESIUM: Magnesium: 1.2 mg/dL — ABNORMAL LOW (ref 1.7–2.4)

## 2022-09-05 LAB — BASIC METABOLIC PANEL
Anion gap: 12 (ref 5–15)
BUN: 11 mg/dL (ref 8–23)
CO2: 29 mmol/L (ref 22–32)
Calcium: 8.4 mg/dL — ABNORMAL LOW (ref 8.9–10.3)
Chloride: 95 mmol/L — ABNORMAL LOW (ref 98–111)
Creatinine, Ser: 0.82 mg/dL (ref 0.44–1.00)
GFR, Estimated: >60 mL/min (ref >60)
Glucose, Bld: 109 mg/dL — ABNORMAL HIGH (ref 70–99)
Potassium: 3.5 mmol/L (ref 3.5–5.1)
Sodium: 136 mmol/L (ref 135–145)

## 2022-09-05 LAB — CBC
HCT: 32.7 % — ABNORMAL LOW (ref 36.0–46.0)
Hemoglobin: 10.3 g/dL — ABNORMAL LOW (ref 12.0–15.0)
MCH: 28.5 pg (ref 26.0–34.0)
MCHC: 31.5 g/dL (ref 30.0–36.0)
MCV: 90.3 fL (ref 80.0–100.0)
Platelets: 225 10*3/uL (ref 150–400)
RBC: 3.62 MIL/uL — ABNORMAL LOW (ref 3.87–5.11)
RDW: 17 % — ABNORMAL HIGH (ref 11.5–15.5)
WBC: 12.5 10*3/uL — ABNORMAL HIGH (ref 4.0–10.5)
nRBC: 0 % (ref 0.0–0.2)

## 2022-09-05 LAB — LIPASE, BLOOD: Lipase: 31 U/L (ref 11–51)

## 2022-09-05 LAB — PHOSPHORUS: Phosphorus: 3.2 mg/dL (ref 2.5–4.6)

## 2022-09-05 LAB — LACTIC ACID, PLASMA
Lactic Acid, Venous: 1.8 mmol/L (ref 0.5–1.9)
Lactic Acid, Venous: 0.9 mmol/L (ref 0.5–1.9)

## 2022-09-05 MED ORDER — ONDANSETRON HCL 4 MG PO TABS
4.0000 mg | ORAL_TABLET | Freq: Four times a day (QID) | ORAL | Status: DC | PRN
  Administered 2022-09-07: 4 mg via ORAL
  Filled 2022-09-05: qty 1

## 2022-09-05 MED ORDER — ALBUTEROL SULFATE (2.5 MG/3ML) 0.083% IN NEBU: 2.5000 mg | INHALATION_SOLUTION | Freq: Four times a day (QID) | RESPIRATORY_TRACT | Status: DC | PRN

## 2022-09-05 MED ORDER — LEVOTHYROXINE SODIUM 50 MCG PO TABS
75.0000 ug | ORAL_TABLET | Freq: Every day | ORAL | Status: DC
  Administered 2022-09-05 – 2022-09-08 (×4): 75 ug via ORAL
  Filled 2022-09-05 (×4): qty 1

## 2022-09-05 MED ORDER — ENSURE ENLIVE PO LIQD
237.0000 mL | Freq: Two times a day (BID) | ORAL | Status: DC
  Administered 2022-09-05 – 2022-09-08 (×3): 237 mL via ORAL

## 2022-09-05 MED ORDER — AMLODIPINE BESYLATE 10 MG PO TABS
10.0000 mg | ORAL_TABLET | Freq: Every day | ORAL | Status: DC
  Administered 2022-09-05 – 2022-09-08 (×4): 10 mg via ORAL
  Filled 2022-09-05 (×4): qty 1

## 2022-09-05 MED ORDER — SODIUM CHLORIDE 0.9 % IV SOLN: INTRAVENOUS | Status: DC

## 2022-09-05 MED ORDER — HYOSCYAMINE SULFATE 0.125 MG SL SUBL: 0.1250 mg | SUBLINGUAL_TABLET | SUBLINGUAL | Status: DC | PRN

## 2022-09-05 MED ORDER — ACETAMINOPHEN 325 MG PO TABS: 650.0000 mg | ORAL_TABLET | Freq: Four times a day (QID) | ORAL | Status: DC | PRN

## 2022-09-05 MED ORDER — ALPRAZOLAM 0.5 MG PO TABS: 0.5000 mg | ORAL_TABLET | Freq: Every evening | ORAL | Status: DC | PRN

## 2022-09-05 MED ORDER — FENTANYL CITRATE PF 50 MCG/ML IJ SOSY
25.0000 ug | PREFILLED_SYRINGE | INTRAMUSCULAR | Status: DC | PRN
  Administered 2022-09-05 – 2022-09-06 (×3): 25 ug via INTRAVENOUS
  Filled 2022-09-05 (×4): qty 1

## 2022-09-05 MED ORDER — SODIUM CHLORIDE 0.9 % IV BOLUS
1000.0000 mL | Freq: Once | INTRAVENOUS | Status: AC
  Administered 2022-09-05: 1000 mL via INTRAVENOUS

## 2022-09-05 MED ORDER — HYDROMORPHONE HCL 1 MG/ML IJ SOLN
0.5000 mg | INTRAMUSCULAR | Status: DC | PRN
  Administered 2022-09-05: 0.5 mg via INTRAVENOUS
  Filled 2022-09-05: qty 0.5

## 2022-09-05 MED ORDER — ONDANSETRON HCL 4 MG/2ML IJ SOLN
4.0000 mg | Freq: Four times a day (QID) | INTRAMUSCULAR | Status: DC | PRN
  Administered 2022-09-05 – 2022-09-06 (×3): 4 mg via INTRAVENOUS
  Filled 2022-09-05 (×3): qty 2

## 2022-09-05 MED ORDER — PROCHLORPERAZINE EDISYLATE 10 MG/2ML IJ SOLN
10.0000 mg | Freq: Four times a day (QID) | INTRAMUSCULAR | Status: DC | PRN
  Administered 2022-09-06: 10 mg via INTRAVENOUS
  Filled 2022-09-05: qty 2

## 2022-09-05 MED ORDER — LIOTHYRONINE SODIUM 5 MCG PO TABS
5.0000 ug | ORAL_TABLET | Freq: Every day | ORAL | Status: DC
  Administered 2022-09-05 – 2022-09-08 (×4): 5 ug via ORAL
  Filled 2022-09-05 (×4): qty 1

## 2022-09-05 MED ORDER — ACETAMINOPHEN 650 MG RE SUPP: 650.0000 mg | Freq: Four times a day (QID) | RECTAL | Status: DC | PRN

## 2022-09-05 MED ORDER — POTASSIUM CHLORIDE IN NACL 40-0.9 MEQ/L-% IV SOLN
INTRAVENOUS | Status: DC
  Filled 2022-09-05 (×2): qty 1000

## 2022-09-05 MED ORDER — MORPHINE SULFATE (PF) 4 MG/ML IV SOLN: 4.0000 mg | Freq: Once | INTRAVENOUS | Status: DC

## 2022-09-05 MED ORDER — ATORVASTATIN CALCIUM 20 MG PO TABS
20.0000 mg | ORAL_TABLET | Freq: Every day | ORAL | Status: DC
  Administered 2022-09-05 – 2022-09-08 (×4): 20 mg via ORAL
  Filled 2022-09-05 (×4): qty 1

## 2022-09-05 MED ORDER — MONTELUKAST SODIUM 10 MG PO TABS
10.0000 mg | ORAL_TABLET | Freq: Every day | ORAL | Status: DC
  Administered 2022-09-05 – 2022-09-08 (×4): 10 mg via ORAL
  Filled 2022-09-05 (×4): qty 1

## 2022-09-05 MED ORDER — LORATADINE 10 MG PO TABS
10.0000 mg | ORAL_TABLET | Freq: Every day | ORAL | Status: DC
  Administered 2022-09-05 – 2022-09-08 (×4): 10 mg via ORAL
  Filled 2022-09-05 (×4): qty 1

## 2022-09-05 MED ORDER — VENLAFAXINE HCL ER 150 MG PO CP24
150.0000 mg | ORAL_CAPSULE | Freq: Two times a day (BID) | ORAL | Status: DC
  Administered 2022-09-05 – 2022-09-08 (×7): 150 mg via ORAL
  Filled 2022-09-05 (×7): qty 1

## 2022-09-05 MED ORDER — FENTANYL CITRATE PF 50 MCG/ML IJ SOSY
100.0000 ug | PREFILLED_SYRINGE | Freq: Once | INTRAMUSCULAR | Status: AC
  Administered 2022-09-05: 100 ug via INTRAVENOUS
  Filled 2022-09-05: qty 2

## 2022-09-05 MED ORDER — TRAMADOL HCL 50 MG PO TABS
50.0000 mg | ORAL_TABLET | Freq: Four times a day (QID) | ORAL | Status: DC | PRN
  Administered 2022-09-06: 50 mg via ORAL
  Administered 2022-09-06 – 2022-09-07 (×2): 100 mg via ORAL
  Administered 2022-09-07: 50 mg via ORAL
  Administered 2022-09-08: 100 mg via ORAL
  Filled 2022-09-05 (×2): qty 1
  Filled 2022-09-05 (×2): qty 2
  Filled 2022-09-05: qty 1
  Filled 2022-09-05: qty 2

## 2022-09-05 MED ORDER — POTASSIUM CHLORIDE 10 MEQ/100ML IV SOLN
10.0000 meq | INTRAVENOUS | Status: AC
  Administered 2022-09-05 (×2): 10 meq via INTRAVENOUS
  Filled 2022-09-05 (×2): qty 100

## 2022-09-05 MED ORDER — HYDRALAZINE HCL 20 MG/ML IJ SOLN: 10.0000 mg | Freq: Three times a day (TID) | INTRAMUSCULAR | Status: DC | PRN

## 2022-09-05 MED ORDER — HEPARIN SODIUM (PORCINE) 5000 UNIT/ML IJ SOLN
5000.0000 [IU] | Freq: Three times a day (TID) | INTRAMUSCULAR | Status: DC
  Administered 2022-09-05 – 2022-09-08 (×10): 5000 [IU] via SUBCUTANEOUS
  Filled 2022-09-05 (×10): qty 1

## 2022-09-05 MED ORDER — ONDANSETRON HCL 4 MG/2ML IJ SOLN
4.0000 mg | Freq: Once | INTRAMUSCULAR | Status: AC
  Administered 2022-09-05: 4 mg via INTRAVENOUS
  Filled 2022-09-05: qty 2

## 2022-09-05 NOTE — Assessment & Plan Note (Signed)
-  Stable and well-controlled -Continue as needed bronchodilator management and the use of Singulair.

## 2022-09-05 NOTE — Assessment & Plan Note (Signed)
Continue statin. 

## 2022-09-05 NOTE — Consult Note (Signed)
WOC team consulted for this patient to replace NPWT 09/06/2022 and for ostomy needs.   Patient with exp. Lap L colectomy transverse colostomy 08/29/2022 by Dr. Carolynne Edouard.  See consult notes from previous admission from [facility] team. Discharged home 09/04/2022.   Patient has home NPWT machine and supplies in room.  This RN ordered hospital NPWT machine, cannister [employer] [place] [phone] 2 large NPWT dressings [employer] [place] [phone]). Patient has ostomy supplies in room as well, did order 4 sets of 1 piece flexible convex [employer] [place] [phone]) and 2" barrier ring [employer] [place] [phone]).   WOC team will place NPWT as ordered for 09/06/2022.    Thank you,    Priscella Mann MSN, RN-BC, [employer] [phone]

## 2022-09-05 NOTE — Assessment & Plan Note (Signed)
-  Body mass index is 42.67 kg/m. -Low-calorie diet, portion control and increase physical activity discussed with patient.

## 2022-09-05 NOTE — Assessment & Plan Note (Signed)
-  Continue the use of Synthroid and Cytomel -Stable TSH was checked during recent admission. -Continue outpatient follow-up of patient's thyroid panel.

## 2022-09-05 NOTE — Assessment & Plan Note (Signed)
-  In the setting of GI losses -Will replete electrolytes and follow trend. -Telemetry monitoring while stabilizing electrolytes.

## 2022-09-05 NOTE — ED Notes (Signed)
ED TO INPATIENT HANDOFF REPORT  ED Nurse Name and Phone #:   S Name/Age/Gender Whitney Higgins 67 y.o. female Room/Bed: WA19/WA19  Code Status   Code Status: Full Code  Home/SNF/Other Home Patient oriented to: self, place, time, and situation Is this baseline? Yes   Triage Complete: Triage complete  Chief Complaint AKI (acute kidney injury) [N17.9]  Triage Note Pt discharged yesterday afternoon after a two week admission. Had abdominal surgery with wound vac in place. Enroute home from the hospital, pt started having NV, has been unable to tolerate PO liquids at home. ~143ml liquid stool emptied from colostomy bag, another diarrhea emptied shortly after. Stool noted to be leaking around the ostomy bag   Allergies Allergies  Allergen Reactions   Codeine Nausea And Vomiting   Clarithromycin Rash   Dicyclomine Hcl     Dizzy, and headache   Penicillins Rash    Level of Care/Admitting Diagnosis ED Disposition     ED Disposition  Admit   Condition  --   Comment  Hospital Area: [facility] [[id number]]  Level of Care: Telemetry [5]  Admit to tele based on following criteria: Monitor QTC interval  Admit to tele based on following criteria: Complex arrhythmia (Bradycardia/Tachycardia)  May admit patient to [facility] or [facility] if equivalent level of care is available:: Yes  Covid Evaluation: Asymptomatic - no recent exposure (last 10 days) testing not required  Diagnosis: AKI (acute kidney injury) [409811]  Admitting Physician: Vassie Loll [[id number]]  Attending Physician: Vassie Loll [[id number]]  Certification:: I certify this patient will need inpatient services for at least 2 midnights  Estimated Length of Stay: 4          B Medical/Surgery History Past Medical History:  Diagnosis Date   Abnormal alkaline phosphatase test 04/07/2019   Allergy    Anxiety    takes Xanax daily   Arthritis    Asthma    inhaler prn and Singulair  daily;OTC Zyrtec daily   Cataract    Chronic back pain    stenosis   COVID-19 05/2019   Crohn's colitis (HCC) - left-sided 01/06/2019   Depression    takes Effexor daily   Diverticulosis    Gallstones    GERD (gastroesophageal reflux disease)    takes Omeprazole daily   H/O: pituitary tumor    History of blood transfusion    no abnormal reaction noted   History of bronchitis    last time less than a yr ago   History of colon polyps    Hx of colonic polyps 01/10/2019   Hyperlipidemia    takes Pravastatin daily   Hypertension    takes Amlodipine and Lisinopril daily   Hypothyroidism    takes synthroid and cytomel daily   IBD (inflammatory bowel disease) UC vs Crohn's 01/06/2019   Insomnia    takes Xanax nightly   Joint pain    Pneumonia    hx of;last time about 79yrs ago   PONV (postoperative nausea and vomiting)    Shortness of breath    with exertion   Weakness    left leg   Past Surgical History:  Procedure Laterality Date   APPENDECTOMY  1974   CESAREAN SECTION  1981   CHOLECYSTECTOMY     COLONOSCOPY     DILATION AND CURETTAGE OF UTERUS  1979   epidural injections     EYE SURGERY     at age 33;vision correction   FLEXIBLE SIGMOIDOSCOPY N/A 08/27/2022  Procedure: FLEXIBLE SIGMOIDOSCOPY;  Surgeon: Lynann Bologna, MD;  Location: [facility];  Service: Gastroenterology;  Laterality: N/A;   JOINT REPLACEMENT  2010   right hip   LAPAROTOMY N/A 08/29/2022   Procedure: EXPLORATORY LAPAROTOMY, EXTENDED LEFT COLECTOMY WITH MOBILIZATION SPLENIC FLEXURE, TRANSVERSE COLOSTOMY;  Surgeon: Griselda Miner, MD;  Location: [facility];  Service: General;  Laterality: N/A;   LUMBAR LAMINECTOMY/DECOMPRESSION MICRODISCECTOMY Left 03/18/2013   Procedure: LEFT LUMBAR FOUR-FIVE,LUMBAR FIVE-SACRAL ONE MICRODISCECTOMY;  Surgeon: Reinaldo Meeker, MD;  Location: [facility];  Service: Neurosurgery;  Laterality: Left;  left   partial removal of right kidney  1971   pituitary tumor removed  1983    POLYPECTOMY     SHOULDER SURGERY Right      A IV Location/Drains/Wounds Patient Lines/Drains/Airways Status     Active Line/Drains/Airways     Name Placement date Placement time Site Days   Peripheral IV 09/05/22 20 G Right Antecubital 09/05/22  0200  Antecubital  less than 1   Colostomy LUQ 08/29/22  1418  LUQ  7            Intake/Output Last 24 hours  Intake/Output Summary (Last 24 hours) at 09/05/2022 0446 Last data filed at 09/05/2022 0310 Gross per 24 hour  Intake 1000 ml  Output --  Net 1000 ml    Labs/Imaging Results for orders placed or performed during the hospital encounter of 09/05/22 (from the past 48 hour(s))  CBC with Differential     Status: Abnormal   Collection Time: 09/05/22  2:08 AM  Result Value Ref Range   WBC 13.6 (H) 4.0 - 10.5 K/uL   RBC 4.12 3.87 - 5.11 MIL/uL   Hemoglobin 11.5 (L) 12.0 - 15.0 g/dL   HCT 69.6 29.5 - 28.4 %   MCV 89.3 80.0 - 100.0 fL   MCH 27.9 26.0 - 34.0 pg   MCHC 31.3 30.0 - 36.0 g/dL   RDW 13.2 (H) 44.0 - 10.2 %   Platelets 267 150 - 400 K/uL   nRBC 0.0 0.0 - 0.2 %   Neutrophils Relative % 86 %   Neutro Abs 11.7 (H) 1.7 - 7.7 K/uL   Lymphocytes Relative 4 %   Lymphs Abs 0.6 (L) 0.7 - 4.0 K/uL   Monocytes Relative 7 %   Monocytes Absolute 1.0 0.1 - 1.0 K/uL   Eosinophils Relative 2 %   Eosinophils Absolute 0.2 0.0 - 0.5 K/uL   Basophils Relative 0 %   Basophils Absolute 0.0 0.0 - 0.1 K/uL   Immature Granulocytes 1 %   Abs Immature Granulocytes 0.09 (H) 0.00 - 0.07 K/uL    Comment: Performed at [facility], [address] [place], [place] [address]  Comprehensive metabolic panel     Status: Abnormal   Collection Time: 09/05/22  2:08 AM  Result Value Ref Range   Sodium 135 135 - 145 mmol/L   Potassium 3.1 (L) 3.5 - 5.1 mmol/L   Chloride 93 (L) 98 - 111 mmol/L   CO2 29 22 - 32 mmol/L   Glucose, Bld 153 (H) 70 - 99 mg/dL    Comment: Glucose reference range applies only to samples taken  after fasting for at least 8 hours.   BUN 12 8 - 23 mg/dL   Creatinine, Ser 6.44 (H) 0.44 - 1.00 mg/dL   Calcium 9.0 8.9 - 03.4 mg/dL   Total Protein 6.9 6.5 - 8.1 g/dL   Albumin 2.9 (L) 3.5 - 5.0 g/dL   AST  26 15 - 41 U/L   ALT 21 0 - 44 U/L   Alkaline Phosphatase 96 38 - 126 U/L   Total Bilirubin 0.5 0.3 - 1.2 mg/dL   GFR, Estimated >16 >10 mL/min    Comment: (NOTE) Calculated using the CKD-EPI Creatinine Equation (2021)    Anion gap 13 5 - 15    Comment: Performed at [facility], [address] [place], [place] [address]  Lipase, blood     Status: None   Collection Time: 09/05/22  2:08 AM  Result Value Ref Range   Lipase 31 11 - 51 U/L    Comment: Performed at [facility], [address], [place], [place] [address]  Lactic acid, plasma     Status: None   Collection Time: 09/05/22  2:26 AM  Result Value Ref Range   Lactic Acid, Venous 1.8 0.5 - 1.9 mmol/L    Comment: Performed at [facility], [address] [place], [place] [address]   No results found.  Pending Labs Unresulted Labs (From admission, onward)     Start     Ordered   09/05/22 0500  Basic metabolic panel  Tomorrow morning,   R        09/05/22 0356   09/05/22 0500  CBC  Tomorrow morning,   R        09/05/22 0356   09/05/22 0355  Phosphorus  Once,   R        09/05/22 0356   09/05/22 0355  Magnesium  Once,   R        09/05/22 0356   09/05/22 0353  CBC  (heparin)  Once,   R       Comments: Baseline for heparin therapy IF NOT ALREADY DRAWN.  Notify MD if PLT < 100 K.    09/05/22 0356   09/05/22 0353  Creatinine, serum  (heparin)  Once,   R       Comments: Baseline for heparin therapy IF NOT ALREADY DRAWN.    09/05/22 0356   09/05/22 0132  Lactic acid, plasma  Now then every 2 hours,   R (with STAT occurrences)      09/05/22 0131            Vitals/Pain Today's Vitals   09/05/22 0300 09/05/22 0315 09/05/22 0330 09/05/22 0444  BP: (!)  141/80 (!) 148/73 (!) 158/74   Pulse: 90 88 90   Resp: (!) Temp:    98 F (36.7 C)  TempSrc:    Oral  SpO2: 100% 100% 100%   Weight:      Height:        Isolation Precautions No active isolations  Medications Medications  potassium chloride 10 mEq in 100 mL IVPB (10 mEq Intravenous New Bag/Given 09/05/22 0345)  0.9 %  sodium chloride infusion ( Intravenous New Bag/Given 09/05/22 0346)  heparin injection 5,000 Units (has no administration in time range)  acetaminophen (TYLENOL) tablet 650 mg (has no administration in time range)    Or  acetaminophen (TYLENOL) suppository 650 mg (has no administration in time range)  ondansetron (ZOFRAN) tablet 4 mg (has no administration in time range)    Or  ondansetron (ZOFRAN) injection 4 mg (has no administration in time range)  0.9 % NaCl with KCl 40 mEq / L  infusion (has no administration in time range)  albuterol (PROVENTIL) (2.5 MG/3ML) 0.083% nebulizer solution 2.5 mg (has no administration  in time range)  ALPRAZolam (XANAX) tablet 0.5 mg (has no administration in time range)  atorvastatin (LIPITOR) tablet 20 mg (has no administration in time range)  loratadine (CLARITIN) tablet 10 mg (has no administration in time range)  hyoscyamine (LEVSIN SL) SL tablet 0.125 mg (has no administration in time range)  levothyroxine (SYNTHROID) tablet 75 mcg (has no administration in time range)  liothyronine (CYTOMEL) tablet 5 mcg (has no administration in time range)  montelukast (SINGULAIR) tablet 10 mg (has no administration in time range)  venlafaxine XR (EFFEXOR-XR) 24 hr capsule 150 mg (has no administration in time range)  prochlorperazine (COMPAZINE) injection 10 mg (has no administration in time range)  amLODipine (NORVASC) tablet 10 mg (has no administration in time range)  hydrALAZINE (APRESOLINE) injection 10 mg (has no administration in time range)  sodium chloride 0.9 % bolus 1,000 mL (0 mLs Intravenous Stopped 09/05/22 0310)   ondansetron (ZOFRAN) injection 4 mg (4 mg Intravenous Given 09/05/22 0206)  fentaNYL (SUBLIMAZE) injection 100 mcg (100 mcg Intravenous Given 09/05/22 0206)    Mobility walks     Focused Assessments    R Recommendations: See Admitting Provider Note  Report given to:   Additional Notes:

## 2022-09-05 NOTE — Assessment & Plan Note (Signed)
-  Stable overall -Continue home Effexor and as needed Xanax. -Patient denies suicidal ideation or hallucinations at the moment.

## 2022-09-05 NOTE — Assessment & Plan Note (Signed)
-  Holding ACE inhibitor and diuretics. -Continue the use of amlodipine and as needed hydralazine -Follow-up vital signs and adjust antihypertensive treatment as needed -Continue to maintain adequate hydration.

## 2022-09-05 NOTE — Assessment & Plan Note (Signed)
-  In the setting of decreased oral intake, dehydration most likely prerenal azotemia -Continue minimizing/avoiding nephrotoxic agents and the use of contrast. -Maintain adequate hydration and follow renal function trend. -Denies dysuria/hematuria.

## 2022-09-05 NOTE — H&P (Signed)
History and Physical    Patient: Whitney Higgins ZOX:[id number] DOB: 1955-12-09 DOA: 09/05/2022 DOS: the patient was seen and examined on 09/05/2022 PCP: Montez Hageman, DO  Patient coming from: Home  Chief Complaint:  Chief Complaint  Patient presents with   Post-op Problem   HPI: Whitney Higgins is a 67 y.o. female with medical history significant of anxiety/depression, seasonal allergies, class III obesity, hypertension, hyperlipidemia, hypothyroidism and Crohn's who was recently admitted to the hospital due to Crohn's flare/severely strictured sigmoid diverticulitis and experience surgical resection with colostomy.  Patient completed treatment of IV antibiotics and the use of his steroids while in house and discharged home on 09/03/22; unfortunately she was unable to keep anything down, and experiencing increased output from colostomy.  Patient continues to experiencing intermittent abdominal pain and feeling generally weak/dehydrated.  Patient reported experiencing intermittent nausea/vomiting making it impossible for her to take meds and maintain adequate hydration.  No fever, no chest pain, no palpitations, no focal weaknesses or any other complaints.  Workup in the ED demonstrating findings suggestive of dehydration and acute on chronic renal failure: With electrolyte disturbances.  TRH contacted to place patient in the hospital for further evaluation and management.   Review of Systems: As mentioned in the history of present illness. All other systems reviewed and are negative. Past Medical History:  Diagnosis Date   Abnormal alkaline phosphatase test 04/07/2019   Allergy    Anxiety    takes Xanax daily   Arthritis    Asthma    inhaler prn and Singulair daily;OTC Zyrtec daily   Cataract    Chronic back pain    stenosis   COVID-19 05/2019   Crohn's colitis (HCC) - left-sided 01/06/2019   Depression    takes Effexor daily   Diverticulosis    Gallstones    GERD  (gastroesophageal reflux disease)    takes Omeprazole daily   H/O: pituitary tumor    History of blood transfusion    no abnormal reaction noted   History of bronchitis    last time less than a yr ago   History of colon polyps    Hx of colonic polyps 01/10/2019   Hyperlipidemia    takes Pravastatin daily   Hypertension    takes Amlodipine and Lisinopril daily   Hypothyroidism    takes synthroid and cytomel daily   IBD (inflammatory bowel disease) UC vs Crohn's 01/06/2019   Insomnia    takes Xanax nightly   Joint pain    Pneumonia    hx of;last time about 67yrs ago   PONV (postoperative nausea and vomiting)    Shortness of breath    with exertion   Weakness    left leg   Past Surgical History:  Procedure Laterality Date   APPENDECTOMY  1974   CESAREAN SECTION  1981   CHOLECYSTECTOMY     COLONOSCOPY     DILATION AND CURETTAGE OF UTERUS  1979   epidural injections     EYE SURGERY     at age 32;vision correction   FLEXIBLE SIGMOIDOSCOPY N/A 08/27/2022   Procedure: FLEXIBLE SIGMOIDOSCOPY;  Surgeon: Lynann Bologna, MD;  Location: [facility];  Service: Gastroenterology;  Laterality: N/A;   JOINT REPLACEMENT  2010   right hip   LAPAROTOMY N/A 08/29/2022   Procedure: EXPLORATORY LAPAROTOMY, EXTENDED LEFT COLECTOMY WITH MOBILIZATION SPLENIC FLEXURE, TRANSVERSE COLOSTOMY;  Surgeon: Griselda Miner, MD;  Location: [facility];  Service: General;  Laterality: N/A;   LUMBAR LAMINECTOMY/DECOMPRESSION MICRODISCECTOMY Left 03/18/2013  Procedure: LEFT LUMBAR FOUR-FIVE,LUMBAR FIVE-SACRAL ONE MICRODISCECTOMY;  Surgeon: Reinaldo Meeker, MD;  Location: [facility];  Service: Neurosurgery;  Laterality: Left;  left   partial removal of right kidney  1971   pituitary tumor removed  1983   POLYPECTOMY     SHOULDER SURGERY Right    Social History:  reports that she has never smoked. She has never used smokeless tobacco. She reports current alcohol use. She reports that she does not use  drugs.  Allergies  Allergen Reactions   Codeine Nausea And Vomiting   Clarithromycin Rash   Dicyclomine Hcl     Dizzy, and headache   Penicillins Rash    Family History  Problem Relation Age of Onset   Asthma Mother    Allergic rhinitis Mother    Allergic rhinitis Father    Allergic rhinitis Sister    Allergic rhinitis Brother    Colon cancer Neg Hx    Esophageal cancer Neg Hx    Liver cancer Neg Hx    Pancreatic cancer Neg Hx    Rectal cancer Neg Hx    Stomach cancer Neg Hx    Colon polyps Neg Hx     Prior to Admission medications   Medication Sig Start Date End Date Taking? Authorizing Provider  acetaminophen (TYLENOL) 500 MG tablet Take 1,000 mg by mouth every 6 (six) hours as needed for pain.    [provider]  albuterol (PROVENTIL HFA;VENTOLIN HFA) 108 (90 BASE) MCG/ACT inhaler Inhale 2 puffs into the lungs every 6 (six) hours as needed for wheezing.    [provider]  albuterol (PROVENTIL) (2.5 MG/3ML) 0.083% nebulizer solution Take 2.5 mg by nebulization every 4 (four) hours as needed for wheezing or shortness of breath. 04/18/16   [provider]  ALPRAZolam Prudy Feeler) 0.5 MG tablet Take 0.5 mg by mouth at bedtime as needed for sleep.    [provider]  atorvastatin (LIPITOR) 20 MG tablet Take 20 mg by mouth daily.    [provider]  cetirizine (ZYRTEC) 10 MG tablet Take 10 mg by mouth daily.    [provider]  Cyanocobalamin (B12 LIQUID HEALTH BOOSTER PO) Take 5,000 mcg by mouth daily.    [provider]  fluticasone (FLONASE) 50 MCG/ACT nasal spray Place 1 spray into both nostrils daily as needed for allergies or rhinitis. 02/10/19   [provider]  hyoscyamine (LEVSIN SL) 0.125 MG SL tablet Place 1 tablet (0.125 mg total) under the tongue every 4 (four) hours as needed. Patient taking differently: Place 0.125 mg under the tongue every 4 (four) hours as needed for cramping. 08/15/22   Iva Boop, MD  levothyroxine (SYNTHROID) 75 MCG tablet Take 75 mcg by mouth daily. 11/06/18   [provider]  LIALDA 1.2 g EC tablet TAKE TWO TABLETS BY MOUTH TWICE A DAY Patient taking differently: Take 1.2 g by mouth 2 (two) times daily. 04/27/22   Iva Boop, MD  liothyronine (CYTOMEL) 5 MCG tablet Take 5 mcg by mouth daily. 12/04/18   [provider]  lisinopril-hydrochlorothiazide (PRINZIDE,ZESTORETIC) 20-25 MG per tablet Take 1 tablet by mouth daily.    [provider]  methocarbamol (ROBAXIN) 500 MG tablet Take 1 tablet (500 mg total) by mouth every 6 (six) hours as needed for muscle spasms. 09/04/22   Barnetta Chapel, PA-C  montelukast (SINGULAIR) 10 MG tablet Take 10 mg by mouth daily.    [provider]  naproxen (NAPROSYN) 500 MG tablet  Take 500 mg by mouth daily as needed for moderate pain. 01/25/19   [provider]  ondansetron (ZOFRAN) 4 MG tablet Take 1 tablet (4 mg total) by mouth every 8 (eight) hours as needed for nausea or vomiting. 08/15/22   Iva Boop, MD  ondansetron (ZOFRAN) 4 MG tablet Take 1 tablet (4 mg total) by mouth every 6 (six) hours as needed for nausea. 09/04/22   Barnetta Chapel, PA-C  potassium chloride (KLOR-CON) 10 MEQ tablet Take 10 mEq by mouth daily. 10/29/15   [provider]  traMADol (ULTRAM) 50 MG tablet Take 1-2 tablets (50-100 mg total) by mouth every 6 (six) hours as needed for moderate pain or severe pain (50mg  mdoerate, 100mg  severe). 09/04/22   Barnetta Chapel, PA-C  venlafaxine XR (EFFEXOR-XR) 150 MG 24 hr capsule Take 150 mg by mouth 2 (two) times daily.    [provider]  Vitamin D, Ergocalciferol, (DRISDOL) 1.25 MG (50000 UNIT) CAPS capsule Take 50,000 Units by mouth every 7 (seven) days. 04/17/22   [provider]    Physical Exam: Vitals:   09/05/22 0315 09/05/22 0330 09/05/22 0444 09/05/22 0550  BP: (!) 148/73 (!) 158/74  (!) 157/85  Pulse: 88 90  83  Resp: 15 15  14   Temp:    98 F (36.7 C) 98 F (36.7 C)  TempSrc:   Oral Oral  SpO2: 100% 100%  92%  Weight:    116.3 kg  Height:       General exam: Alert, awake, oriented x 3; feeling weak, tired and complaining of dry mouth. Respiratory system: Good air movement bilaterally, no wheezing or crackles. Cardiovascular system:RRR. No rubs or gallops; unable to assess JVD with body habitus. Gastrointestinal system: Abdomen is obese nondistended, soft and with positive bowel sounds.  Colostomy in left mid section appreciated; mid incisional wound outpatient and currently without any drainage or signs of superimposed infection.   Central nervous system: Alert and oriented. No focal neurological deficits. Extremities: No cyanosis or clubbing. Skin: No petechiae. Psychiatry: Judgement and insight appear normal. Mood & affect appropriate.   Data Reviewed: CBC: White blood cell 13.6, hemoglobin 11.5 and platelet count 267 K Comprehensive metabolic panel: Sodium 135, potassium 3.1, chloride 93, bicarb 29, BUN 12, creatinine 1.01 albumin 2.9, normal LFTs; GFR more than 60. Lipase: 31 Take acid, 1.8  Assessment and Plan: * AKI (acute kidney injury) -In the setting of decreased oral intake, dehydration most likely prerenal azotemia -Continue minimizing/avoiding nephrotoxic agents and the use of contrast. -Maintain adequate hydration and follow renal function trend. -Denies dysuria/hematuria.  Hypokalemia -In the setting of GI losses -Will replete electrolytes and follow trend. -Telemetry monitoring while stabilizing electrolytes.  Major depressive disorder -Stable overall -Continue home Effexor and as needed Xanax. -Patient denies suicidal ideation or hallucinations at the moment.  Hypercholesterolemia -Continue statin.  Essential hypertension -Holding ACE inhibitor and diuretics. -Continue the use of amlodipine and as needed hydralazine -Follow-up vital signs and adjust antihypertensive treatment as  needed -Continue to maintain adequate hydration.  Asthma -Stable and well-controlled -Continue as needed bronchodilator management and the use of Singulair.  Acquired hypothyroidism -Continue the use of Synthroid and Cytomel -Stable TSH was checked during recent admission. -Continue outpatient follow-up of patient's thyroid panel.  Obesity, Class III, BMI 40-49.9 (morbid obesity) -Body mass index is 42.67 kg/m. -Low-calorie diet, portion control and increase physical activity discussed with patient.  Crohn's colitis (HCC) - left-sided -Status post steroids and surgical intervention with resection and colostomy  and recent admission -Patient will be follow by general surgery on inpatient -For now continue slowly advancing diet, controlling intractable nausea/vomiting -Based on patient's response GI service may need to be involved to determine further care.  Perennial allergic rhinitis with a predominant nonallergic component -Continue the use of Claritin.   Advance Care Planning:   Code Status: Full Code   Consults: General surgery was consulted by EDP.  Family Communication: Husband at bedside.  Severity of Illness: The appropriate patient status for this patient is INPATIENT. Inpatient status is judged to be reasonable and necessary in order to provide the required intensity of service to ensure the patient's safety. The patient's presenting symptoms, physical exam findings, and initial radiographic and laboratory data in the context of their chronic comorbidities is felt to place them at high risk for further clinical deterioration. Furthermore, it is not anticipated that the patient will be medically stable for discharge from the hospital within 2 midnights of admission.   * I certify that at the point of admission it is my clinical judgment that the patient will require inpatient hospital care spanning beyond 2 midnights from the point of admission due to high intensity of  service, high risk for further deterioration and high frequency of surveillance required.*  Author: Vassie Loll, MD 09/05/2022 7:21 AM  For on call review [email or web address].

## 2022-09-05 NOTE — Assessment & Plan Note (Signed)
-  Status post steroids and surgical intervention with resection and colostomy and recent admission -Patient will be follow by general surgery on inpatient -For now continue slowly advancing diet, controlling intractable nausea/vomiting -Based on patient's response GI service may need to be involved to determine further care.

## 2022-09-05 NOTE — Progress Notes (Signed)
67 year old female admitted by my partner this morning.  She was discharged 09/04/2022 after left colectomy with transverse colostomy on 08/29/2022.  She had a 18-day stay during her last hospital stay.  She is readmitted with intractable nausea vomiting increased output from colostomy with dehydration.  hold hydrochlorothiazide . Labs reviewed patient seen and discussed with surgery.

## 2022-09-05 NOTE — TOC Progression Note (Signed)
Transition of Care [facility]) - Progression Note    Patient Details  Name: Whitney Higgins MRN: [id number] Date of Birth: 1956/01/17  Transition of Care [facility]) CM/SW Contact  Beckie Busing, RN Phone Number:[phone]  09/05/2022, 1:21 PM  Clinical Narrative:     Transition of Care [facility]) Screening Note   Patient Details  Name: Whitney Higgins Date of Birth: 25-Nov-1955   Transition of Care [facility]) CM/SW Contact:    Beckie Busing, RN Phone Number: 09/05/2022, 1:22 PM    Transition of Care Department [facility]) has reviewed patient and no TOC needs have been identified at this time. We will continue to monitor patient advancement through interdisciplinary progression rounds. If new patient transition needs arise, please place a TOC consult.          Expected Discharge Plan and Services                                               Social Determinants of Health (SDOH) Interventions SDOH Screenings   Food Insecurity: No Food Insecurity (09/05/2022)  Housing: Low Risk  (09/05/2022)  Transportation Needs: No Transportation Needs (09/05/2022)  Utilities: Not At Risk (09/05/2022)  Tobacco Use: Low Risk  (09/05/2022)    Readmission Risk Interventions    08/18/2022    9:28 AM  Readmission Risk Prevention Plan  Post Dischage Appt Complete  Medication Screening Complete  Transportation Screening Complete

## 2022-09-05 NOTE — ED Triage Notes (Signed)
Pt discharged yesterday afternoon after a two week admission. Had abdominal surgery with wound vac in place. Enroute home from the hospital, pt started having NV, has been unable to tolerate PO liquids at home. ~152ml liquid stool emptied from colostomy bag, another diarrhea emptied shortly after. Stool noted to be leaking around the ostomy bag

## 2022-09-05 NOTE — ED Provider Notes (Addendum)
[place] EMERGENCY DEPARTMENT AT [facility] Provider Note   CSN: [id number] Arrival date & time: 09/05/22  0050     History  Chief Complaint  Patient presents with   Post-op Problem    Whitney Higgins is a 67 y.o. female.  HPI     This is a 67 year old female who presents with increased ostomy output, nausea, vomiting.  Patient was discharged from the hospital just yesterday.  At that time she was tolerating a small amount of Ensure.  After being discharged she developed nausea and vomiting.  She has not been able to take anything in.  She has also had high ostomy output of liquid stool.  She is concerned because she has a wound VAC and there is stool in the wound VAC as well.  Denies any fevers.  Reports baseline abdominal pain.  Chart reviewed.  She had a laparotomy approximately 1 week ago by Dr. Carolynne Edouard.  At that time she had colectomy and transverse colostomy.  Her midline incision was left open with a wound VAC in place.  Home Medications Prior to Admission medications   Medication Sig Start Date End Date Taking? Authorizing Provider  acetaminophen (TYLENOL) 500 MG tablet Take 1,000 mg by mouth every 6 (six) hours as needed for pain.    [provider]  albuterol (PROVENTIL HFA;VENTOLIN HFA) 108 (90 BASE) MCG/ACT inhaler Inhale 2 puffs into the lungs every 6 (six) hours as needed for wheezing.    [provider]  albuterol (PROVENTIL) (2.5 MG/3ML) 0.083% nebulizer solution Take 2.5 mg by nebulization every 4 (four) hours as needed for wheezing or shortness of breath. 04/18/16   [provider]  ALPRAZolam Prudy Feeler) 0.5 MG tablet Take 0.5 mg by mouth at bedtime as needed for sleep.    [provider]  atorvastatin (LIPITOR) 20 MG tablet Take 20 mg by mouth daily.    [provider]  cetirizine (ZYRTEC) 10 MG tablet Take 10 mg by mouth daily.    [provider]  Cyanocobalamin (B12 LIQUID HEALTH BOOSTER PO) Take 5,000  mcg by mouth daily.    [provider]  fluticasone (FLONASE) 50 MCG/ACT nasal spray Place 1 spray into both nostrils daily as needed for allergies or rhinitis. 02/10/19   [provider]  hyoscyamine (LEVSIN SL) 0.125 MG SL tablet Place 1 tablet (0.125 mg total) under the tongue every 4 (four) hours as needed. Patient taking differently: Place 0.125 mg under the tongue every 4 (four) hours as needed for cramping. 08/15/22   Iva Boop, MD  levothyroxine (SYNTHROID) 75 MCG tablet Take 75 mcg by mouth daily. 11/06/18   [provider]  LIALDA 1.2 g EC tablet TAKE TWO TABLETS BY MOUTH TWICE A DAY Patient taking differently: Take 1.2 g by mouth 2 (two) times daily. 04/27/22   Iva Boop, MD  liothyronine (CYTOMEL) 5 MCG tablet Take 5 mcg by mouth daily. 12/04/18   [provider]  lisinopril-hydrochlorothiazide (PRINZIDE,ZESTORETIC) 20-25 MG per tablet Take 1 tablet by mouth daily.    [provider]  methocarbamol (ROBAXIN) 500 MG tablet Take 1 tablet (500 mg total) by mouth every 6 (six) hours as needed for muscle spasms. 09/04/22   Barnetta Chapel, PA-C  montelukast (SINGULAIR) 10 MG tablet Take 10 mg by mouth daily.    [provider]  naproxen (NAPROSYN) 500 MG tablet Take 500 mg by mouth daily as needed for moderate pain. 01/25/19   [provider]  ondansetron (  ZOFRAN) 4 MG tablet Take 1 tablet (4 mg total) by mouth every 8 (eight) hours as needed for nausea or vomiting. 08/15/22   Iva Boop, MD  ondansetron (ZOFRAN) 4 MG tablet Take 1 tablet (4 mg total) by mouth every 6 (six) hours as needed for nausea. 09/04/22   Barnetta Chapel, PA-C  potassium chloride (KLOR-CON) 10 MEQ tablet Take 10 mEq by mouth daily. 10/29/15   [provider]  traMADol (ULTRAM) 50 MG tablet Take 1-2 tablets (50-100 mg total) by mouth every 6 (six) hours as needed for moderate pain or severe pain (  mdoerate,  severe). 09/04/22   Barnetta Chapel, PA-C  venlafaxine XR (EFFEXOR-XR) 150 MG 24 hr capsule Take 150 mg by mouth 2 (two) times daily.    [provider]  Vitamin D, Ergocalciferol, (DRISDOL) 1.25 MG (50000 UNIT) CAPS capsule Take 50,000 Units by mouth every 7 (seven) days. 04/17/22   [provider]      Allergies    Codeine, Clarithromycin, Dicyclomine hcl, and Penicillins    Review of Systems   Review of Systems  Constitutional:  Negative for fever.  Respiratory:  Negative for shortness of breath.   Cardiovascular:  Negative for chest pain.  Gastrointestinal:  Positive for abdominal pain, diarrhea, nausea and vomiting.  All other systems reviewed and are negative.   Physical Exam Updated Vital Signs BP (!) 148/73   Pulse 88   Temp 97.6 F (36.4 C)   Resp 15   Ht 1.651 m ( )   Wt 117.9 kg   SpO2 100%   BMI 43.27 kg/m  Physical Exam Vitals and nursing note reviewed.  Constitutional:      Appearance: She is well-developed. She is obese.     Comments: Ill-appearing but nontoxic  HENT:     Head: Normocephalic and atraumatic.  Eyes:     Pupils: Pupils are equal, round, and reactive to light.  Cardiovascular:     Rate and Rhythm: Regular rhythm. Tachycardia present.     Heart sounds: Normal heart sounds.  Pulmonary:     Effort: Pulmonary effort is normal. No respiratory distress.  Abdominal:     General: Bowel sounds are normal.     Palpations: Abdomen is soft.     Tenderness: There is abdominal tenderness.     Comments: Generalized tenderness to palpation, wound VAC in place over midline incision, no adjacent erythema, there does appear to be stool leaking under the wound VAC from the ostomy.  Ostomy with very liquidy stool.  Per nursing approximately 150 cc emptied from bag and 400 cc emptied shortly thereafter.  Musculoskeletal:     Cervical back: Neck supple.  Skin:    General: Skin is warm and dry.  Neurological:     Mental Status: She is alert and oriented to person,  place, and time.  Psychiatric:        Mood and Affect: Mood normal.     ED Results / Procedures / Treatments   Labs (all labs ordered are listed, but only abnormal results are displayed) Labs Reviewed  CBC WITH DIFFERENTIAL/PLATELET - Abnormal; Notable for the following components:      Result Value   WBC 13.6 (*)    Hemoglobin 11.5 (*)    RDW 17.0 (*)    Neutro Abs 11.7 (*)    Lymphs Abs 0.6 (*)    Abs Immature Granulocytes 0.09 (*)    All other components within normal limits  COMPREHENSIVE METABOLIC PANEL -  Abnormal; Notable for the following components:   Potassium 3.1 (*)    Chloride 93 (*)    Glucose, Bld 153 (*)    Creatinine, Ser 1.01 (*)    Albumin 2.9 (*)    All other components within normal limits  LIPASE, BLOOD  LACTIC ACID, PLASMA  LACTIC ACID, PLASMA    EKG None  Radiology No results found.  Procedures .Critical Care  Performed by: Shon Baton, MD Authorized by: Shon Baton, MD   Critical care provider statement:    Critical care time (minutes):  40   Critical care was necessary to treat or prevent imminent or life-threatening deterioration of the following conditions:  Dehydration   Critical care was time spent personally by me on the following activities:  Development of treatment plan with patient or surrogate, discussions with consultants, evaluation of patient's response to treatment, examination of patient, ordering and review of laboratory studies, ordering and review of radiographic studies, ordering and performing treatments and interventions, pulse oximetry, re-evaluation of patient's condition and review of old charts     Medications Ordered in ED Medications  potassium chloride 10 mEq in 100 mL IVPB (10 mEq Intravenous New Bag/Given 09/05/22 0345)  0.9 %  sodium chloride infusion (has no administration in time range)  sodium chloride 0.9 % bolus 1,000 mL (0 mLs Intravenous Stopped 09/05/22 0310)  ondansetron (ZOFRAN)  injection 4 mg (4 mg Intravenous Given 09/05/22 0206)  fentaNYL (SUBLIMAZE) injection 100 mcg (100 mcg Intravenous Given 09/05/22 0206)    ED Course/ Medical Decision Making/ A&P Clinical Course as of 09/05/22 0347  Tue Sep 05, 2022  0153 Spoke to Dr. Luisa Hart, general surgery.  Recommends replacing wound VAC with a wet-to-dry dressing to avoid any contamination of the wound given high ostomy output. [CH]  0346 On recheck, vital signs improved.  She is no longer tachycardic.  Output from ostomy has slowed down significantly.  Continues to have some abdominal discomfort and nausea. [CH]    Clinical Course User Index [CH] Onetta Spainhower, Mayer Masker, MD                             Medical Decision Making Amount and/or Complexity of Data Reviewed Labs: ordered.  Risk Prescription drug management. Decision regarding hospitalization.   This patient presents to the ED for concern of vomiting, high ostomy output, this involves an extensive number of treatment options, and is a complaint that carries with it a high risk of complications and morbidity.  I considered the following differential and admission for this acute, potentially life threatening condition.  The differential diagnosis includes ongoing postoperative nausea and vomiting, recurrent obstructive process, gastroenteritis with dehydration, metabolic derangement  MDM:    This is a 67 year old female with recent history of colectomy and transverse colostomy who presents with nausea, vomiting.  She is ill-appearing on exam.  Tachycardic.  She has significant watery stool from her ostomy which is leaking into her wound VAC.  Wound VAC was removed and a wet-to-dry dressing was placed to avoid gross contamination.  She had over 500 cc within 20 minutes out of her ostomy.  This did eventually slow down significantly.  She was given a liter of fluids and pain and nausea medication.  Labs notable for slight leukocytosis although may be evidence of  hemoconcentration given increase in hemoglobin as well from baseline.  Potassium 3.1.  This was replaced.  Slight bump in creatinine to 1.01.  (  Labs, imaging, consults)  Labs: I Ordered, and personally interpreted labs.  The pertinent results include: CBC, BMP, lipase, lactate  Imaging Studies ordered: I ordered imaging studies including none I independently visualized and interpreted imaging. I agree with the radiologist interpretation  Additional history obtained from husband at bedside.  External records from outside source obtained and reviewed including recent discharge summary  Cardiac Monitoring: The patient was maintained on a cardiac monitor.  If on the cardiac monitor, I personally viewed and interpreted the cardiac monitored which showed an underlying rhythm of: Sinus rhythm  Reevaluation: After the interventions noted above, I reevaluated the patient and found that they have :improved  Social Determinants of Health:  lives independently  Disposition: Admit  Co morbidities that complicate the patient evaluation  Past Medical History:  Diagnosis Date   Abnormal alkaline phosphatase test 04/07/2019   Allergy    Anxiety    takes Xanax daily   Arthritis    Asthma    inhaler prn and Singulair daily;OTC Zyrtec daily   Cataract    Chronic back pain    stenosis   COVID-19 05/2019   Crohn's colitis (HCC) - left-sided 01/06/2019   Depression    takes Effexor daily   Diverticulosis    Gallstones    GERD (gastroesophageal reflux disease)    takes Omeprazole daily   H/O: pituitary tumor    History of blood transfusion    no abnormal reaction noted   History of bronchitis    last time less than a yr ago   History of colon polyps    Hx of colonic polyps 01/10/2019   Hyperlipidemia    takes Pravastatin daily   Hypertension    takes Amlodipine and Lisinopril daily   Hypothyroidism    takes synthroid and cytomel daily   IBD (inflammatory bowel disease) UC vs  Crohn's 01/06/2019   Insomnia    takes Xanax nightly   Joint pain    Pneumonia    hx of;last time about 46yrs ago   PONV (postoperative nausea and vomiting)    Shortness of breath    with exertion   Weakness    left leg     Medicines Meds ordered this encounter  Medications   sodium chloride 0.9 % bolus 1,000 mL   ondansetron (ZOFRAN) injection 4 mg   DISCONTD: morphine (PF) 4 MG/ML injection 4 mg   fentaNYL (SUBLIMAZE) injection 100 mcg   potassium chloride 10 mEq in 100 mL IVPB   0.9 %  sodium chloride infusion    I have reviewed the patients home medicines and have made adjustments as needed  Problem List / ED Course: Problem List Items Addressed This Visit   None Visit Diagnoses     Dehydration    -  Primary   Nausea and vomiting, unspecified vomiting type                       Final Clinical Impression(s) / ED Diagnoses Final diagnoses:  Dehydration  Nausea and vomiting, unspecified vomiting type    Rx / DC Orders ED Discharge Orders     None         Tinika Bucknam, Mayer Masker, MD 09/05/22 1610    Shon Baton, MD 09/05/22 [phone]

## 2022-09-05 NOTE — Telephone Encounter (Signed)
Chart reviewed. Pt still admitted to hospital.

## 2022-09-05 NOTE — Assessment & Plan Note (Signed)
-  Continue the use of Claritin.

## 2022-09-05 NOTE — Progress Notes (Signed)
   Subjective/Chief Complaint:  She states she took some PO medications just prior to discharge yesterday and had nausea immediately with emesis beginning in the car ride home. She continued to have several episodes of emesis at home. She tried some soup which she could not keep down. She took prescribed antiemetic which did not help. She then developed increasing output from her colostomy to the point of pouch leaking. She presented to ED for this reason. Stool seemed to leak into wound despite vac and so it was removed. Today she has been eating ice chips and not had further emesis. Nausea is improved with antiemetics. Pain has remained controlled.  Work up revealed findings consistent with dehydration and WBC elevated in setting of this. afebrile  Objective: Vital signs in last 24 hours: Temp:  [97.6 F (36.4 C)-98 F (36.7 C)] 98 F (36.7 C) (04/23 0550) Pulse Rate:  [83-129] 83 (04/23 0550) Resp:  [14-22] 14 (04/23 0550) BP: (131-158)/(64-85) 157/85 (04/23 0550) SpO2:  [89 %-100 %] 92 % (04/23 0550) Weight:  [116.3 kg-117.9 kg] 116.3 kg (04/23 0550) Last BM Date :  (colostomy to LLQ)  Intake/Output from previous day: 04/22 0701 - 04/23 0700 In: 1620.8 [I.V.:420.8; IV Piggyback:1200] Out: -  Intake/Output this shift: No intake/output data recorded.  General appearance: alert and cooperative Resp: clear to auscultation bilaterally Cardio: regular rate and rhythm GI: soft, mild tenderness without rebound or guarding. Ostomy pink and productive with soft stool .  Midline wound packed - removed for exam - wound is clean with minimal surrounding skin irritation. No induration or purulent drainage  Lab Results:  Recent Labs    09/05/22 0208 09/05/22 0444  WBC 13.6* 12.5*  HGB 11.5* 10.3*  HCT 36.8 32.7*  PLT 267 225    BMET Recent Labs    09/05/22 0208 09/05/22 0444  NA 135 136  K 3.1* 3.5  CL 93* 95*  CO2 29 29  GLUCOSE 153* 109*  BUN 12 11  CREATININE 1.01*  0.82  CALCIUM 9.0 8.4*    PT/INR No results for input(s): "LABPROT", "INR" in the last 72 hours. ABG No results for input(s): "PHART", "HCO3" in the last 72 hours.  Invalid input(s): "PCO2", "PO2"  Studies/Results: No results found.  Anti-infectives: Anti-infectives (From admission, onward)    None       Assessment/Plan: Exacerbation of colitis with probable stricture  -POD#7 s/p EXPLORATORY LAPAROTOMY, EXTENDED LEFT COLECTOMY WITH MOBILIZATION SPLENIC FLEXURE, TRANSVERSE COLOSTOMY 4/16 Dr. Carolynne Edouard - surgical path: benign stricture - ostomy remains productive despite n/v yesterday. Nausea controlled today. Okay for liquid diet/ensure - continue mobilizing, PT/OT - recommended HH therapies which had been previously arranged - Schedule tylenol and robaxin. PRN tramadol  - WOC following for new ostomy education. Vac MWF - home vac/RN approved. Will ask for vac to be replaced tomorrow. Wet to dry today - WBC slightly elevated in setting of dehydration. Continue to trend and consider CT scan soon if ongoing leukocytosis or lack of symptom improvement  ID - zosyn post op completed VTE - SCDs, heparin subq FEN - SLIV, CLD ADAT FLD, ensure Foley - out 4/17 and voiding   Acute blood loss anemia - Hgb stable HTN Hypothyroidism HLD Anxiety and depression  LOS: 0 days    Eric Form 09/05/2022

## 2022-09-05 NOTE — ED Notes (Signed)
Per EDP Gen surg recommended to change dressing to wet to dry and not apply wound vac yet. RN applied wet to dry dressing. Will continue to monitor.

## 2022-09-06 DIAGNOSIS — R112 Nausea with vomiting, unspecified: Secondary | ICD-10-CM | POA: Insufficient documentation

## 2022-09-06 DIAGNOSIS — E860 Dehydration: Secondary | ICD-10-CM | POA: Diagnosis not present

## 2022-09-06 DIAGNOSIS — E876 Hypokalemia: Secondary | ICD-10-CM | POA: Diagnosis not present

## 2022-09-06 DIAGNOSIS — E039 Hypothyroidism, unspecified: Secondary | ICD-10-CM | POA: Diagnosis not present

## 2022-09-06 LAB — CBC
HCT: 29.8 % — ABNORMAL LOW (ref 36.0–46.0)
Hemoglobin: 9.1 g/dL — ABNORMAL LOW (ref 12.0–15.0)
MCH: 27.7 pg (ref 26.0–34.0)
MCHC: 30.5 g/dL (ref 30.0–36.0)
MCV: 90.9 fL (ref 80.0–100.0)
Platelets: 244 10*3/uL (ref 150–400)
RBC: 3.28 MIL/uL — ABNORMAL LOW (ref 3.87–5.11)
RDW: 17.1 % — ABNORMAL HIGH (ref 11.5–15.5)
WBC: 6.3 10*3/uL (ref 4.0–10.5)
nRBC: 0 % (ref 0.0–0.2)

## 2022-09-06 LAB — COMPREHENSIVE METABOLIC PANEL
ALT: 19 U/L (ref 0–44)
AST: 26 U/L (ref 15–41)
Albumin: 2.2 g/dL — ABNORMAL LOW (ref 3.5–5.0)
Alkaline Phosphatase: 77 U/L (ref 38–126)
Anion gap: 8 (ref 5–15)
BUN: 7 mg/dL — ABNORMAL LOW (ref 8–23)
CO2: 29 mmol/L (ref 22–32)
Calcium: 7.7 mg/dL — ABNORMAL LOW (ref 8.9–10.3)
Chloride: 98 mmol/L (ref 98–111)
Creatinine, Ser: 0.79 mg/dL (ref 0.44–1.00)
GFR, Estimated: >60 mL/min (ref >60)
Glucose, Bld: 124 mg/dL — ABNORMAL HIGH (ref 70–99)
Potassium: 3.8 mmol/L (ref 3.5–5.1)
Sodium: 135 mmol/L (ref 135–145)
Total Bilirubin: 0.4 mg/dL (ref 0.3–1.2)
Total Protein: 5.5 g/dL — ABNORMAL LOW (ref 6.5–8.1)

## 2022-09-06 MED ORDER — METHOCARBAMOL 500 MG PO TABS
500.0000 mg | ORAL_TABLET | Freq: Four times a day (QID) | ORAL | Status: DC | PRN
  Administered 2022-09-07: 500 mg via ORAL
  Filled 2022-09-06: qty 1

## 2022-09-06 NOTE — Progress Notes (Signed)
   Subjective/Chief Complaint:  Feeling better today and asking for solid food. No further nausea or emesis. Abdominal pain controlled. She does feel mildly bloated. She thinks PO medications very much contributed to her n/v day of readdmision  Objective: Vital signs in last 24 hours: Temp:  [98.1 F (36.7 C)-98.9 F (37.2 C)] 98.1 F (36.7 C) (04/24 0548) Pulse Rate:  [87-97] 87 (04/24 0548) Resp:  [14-19] 14 (04/24 0548) BP: (138-179)/(69-93) 154/69 (04/24 0548) SpO2:  [94 %-97 %] 94 % (04/24 0548) Last BM Date :  (LLQ colostomy)  Intake/Output from previous day: No intake/output data recorded. Intake/Output this shift: Total I/O In: 1629.2 [I.V.:1629.2] Out: -   General appearance: alert and cooperative Resp: clear to auscultation bilaterally Cardio: regular rate and rhythm GI: soft, mild tenderness without rebound or guarding. Ostomy pink and productive with soft stool .  Midline wound packed - removed for exam - wound is clean with minimal surrounding skin irritation. No induration or purulent drainage  Lab Results:  Recent Labs    09/05/22 0444 09/06/22 0617  WBC 12.5* 6.3  HGB 10.3* 9.1*  HCT 32.7* 29.8*  PLT 225 244    BMET Recent Labs    09/05/22 0444 09/06/22 0617  NA 136 135  K 3.5 3.8  CL 95* 98  CO2 29 29  GLUCOSE 109* 124*  BUN 11 7*  CREATININE 0.82 0.79  CALCIUM 8.4* 7.7*    PT/INR No results for input(s): "LABPROT", "INR" in the last 72 hours. ABG No results for input(s): "PHART", "HCO3" in the last 72 hours.  Invalid input(s): "PCO2", "PO2"  Studies/Results: No results found.  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 Carolynne Edouard - surgical path: benign stricture - ostomy remains productive despite n/v  yesterday. Nausea controlled today. Okay for liquid diet/ensure - continue mobilizing, PT/OT - recommended HH therapies which had been previously arranged - Schedule tylenol and robaxin. PRN tramadol  - WOC following for new ostomy education. Vac MWF - home vac/RN approved. VAC replacement today. - WBC normalized today - try PO prn antiemetic and pain medications today. Robaxin added  ID - zosyn post op completed VTE - SCDs, heparin subq FEN - soft, ensure Foley - out 4/17 and voiding   Acute blood loss anemia - Hgb stable HTN Hypothyroidism HLD Anxiety and depression  LOS: 1 day    Eric Form, [facility] Surgery 09/06/2022, 10:17 AM Please see Amion for pager number during day hours 7:00am-4:30pm

## 2022-09-06 NOTE — Telephone Encounter (Signed)
Chart reviewed. Pt admitted to hospital

## 2022-09-06 NOTE — Progress Notes (Signed)
  Progress Note   Patient: Whitney Higgins RUE:[id number] DOB: Jan 22, 1956 DOA: 09/05/2022     1 DOS: the patient was seen and examined on 09/06/2022   Brief hospital course: 67 year old woman recently admitted for Crohn's flare, severely strictured sigmoid diverticulitis, status post surgical resection with colostomy who was discharged 4/21 and returned same day with nausea and vomiting and abdominal pain.  Admitted for dehydration.   Assessment and Plan: Dehydration.  Secondary to nausea and vomiting, probably secondary to oral narcotic. Resolved with IV fluids.  Crohn's colitis (HCC) - left-sided Nausea, vomiting Status post recent surgical intervention with resection and colostomy and recent admission Nausea and vomiting resolved.  Tolerating diet. Main issue is pain control.  Stop IV narcotic and see how she tolerates oral medications.  She reports she can tolerate no oral narcotic.  Hypokalemia Secondary to GI losses.  Resolved.   * AKI (acute kidney injury) was considered on admission but has been ruled out.   Perennial allergic rhinitis with a predominant nonallergic component -Continue the use of Claritin.  Major depressive disorder Continue home Effexor and as needed Xanax.  Asthma -Stable and well-controlled -Continue as needed bronchodilator management and the use of Singulair.  Acquired hypothyroidism Continue the use of Synthroid and Cytomel   Obesity, Class III, BMI 40-49.9 (morbid obesity) -Body mass index is 42.67 kg/m. -Low-calorie diet, portion control and increase physical activity discussed with patient.      Subjective:  Tolerating diet Only complaint is abdominal pain "I can't tolerate oral narcotics"  Physical Exam: Vitals:   09/05/22 1727 09/05/22 2142 09/06/22 0548 09/06/22 1441  BP: (!) 179/80 (!) 142/74 (!) 154/69 (!) 143/70  Pulse: 88 97 87 86  Resp: Temp: 98.9 F (37.2 C) 98.2 F (36.8 C) 98.1 F (36.7 C) 98.2 F (36.8 C)   TempSrc:  Oral Oral Oral  SpO2: 96% 97% 94% 96%  Weight:      Height:       Physical Exam Vitals reviewed.  Constitutional:      General: She is not in acute distress.    Appearance: She is not ill-appearing or toxic-appearing.  Cardiovascular:     Rate and Rhythm: Normal rate and regular rhythm.     Heart sounds: No murmur heard. Pulmonary:     Effort: Pulmonary effort is normal. No respiratory distress.     Breath sounds: No wheezing, rhonchi or rales.  Neurological:     Mental Status: She is alert.  Psychiatric:        Mood and Affect: Mood normal.        Behavior: Behavior normal.   Data Reviewed: CMP noted Hgb stable 9.1  Family Communication:   Disposition: Status is: Inpatient Remains inpatient appropriate because: abdominal pain  Planned Discharge Destination: Home    Time spent: 35 minutes  Author: Brendia Sacks, MD 09/06/2022 3:45 PM  For on call review [email or web address].

## 2022-09-06 NOTE — Consult Note (Signed)
WOC Nurse Consult Note: Reason for Consult:Midline abdominal wound for reinitiation of NPWT Wound type:surgical Pressure Injury POA:N/A Measurement:18cm x 5cm x 4cm Wound bed:red, moist Drainage (amount, consistency, odor)  Periwound: Deep creases at 3 and 9 o'clock Dressing procedure/placement/frequency: ND dressing removed.  Skin barrier rings placed at 3 and 9 o'clock in creases, also from 5-7 o'clock around most distal portion of wound. One piece of black foam trimmed and placed into wound, secured with DermaTac drape. Attached to continuous negative pressure and an immediate seal achieved. Patient tolerated procedure well.  2 NPWT dressings are in the room in the cupboard over the sink for future changes.  Next dressing change is scheduled for Friday, 4/26.  WOC Nurse ostomy consult note Stoma type/location: LUQ end colostomy Stomal assessment/size: Not seen today Peristomal assessment: Not seen today Treatment options for stomal/peristomal skin: skin barrier ring Output soft brown stool Ostomy pouching: 1pc.compressible convex with ksin barrier ring wsa replaced this morning and is not changed today during NPWT dressing change. Education provided: None today Enrolled patient in [employer] DC program: Yes, previously  Pouching supplies in room (4 pouches, 4 skin barrier rings.  Pouch change will likely be on Friday with NPWT dressing change.  WOC nursing team will follow, and will remain available to this patient, the nursing and medical teams.    Thank you for inviting [place] to participate in this patient's Plan of Care.  Ladona Mow, MSN, RN, CNS, GNP, Leda Min, [employer], [employer] phone:  [phone]

## 2022-09-06 NOTE — Progress Notes (Signed)
Mobility Specialist - Progress Note   09/06/22 1536  Mobility  Activity Ambulated with assistance in hallway;Ambulated with assistance to bathroom  Level of Assistance Modified independent, requires aide device or extra time  Assistive Device Front wheel walker  Distance Ambulated (ft) 80 ft  Activity Response Tolerated well  Mobility Referral Yes  $Mobility charge 1 Mobility   Pt received in bed and agreeable to mobility. Prior to ambulating pt requested assistance to bathroom. No complaints during session. Pt to bed after session with all needs met.     [facility]

## 2022-09-06 NOTE — Hospital Course (Addendum)
67 year old woman recently admitted for Crohn's flare, severely strictured sigmoid diverticulitis, status post surgical resection with colostomy who was discharged 4/21 and returned same day with nausea and vomiting and abdominal pain.  Admitted for dehydration.

## 2022-09-07 DIAGNOSIS — K50118 Crohn's disease of large intestine with other complication: Secondary | ICD-10-CM | POA: Diagnosis not present

## 2022-09-07 DIAGNOSIS — R112 Nausea with vomiting, unspecified: Secondary | ICD-10-CM | POA: Diagnosis not present

## 2022-09-07 DIAGNOSIS — E860 Dehydration: Secondary | ICD-10-CM | POA: Diagnosis not present

## 2022-09-07 MED ORDER — ONDANSETRON 4 MG PO TBDP: 4.0000 mg | ORAL_TABLET | Freq: Three times a day (TID) | ORAL | 0 refills | Status: AC | PRN

## 2022-09-07 MED ORDER — DOCUSATE SODIUM 100 MG PO CAPS
100.0000 mg | ORAL_CAPSULE | Freq: Two times a day (BID) | ORAL | Status: DC | PRN
  Administered 2022-09-07: 100 mg via ORAL
  Filled 2022-09-07: qty 1

## 2022-09-07 NOTE — Progress Notes (Signed)
   Subjective/Chief Complaint: Tolerated soft diet without n/v. Tried tramadol with good pain control and no nausea. She is worried that she has not had much ostomy output  Husband is bedside  Objective: Vital signs in last 24 hours: Temp:  [98.2 F (36.8 C)-98.3 F (36.8 C)] 98.3 F (36.8 C) (04/25 0930) Pulse Rate:  [78-90] 90 (04/25 0930) Resp:  [14-18] 18 (04/25 0930) BP: (143-153)/(65-75) 149/70 (04/25 0930) SpO2:  [92 %-96 %] 93 % (04/25 0930) Last BM Date :  (colostomy to LLQ; patient reports no output since 09/05/22)  Intake/Output from previous day: 04/24 0701 - 04/25 0700 In: 1869.2 [P.O.:240; I.V.:1629.2] Out: -  Intake/Output this shift: Total I/O In: 120 [P.O.:120] Out: -   General appearance: alert and cooperative GI: soft, very mild tenderness without rebound or guarding. Ostomy pink and productive with scant soft stool.  Midline wound with vac with good suction  Lab Results:  Recent Labs    09/05/22 0444 09/06/22 0617  WBC 12.5* 6.3  HGB 10.3* 9.1*  HCT 32.7* 29.8*  PLT 225 244    BMET Recent Labs    09/05/22 0444 09/06/22 0617  NA 136 135  K 3.5 3.8  CL 95* 98  CO2 29 29  GLUCOSE 109* 124*  BUN 11 7*  CREATININE 0.82 0.79  CALCIUM 8.4* 7.7*    PT/INR No results for input(s): "LABPROT", "INR" in the last 72 hours. ABG No results for input(s): "PHART", "HCO3" in the last 72 hours.  Invalid input(s): "PCO2", "PO2"  Studies/Results: No results found.  Anti-infectives: Anti-infectives (From admission, onward)    None       Assessment/Plan: Exacerbation of colitis with probable stricture  -POD#9 s/p EXPLORATORY LAPAROTOMY, EXTENDED LEFT COLECTOMY WITH MOBILIZATION SPLENIC FLEXURE, TRANSVERSE COLOSTOMY 4/16 Dr. Carolynne Edouard - surgical path: benign stricture - tolerating diet. No n/v.  - continue mobilizing, PT/OT - recommended HH therapies which had been previously arranged - Schedule tylenol and robaxin. PRN tramadol  - WOC  following for new ostomy education. Vac MWF - home vac/RN approved. VAC replacement 4/24  Stable for dc as early as tomorrow following wound vac change  ID - [id number] post op completed VTE - SCDs, heparin subq FEN - soft, ensure Foley - out 4/17 and voiding   Acute blood loss anemia - Hgb stable HTN Hypothyroidism HLD Anxiety and depression   LOS: 2 days    Eric Form, [facility] 09/07/2022, 12:52 PM Please see Amion for pager number during day hours 7:00am-4:30pm

## 2022-09-07 NOTE — Discharge Instructions (Signed)
[facility]      [facility] [phone]  OPEN ABDOMINAL SURGERY: POST OP INSTRUCTIONS  Always review your discharge instruction sheet given to you by the facility where your surgery was performed.  IF YOU HAVE DISABILITY OR FAMILY LEAVE FORMS, YOU MUST BRING THEM TO THE OFFICE FOR PROCESSING.  PLEASE DO NOT GIVE THEM TO YOUR DOCTOR.  A prescription for pain medication may be given to you upon discharge.  Take your pain medication as prescribed, if needed.  If narcotic pain medicine is not needed, then you may take acetaminophen (Tylenol) or ibuprofen (Advil) as needed. Take your usually prescribed medications unless otherwise directed. If you need a refill on your pain medication, please contact your pharmacy. They will contact our office to request authorization.  Prescriptions will not be filled after 5pm or on week-ends. You should follow a light diet the first few days after arrival home, such as soup and crackers, pudding, etc.unless your doctor has advised otherwise. A high-fiber, low fat diet can be resumed as tolerated.   Be sure to include lots of fluids daily. Most patients will experience some swelling and bruising on the chest and neck area.  Ice packs will help.  Swelling and bruising can take several days to resolve Most patients will experience some swelling and bruising in the area of the incision. Ice pack will help. Swelling and bruising can take several days to resolve..  It is common to experience some constipation if taking pain medication after surgery.  Increasing fluid intake and taking a stool softener will usually help or prevent this problem from occurring.  A mild laxative (Milk of Magnesia or Miralax) should be taken according to package directions if there are no bowel movements after 48 hours.  You may have steri-strips (small skin tapes) in place directly over the incision.  These strips should be left on the skin for 7-10 days.  If your surgeon used skin  glue on the incision, you may shower in 24 hours.  The glue will flake off over the next 2-3 weeks.  Any sutures or staples will be removed at the office during your follow-up visit. You may find that a light gauze bandage over your incision may keep your staples from being rubbed or pulled. You may shower and replace the bandage daily. ACTIVITIES:  You may resume regular (light) daily activities beginning the next day--such as daily self-care, walking, climbing stairs--gradually increasing activities as tolerated.  You may have sexual intercourse when it is comfortable.  Refrain from any heavy lifting or straining until approved by your doctor. You may drive when you no longer are taking prescription pain medication, you can comfortably wear a seatbelt, and you can safely maneuver your car and apply brakes Return to Work: ___________________________________ You should see your doctor in the office for a follow-up appointment approximately two weeks after your surgery.  Make sure that you call for this appointment within a day or two after you arrive home to insure a convenient appointment time. OTHER INSTRUCTIONS:  _____________________________________________________________ _____________________________________________________________  WHEN TO CALL YOUR DOCTOR: Fever over 101.0 Inability to urinate Nausea and/or vomiting Extreme swelling or bruising Continued bleeding from incision. Increased pain, redness, or drainage from the incision. Difficulty swallowing or breathing Muscle cramping or spasms. Numbness or tingling in hands or feet or around lips.  The clinic staff is available to answer your questions during regular business hours.  Please don't hesitate to call and ask to speak to one of   the nurses if you have concerns.  For further questions, please visit [email or web address]  

## 2022-09-07 NOTE — Telephone Encounter (Signed)
Chart reviewed. Patient is currently admitted to hospital. Appointment previously scheduled for 10/31/22 at 8:50 am with Dr. Leone Payor. Patient has been sent mychart message, and appointment will also appear on discharge AVS.

## 2022-09-07 NOTE — TOC Progression Note (Signed)
Transition of Care [facility]) - Progression Note    Patient Details  Name: Lillis Nuttle MRN: [id number] Date of Birth: 1956-05-14  Transition [facility] Care [facility]) CM/SW Contact  Beckie Busing, RN Phone [phone]  09/07/2022, 3:26 PM  Clinical Narrative:     Transition of Care [facility]) Screening Note   Patient Details  Name: Leeanna Slaby Date of Birth: [birth year]   Transition of Care [facility]) CM/SW Contact:    Beckie Busing, RN Phone Number: 09/07/2022, 3:26 PM    Transition of Care Department [facility]) has reviewed patient and no TOC needs have been identified at this time. We will continue to monitor patient advancement through interdisciplinary progression rounds. If new patient transition needs arise, please place a TOC consult.          Expected Discharge Plan and Services                                               Social Determinants of Health (SDOH) Interventions SDOH Screenings   Food Insecurity: No Food Insecurity (09/05/2022)  Housing: Low Risk  (09/05/2022)  Transportation Needs: No Transportation Needs (09/05/2022)  Utilities: Not At Risk (09/05/2022)  Tobacco Use: Low Risk  (09/05/2022)    Readmission Risk Interventions    08/18/2022    9:28 AM  Readmission Risk Prevention Plan  Post Dischage Appt Complete  Medication Screening Complete  Transportation Screening Complete

## 2022-09-07 NOTE — Progress Notes (Signed)
  Progress Note   Patient: Whitney Higgins ZOX:[id number] DOB: 1955/08/22 DOA: 09/05/2022     2 DOS: the patient was seen and examined on 09/07/2022   Brief hospital course: 67 year old woman recently admitted for Crohn's flare, severely strictured sigmoid diverticulitis, status post surgical resection with colostomy who was discharged 4/21 and returned same day with nausea and vomiting and abdominal pain.  Admitted for dehydration.   Assessment and Plan: Dehydration.  Secondary to nausea and vomiting, probably secondary to oral narcotic. Dehydration resolved with IV fluids. Nausea and vomiting resolved.  Tolerating tramadol.   Crohn's colitis (HCC) - left-sided Nausea, vomiting Status post recent surgical intervention with resection and colostomy and recent admission Nausea and vomiting resolved.  Tolerating diet. Main issue is pain control.  Tolerating tramadol.   Hypokalemia Secondary to GI losses.  Resolved.   * AKI (acute kidney injury) was considered on admission but has been ruled out.   Perennial allergic rhinitis with a predominant nonallergic component -Continue the use of Claritin.   Major depressive disorder Continue home Effexor and as needed Xanax.   Asthma Stable and well-controlled Continue as needed bronchodilator management and the use of Singulair.   Acquired hypothyroidism Continue the use of Synthroid and Cytomel   Obesity, Class III, BMI 40-49.9 (morbid obesity) -Body mass index is 42.67 kg/m. -Low-calorie diet, portion control and increase physical activity discussed with patient.  Overall improving.  Tolerating tramadol.  If stable, discharge tomorrow.      Subjective:  Feels better, tolerating tramadol  Physical Exam: Vitals:   09/06/22 2115 09/07/22 0542 09/07/22 0930 09/07/22 1318  BP: (!) 152/75 (!) 153/65 (!) 149/70 (!) 142/71  Pulse: 88 78 90 95  Resp: Temp: 98.3 F (36.8 C) 98.2 F (36.8 C) 98.3 F (36.8 C) 97.6 F  (36.4 C)  TempSrc: Oral Oral Oral Oral  SpO2: 92% 92% 93% 94%  Weight:      Height:       Physical Exam Vitals reviewed.  Constitutional:      General: She is not in acute distress.    Appearance: She is not ill-appearing or toxic-appearing.  Cardiovascular:     Rate and Rhythm: Normal rate and regular rhythm.     Heart sounds: No murmur heard. Pulmonary:     Effort: Pulmonary effort is normal. No respiratory distress.     Breath sounds: No wheezing, rhonchi or rales.  Neurological:     Mental Status: She is alert.  Psychiatric:        Mood and Affect: Mood normal.        Behavior: Behavior normal.   Data Reviewed: No new labs  Family Communication: none  Disposition: Status is: Inpatient Remains inpatient appropriate because: n/v, trial of tramadol  Planned Discharge Destination: Home    Time spent: 20 minutes  Author: Brendia Sacks, MD 09/07/2022 4:34 PM  For on call review [email or web address].

## 2022-09-08 DIAGNOSIS — R112 Nausea with vomiting, unspecified: Secondary | ICD-10-CM | POA: Diagnosis not present

## 2022-09-08 DIAGNOSIS — E860 Dehydration: Secondary | ICD-10-CM | POA: Diagnosis not present

## 2022-09-08 MED ORDER — DOCUSATE SODIUM 100 MG PO CAPS
100.0000 mg | ORAL_CAPSULE | Freq: Two times a day (BID) | ORAL | Status: DC
  Administered 2022-09-08: 100 mg via ORAL
  Filled 2022-09-08: qty 1

## 2022-09-08 MED ORDER — POLYETHYLENE GLYCOL 3350 17 G PO PACK: 17.0000 g | PACK | Freq: Every day | ORAL | Status: DC

## 2022-09-08 MED ORDER — POLYETHYLENE GLYCOL 3350 17 G PO PACK
17.0000 g | PACK | Freq: Every day | ORAL | Status: DC
  Administered 2022-09-08: 17 g via ORAL
  Filled 2022-09-08: qty 1

## 2022-09-08 NOTE — Discharge Summary (Signed)
Physician Discharge Summary   Patient: Whitney Higgins MRN: [id number] DOB: 24-Oct-1955  Admit date:     09/05/2022  Discharge date: 09/08/22  Discharge Physician: Brendia Sacks   PCP: Montez Hageman, DO   Recommendations at discharge:  Follow-up with general surgery  Discharge Diagnoses: Principal Problem:   Nausea & vomiting Active Problems:   Perennial allergic rhinitis with a predominant nonallergic component   Crohn's colitis (HCC) - left-sided   Obesity, Class III, BMI 40-49.9 (morbid obesity) (HCC)   Acquired hypothyroidism   Asthma   Essential hypertension   Hypercholesterolemia   Major depressive disorder   Hypokalemia   Dehydration  Resolved Problems:   * No resolved hospital problems. [facility] Course: 67 year old woman recently admitted for Crohn's flare, severely strictured sigmoid diverticulitis, status post surgical resection with colostomy who was discharged 4/21 and returned same day with nausea and vomiting and abdominal pain.  Admitted for dehydration.  Dehydration rapidly corrected.  Pain was controlled with tramadol.  Followed by surgery.  Cleared for discharge 4/26.  Dehydration.  Secondary to nausea and vomiting, probably secondary to oral narcotic. Dehydration resolved with IV fluids. Nausea and vomiting resolved.  Tolerating tramadol.   Crohn's colitis (HCC) - left-sided Nausea, vomiting Status post recent surgical intervention with resection and colostomy and recent admission Nausea and vomiting resolved.  Tolerating diet. Main issue is pain control.  Tolerating tramadol.   Hypokalemia Secondary to GI losses.  Resolved.   * AKI (acute kidney injury) was considered on admission but has been ruled out.   Perennial allergic rhinitis with a predominant nonallergic component -Continue the use of Claritin.   Major depressive disorder Continue home Effexor and as needed Xanax.   Asthma Stable and well-controlled Continue as needed  bronchodilator management and the use of Singulair.   Acquired hypothyroidism Continue the use of Synthroid and Cytomel   Obesity, Class III, BMI 40-49.9 (morbid obesity) Body mass index is 42.67 kg/m.  Normocytic anemia Had ABLA last admission from surgery Hgb stable this admission. No ABLA this admission.       Consultants:  General surgery  Procedures performed:  None   Disposition: Home Diet recommendation:  Discharge Diet Orders (From admission, onward)     Start     Ordered   09/08/22 0000  Diet general        09/08/22 1249           Regular diet DISCHARGE MEDICATION: Allergies as of 09/08/2022       Reactions   Other Nausea And Vomiting, Other (See Comments)   PAIN MEDS AND A LOT OF ANTIBIOTICS CAN ONLY BE TOLERATED VIA IV- NOT BY MOUTH ANESTHESIA- "I GET DEATHLY ILL"   Codeine Nausea And Vomiting   Clarithromycin Rash   Dicyclomine Hcl    Dizzy, and headache   Penicillins Rash        Medication List     STOP taking these medications    Lialda 1.2 g EC tablet Generic drug: mesalamine   ondansetron 4 MG tablet Commonly known as: ZOFRAN       TAKE these medications    acetaminophen 500 MG tablet Commonly known as: TYLENOL Take 1,000 mg by mouth every 6 (six) hours as needed for pain.   albuterol (2.5 MG/3ML) 0.083% nebulizer solution Commonly known as: PROVENTIL Take 2.5 mg by nebulization every 4 (four) hours as needed for wheezing or shortness of breath.   albuterol 108 (90 Base) MCG/ACT inhaler Commonly known as: VENTOLIN HFA  Inhale 2 puffs into the lungs every 6 (six) hours as needed for wheezing.   ALPRAZolam 0.5 MG tablet Commonly known as: XANAX Take 0.5 mg by mouth at bedtime as needed for sleep.   atorvastatin 20 MG tablet Commonly known as: LIPITOR Take 20 mg by mouth daily.   B12 LIQUID HEALTH BOOSTER PO Take 5,000 mcg by mouth daily.   cetirizine 10 MG tablet Commonly known as: ZYRTEC Take 10 mg by mouth  daily.   fluticasone 50 MCG/ACT nasal spray Commonly known as: FLONASE Place 1 spray into both nostrils daily as needed for allergies or rhinitis.   hyoscyamine 0.125 MG SL tablet Commonly known as: LEVSIN SL Place 1 tablet (0.125 mg total) under the tongue every 4 (four) hours as needed. What changed: reasons to take this   levothyroxine 75 MCG tablet Commonly known as: SYNTHROID Take 75 mcg by mouth daily.   liothyronine 5 MCG tablet Commonly known as: CYTOMEL Take 5 mcg by mouth daily.   lisinopril-hydrochlorothiazide 20-25 MG tablet Commonly known as: ZESTORETIC Take 1 tablet by mouth daily.   methocarbamol 500 MG tablet Commonly known as: ROBAXIN Take 1 tablet (500 mg total) by mouth every 6 (six) hours as needed for muscle spasms.   montelukast 10 MG tablet Commonly known as: SINGULAIR Take 10 mg by mouth daily.   naproxen 500 MG tablet Commonly known as: NAPROSYN Take 500 mg by mouth daily as needed for moderate pain.   ondansetron 4 MG disintegrating tablet Commonly known as: ZOFRAN-ODT Take 1 tablet (4 mg total) by mouth every 8 (eight) hours as needed for up to 3 days for nausea or vomiting.   polyethylene glycol 17 g packet Commonly known as: MIRALAX / GLYCOLAX Take 17 g by mouth daily. Start taking on: September 09, 2022   potassium chloride 10 MEQ tablet Commonly known as: KLOR-CON Take 10 mEq by mouth daily.   traMADol 50 MG tablet Commonly known as: ULTRAM Take 1-2 tablets (50-100 mg total) by mouth every 6 (six) hours as needed for moderate pain or severe pain (50mg  mdoerate, 100mg  severe).   venlafaxine XR 150 MG 24 hr capsule Commonly known as: EFFEXOR-XR Take 150 mg by mouth 2 (two) times daily.   Vitamin D (Ergocalciferol) 1.25 MG (50000 UNIT) Caps capsule Commonly known as: DRISDOL Take 50,000 Units by mouth every 7 (seven) days.               Discharge Care Instructions  (From admission, onward)           Start     Ordered    09/08/22 0000  Discharge wound care:       Comments: Continue wound care as per your surgeon   09/08/22 1249            Follow-up Information     Griselda Miner, MD. Go on 09/20/2022.   Specialty: General Surgery Why: 09/20/22, 1:30 pm. Please arrive 30 minutes early to complete check in, and bring photo ID and insurance card. Contact information: [address] [place] [address] [phone]                Feels better, tolerating diet  Discharge Exam: Filed Weights   09/05/22 0101 09/05/22 0550  Weight: 117.9 kg 116.3 kg   Physical Exam Vitals reviewed.  Constitutional:      General: She is not in acute distress.    Appearance: She is not ill-appearing or toxic-appearing.  Cardiovascular:  Rate and Rhythm: Normal rate and regular rhythm.     Heart sounds: No murmur heard. Pulmonary:     Effort: Pulmonary effort is normal. No respiratory distress.     Breath sounds: No wheezing, rhonchi or rales.  Neurological:     Mental Status: She is alert.  Psychiatric:        Mood and Affect: Mood normal.        Behavior: Behavior normal.      Condition at discharge: good  The results of significant diagnostics from this hospitalization (including imaging, microbiology, ancillary and laboratory) are listed below for reference.   Imaging Studies: DG Abd 2 Views  Result Date: 08/28/2022 CLINICAL DATA:  Assess stool burden.  Pain EXAM: ABDOMEN - 2 VIEW COMPARISON:  CT 08/25/2022 and older FINDINGS: There is presumed contrast scattered throughout the nondilated colon and some nondilated loops of small bowel with air as well. Mild colonic stool suggested. No obstruction. No definite free air on these supine radiographs. Surgical clips in the right upper quadrant. Right hip arthroplasty. Curvature and degenerative changes of the spine. IMPRESSION: Nonspecific bowel gas pattern with diffuse bowel contrast. Mild scattered colonic stool Electronically  Signed   By: Karen Kays M.D.   On: 08/28/2022 12:58   CT ABDOMEN PELVIS W CONTRAST  Addendum Date: 08/25/2022   ADDENDUM REPORT: 08/25/2022 12:28 ADDENDUM: Increased amount of stool is noted in the right and transverse colon suggesting that the inflammatory process involving the descending and sigmoid colon is resulting in probable partial obstruction. Electronically Signed   By: Lupita Raider M.D.   On: 08/25/2022 12:28   Result Date: 08/25/2022 CLINICAL DATA:  Left lower quadrant abdominal pain. EXAM: CT ABDOMEN AND PELVIS WITH CONTRAST TECHNIQUE: Multidetector CT imaging of the abdomen and pelvis was performed using the standard protocol following bolus administration of intravenous contrast. RADIATION DOSE REDUCTION: This exam was performed according to the departmental dose-optimization program which includes automated exposure control, adjustment of the mA and/or kV according to patient size and/or use of iterative reconstruction technique. CONTRAST:  OMNIPAQUE IOHEXOL 300 MG/ML  SOLN COMPARISON:  August 20, 2022. FINDINGS: Lower chest: No acute abnormality. Hepatobiliary: No focal liver abnormality is seen. Status post cholecystectomy. No biliary dilatation. Pancreas: Unremarkable. No pancreatic ductal dilatation or surrounding inflammatory changes. Spleen: Normal in size without focal abnormality. Adrenals/Urinary Tract: Adrenal glands are unremarkable. Kidneys are normal, without renal calculi, focal lesion, or hydronephrosis. Bladder is unremarkable. Stomach/Bowel: Status post appendectomy. The stomach is unremarkable. Small bowel dilatation is noted. There is again noted moderate to severe wall thickening involving the proximal sigmoid colon, as well as moderate wall thickening of descending colon. This is concerning for diverticulitis or other infectious or inflammatory colitis. Stable finding is seen in the adjacent peritoneal fat consistent with associated phlegmon or possibly early  abscess. There is also noted a severe narrowing involving the distal sigmoid colon with apple-core appearance best seen on image number 97 of series 5. Malignancy cannot be excluded and sigmoidoscopy is recommended for further evaluation. Vascular/Lymphatic: Aortic atherosclerosis. No enlarged abdominal or pelvic lymph nodes. Reproductive: Uterus and bilateral adnexa are unremarkable. Other: No abdominal wall hernia or abnormality. No abdominopelvic ascites. Musculoskeletal: Status post right total hip arthroplasty. No acute osseous abnormality is noted. IMPRESSION: There is again noted moderate to severe wall thickening involving the proximal sigmoid colon which may represent diverticulitis, although infectious or inflammatory colitis cannot be excluded. Stable findings seen in the adjacent peritoneal fat consistent with associated  phlegmon or possibly early abscess. There is also now noted moderate wall thickening involving the descending colon also concerning for infectious or inflammatory colitis. Small focal severe narrowing is seen involving the distal sigmoid colon with apple-core appearance; this may represent benign stricture, but neoplasm cannot be excluded, and sigmoidoscopy is recommended. Aortic Atherosclerosis (ICD10-I70.0). Electronically Signed: By: Lupita Raider M.D. On: 08/25/2022 12:20   CT ABDOMEN PELVIS W CONTRAST  Result Date: 08/20/2022 CLINICAL DATA:  Right lower quadrant pain. Crohn disease. Diverticulitis. EXAM: CT ABDOMEN AND PELVIS WITH CONTRAST TECHNIQUE: Multidetector CT imaging of the abdomen and pelvis was performed using the standard protocol following bolus administration of intravenous contrast. RADIATION DOSE REDUCTION: This exam was performed according to the departmental dose-optimization program which includes automated exposure control, adjustment of the mA and/or kV according to patient size and/or use of iterative reconstruction technique. CONTRAST:  OMNIPAQUE  IOHEXOL 300 MG/ML  SOLN COMPARISON:  08/16/2022 FINDINGS: Lower Chest: No acute findings. Hepatobiliary:  No hepatic masses identified. Pancreas:  No mass or inflammatory changes. Spleen: Within normal limits in size and appearance. Adrenals/Urinary Tract: No suspicious masses identified. No evidence of ureteral calculi or hydronephrosis. Stomach/Bowel: Moderate wall thickening and pericolonic soft tissue stranding is again seen involving the mid sigmoid colon. A loop of distal ileum is also seen at this site in the central pelvis which shows wall thickening and adjacent soft tissue stranding. Probable fistulae and spiculated mixed attenuation density are seen in the adjacent central pelvis which measures 5.0 x 2.9 cm, suspicious for phlegmon or early abscess. These findings show no significant change. Differential diagnosis includes perforated diverticulitis with diverticular abscess and secondary involvement of small bowel, or penetrating Crohn disease with abscess and secondary involvement of sigmoid colon. No evidence of free intraperitoneal air. Vascular/Lymphatic: No pathologically enlarged lymph nodes. No acute vascular findings. Reproductive: Prior hysterectomy noted. Adnexal regions are unremarkable in appearance. Other:  None. Musculoskeletal: No suspicious bone lesions identified. Right hip prosthesis again noted. IMPRESSION: No significant change in central pelvic inflammatory process involving the sigmoid colon and distal ileum, with adjacent phlegmon or early abscess. Differential diagnosis remains perforated sigmoid diverticulitis with diverticular abscess, and penetrating Crohn disease with abscess and secondary involvement of sigmoid colon. Electronically Signed   By: Danae Orleans M.D.   On: 08/20/2022 16:31   CT Abdomen Pelvis W Contrast  Result Date: 08/16/2022 CLINICAL DATA:  Abdominal pain. History of Crohn's disease. Evaluate for abscess. EXAM: CT ABDOMEN AND PELVIS WITH CONTRAST TECHNIQUE:  Multidetector CT imaging of the abdomen and pelvis was performed using the standard protocol following bolus administration of intravenous contrast. RADIATION DOSE REDUCTION: This exam was performed according to the departmental dose-optimization program which includes automated exposure control, adjustment of the mA and/or kV according to patient size and/or use of iterative reconstruction technique. CONTRAST:  OMNIPAQUE IOHEXOL 300 MG/ML  SOLN COMPARISON:  Abdominopelvic CT 11/25/2019. FINDINGS: Lower chest: Clear lung bases. No significant pleural or pericardial effusion. Hepatobiliary: The liver is normal in density without suspicious focal abnormality. No significant biliary dilatation status post cholecystectomy. Pancreas: Unremarkable. No pancreatic ductal dilatation or surrounding inflammatory changes. Spleen: Normal in size without focal abnormality. Adrenals/Urinary Tract: Both adrenal glands appear normal. No evidence of urinary tract calculus, suspicious renal lesion or hydronephrosis. No definite bladder abnormalities are identified. Bladder assessment limited by incomplete distension and artifact from the patient's right total hip arthroplasty. Stomach/Bowel: Enteric contrast has passed into the mid small bowel, but not traversed the terminal ileum  or ileocecal valve. The stomach appears unremarkable for its degree of distention. The proximal small bowel is normal in caliber, without wall thickening or surrounding inflammation. There is mild fecalization of the distal small bowel without definite small bowel wall thickening or distention. The appendix is not visualized. There is prominent stool throughout the colon. The proximal to mid colon is normal caliber without wall thickening. However, there is moderate wall thickening throughout the sigmoid colon with surrounding inflammatory changes. Probable phlegmon in the sigmoid mesocolon measuring approximately 4.3 x 3.5 cm on image 69/2. No  drainable fluid collection identified. A small fistula in this area cannot be excluded. There is no evidence of fistula to the bladder, vagina or skin. Underlying sigmoid diverticulosis as correlated with previous study. Vascular/Lymphatic: There are no enlarged abdominal or pelvic lymph nodes. Aortic and branch vessel atherosclerosis without evidence of aneurysm or large vessel occlusion. Reproductive: The uterus and ovaries appear unremarkable. No evidence of adnexal mass. Other: Stable small umbilical hernia containing only fat. As above, pelvic inflammatory changes surrounding the sigmoid colon. No ascites or free intraperitoneal air. Musculoskeletal: No acute or significant osseous findings. Multilevel thoracolumbar spondylosis. Previous right total hip arthroplasty. IMPRESSION: 1. Wall thickening of the sigmoid colon with surrounding inflammatory changes and probable phlegmon in the sigmoid mesocolon, consistent with acute inflammation. There is underlying sigmoid diverticulosis, and findings could be secondary to diverticulitis. Given the patient's history, active Crohn's colitis not excluded. No other signs of active bowel inflammation. No drainable fluid collection identified. 2. No evidence of fistula to the bladder, vagina or skin. 3. Prominent stool throughout the colon with mild fecalization of the distal small bowel. No evidence of bowel obstruction. 4.  Aortic Atherosclerosis (ICD10-I70.0). 5. These results will be called to the ordering clinician or representative by the Radiologist Assistant, and communication documented in the PACS or [employer]. Electronically Signed   By: Carey Bullocks M.D.   On: 08/16/2022 16:31    Microbiology: Results for orders placed or performed during the hospital encounter of 08/17/22  Calprotectin, Fecal     Status: Abnormal   Collection Time: 08/18/22  1:03 PM   Specimen: Stool  Result Value Ref Range Status   Calprotectin, Fecal >8,000 (H) 0 - 120 ug/g  Final    Comment: (NOTE) **Results verified by repeat testing** Concentration     Interpretation   Follow-Up < 5 - 50 ug/g     Normal           None >50 -120 ug/g     Borderline       Re-evaluate in 4-6 weeks    >120 ug/g     Abnormal         Repeat as clinically                                   indicated Performed At: [facility] [address] [place], [place] [id number] Jolene Schimke MD HQ:[id number]   Calprotectin, Fecal     Status: Abnormal   Collection Time: 08/27/22  8:29 AM   Specimen: Stool  Result Value Ref Range Status   Calprotectin, Fecal 459 (H) 0 - 120 ug/g Final    Comment: (NOTE) Concentration     Interpretation   Follow-Up < 5 - 50 ug/g     Normal           None >50 -120 ug/g     Borderline  Re-evaluate in 4-6 weeks    >120 ug/g     Abnormal         Repeat as clinically                                   indicated Performed At: [facility] [address] [place], [place] [id number] Jolene Schimke MD QM:[id number]   Surgical PCR screen     Status: Abnormal   Collection Time: 08/29/22  2:22 AM   Specimen: Nasal Mucosa; Nasal Swab  Result Value Ref Range Status   MRSA, PCR NEGATIVE NEGATIVE Final   Staphylococcus aureus POSITIVE (A) NEGATIVE Final    Comment: (NOTE) The Xpert SA Assay (FDA approved for NASAL specimens in patients 18 years of age and older), is one component of a comprehensive surveillance program. It is not intended to diagnose infection nor to guide or monitor treatment. Performed at [facility], [address], [place], [place] [address]     Labs: CBC: Recent Labs  Lab 09/03/22 0528 09/04/22 0458 09/05/22 0208 09/05/22 0444 09/06/22 0617  WBC 14.8* 9.3 13.6* 12.5* 6.3  NEUTROABS  --   --  11.7*  --   --   HGB 9.2* 9.6* 11.5* 10.3* 9.1*  HCT 29.6* 30.6* 36.8 32.7* 29.8*  MCV 90.5 90.0 89.3 90.3 90.9  PLT 168 185 267 225 244   Basic Metabolic Panel: Recent Labs  Lab 09/03/22 0528  09/04/22 0458 09/05/22 0208 09/05/22 0444 09/06/22 0617  NA 132* 132* 135 136 135  K 3.3* 3.0* 3.1* 3.5 3.8  CL 94* 94* 93* 95* 98  CO2 28 30 29 29 29   GLUCOSE 98 123* 153* 109* 124*  BUN 6* 10 12 11  7*  CREATININE 0.82 0.87 1.01* 0.82 0.79  CALCIUM 8.2* 8.5* 9.0 8.4* 7.7*  MG 2.0  --   --  1.2*  --   PHOS  --   --   --  3.2  --    Liver Function Tests: Recent Labs  Lab 09/03/22 0528 09/04/22 0458 09/05/22 0208 09/06/22 0617  AST 22 19 26 26   ALT 24 18 21 19   ALKPHOS 72 80 96 77  BILITOT 0.7 0.4 0.5 0.4  PROT 5.1* 5.7* 6.9 5.5*  ALBUMIN 2.2* 2.4* 2.9* 2.2*   CBG: Recent Labs  Lab 09/03/22 1317 09/03/22 1734 09/03/22 2231 09/04/22 0713 09/04/22 1143  GLUCAP 89 116* 113* 112* 95    Discharge time spent: less than 30 minutes.  Signed: Brendia Sacks, MD [facility] 09/08/2022

## 2022-09-08 NOTE — Care Management Important Message (Signed)
Important Message  Patient Details IM Letter given Name: Whitney Higgins MRN: [id number] Date of Birth: November 10, 1955   Medicare Important Message Given:  Yes     Caren Macadam 09/08/2022, 9:24 AM

## 2022-09-08 NOTE — Progress Notes (Signed)
   Subjective/Chief Complaint: Tolerating diet and no nausea/emesis. Pain well controlled. She has not had output into colostomy for at least one day but has not noted increased bloating/pain. She has not been ambulating  Objective: Vital signs in last 24 hours: Temp:  [97.6 F (36.4 C)-99.1 F (37.3 C)] 97.8 F (36.6 C) (04/26 0515) Pulse Rate:  [84-95] 88 (04/26 0515) Resp:  [18-20] 20 (04/26 0515) BP: (142-165)/(70-72) 165/72 (04/26 0515) SpO2:  [92 %-97 %] 97 % (04/26 0515) Last BM Date :  (colostomy to LLQ; patient reports no output since 09/05/22)  Intake/Output from previous day: 04/25 0701 - 04/26 0700 In: 240 [P.O.:240] Out: -  Intake/Output this shift: No intake/output data recorded.  General appearance: alert and cooperative GI: soft, very mild tenderness without rebound or guarding. Ostomy with scant stool.  Midline wound with vac with good suction  Lab Results:  Recent Labs    09/06/22 0617  WBC 6.3  HGB 9.1*  HCT 29.8*  PLT 244    BMET Recent Labs    09/06/22 0617  NA 135  K 3.8  CL 98  CO2 29  GLUCOSE 124*  BUN 7*  CREATININE 0.79  CALCIUM 7.7*    PT/INR No results for input(s): "LABPROT", "INR" in the last 72 hours. ABG No results for input(s): "PHART", "HCO3" in the last 72 hours.  Invalid input(s): "PCO2", "PO2"  Studies/Results: No results found.  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 Carolynne Edouard - surgical path: benign stricture - tolerating diet. No n/v.  - continue mobilizing, PT/OT - recommended HH therapies which had been previously arranged - Schedule tylenol and robaxin. PRN tramadol  - WOC following for new ostomy education. Vac MWF - home vac/RN approved. VAC replacement 4/24  Added miralax and  schd colace today. Tolerating diet. Encouraged ambulation. Stable for discharge from surgical standpoint  ID - [id number] post op completed VTE - SCDs, heparin subq FEN - soft, ensure Foley - out 4/17 and voiding   Acute blood loss anemia - Hgb stable HTN Hypothyroidism HLD Anxiety and depression   LOS: 3 days    Eric Form, [facility] 09/08/2022, 7:54 AM Please see Amion for pager number during day hours 7:00am-4:30pm

## 2022-09-08 NOTE — Consult Note (Addendum)
WOC Nurse Consult Note: Reason for Consult: NPWT  Wound type: surgical  Pressure Injury POA: n/a Measurement: see Wednesday note  Wound bed: 100% red moist  Drainage (amount, consistency, odor) minimal serosanguinous  Periwound: deep creases at 3 and 9 o'clock; some erythema superior to incision from medical adhesive  Dressing procedure/placement/frequency: NPWT dressing removed, skin barrier cut in half and flattened out to place in crease at 3 o'clock and 9 o'clock, also half skin barrier flattened and placed at distal aspect of wound.  Cleaned wound with NS, filled  wound with 1 piece of black foam Sealed NPWT dressing at HG Patient received PO pain medication per bedside nurse prior to dressing change Patient tolerated procedure well  WOC nurse will continue to provide NPWT dressing changed due to the complexity of the dressing change.    WOC Nurse ostomy follow up Stoma type/location:  LUQ end colostomy  Stomal assessment/size: 1 3/4", oval, red and moist, flush with skin  Peristomal assessment: skin intact  Treatment options for stomal/peristomal skin: 2" barrier ring  Output no stool in pouch  Ostomy pouching: 1pc flexible convex pouch [facility] [place] [phone])  Education provided:  Patient and husband have been educated on prior admission.  At this visit patient states she has been emptying pouch and feels very confident with this.  Had patient put on gloves and remove current pouch using push pull technique.  Reviewed cleaning around stoma with water moistened washcloth only.  Patient able to cut new skin barrier utilizing previous pattern.  Stretched 2" barrier ring to fit around stoma and secured skin barrier.   Enrolled patient in [place] [facility] Start Discharge program: Yes on previous admission; did discuss with patient and husband ostomy clinic at [facility] if patient feels she needs more assistance with ostomy.   Patient has ample ostomy supplies in room.  Did not order  NPWT dressing for room as patient to be discharged later today and has whole box of NPWT dressings in room for home use.  Has home NPWT machine in room and did educate patient that the dressing does not need to be changed, only tubing needs to be clamped from hospital machine and switched over to home NPWT machine.    WOC team will continue to follow while patient is in hospital to change NPWT M-W-F as well as ostomy support.   Thank you,    Priscella Mann MSN, RN-BC, [employer] [phone]

## 2022-10-31 ENCOUNTER — Encounter: Payer: Self-pay | Admitting: Internal Medicine

## 2022-10-31 ENCOUNTER — Other Ambulatory Visit (INDEPENDENT_AMBULATORY_CARE_PROVIDER_SITE_OTHER): Payer: Medicare Other

## 2022-10-31 ENCOUNTER — Ambulatory Visit (INDEPENDENT_AMBULATORY_CARE_PROVIDER_SITE_OTHER): Payer: Medicare Other | Admitting: Internal Medicine

## 2022-10-31 VITALS — BP 114/70 | HR 96 | Ht 64.0 in | Wt 250.5 lb

## 2022-10-31 DIAGNOSIS — K5732 Diverticulitis of large intestine without perforation or abscess without bleeding: Secondary | ICD-10-CM

## 2022-10-31 DIAGNOSIS — K56699 Other intestinal obstruction unspecified as to partial versus complete obstruction: Secondary | ICD-10-CM | POA: Diagnosis not present

## 2022-10-31 DIAGNOSIS — K50119 Crohn's disease of large intestine with unspecified complications: Secondary | ICD-10-CM

## 2022-10-31 DIAGNOSIS — D649 Anemia, unspecified: Secondary | ICD-10-CM

## 2022-10-31 DIAGNOSIS — Z86010 Personal history of colonic polyps: Secondary | ICD-10-CM

## 2022-10-31 LAB — CBC WITH DIFFERENTIAL/PLATELET
Basophils Absolute: 0.1 10*3/uL (ref 0.0–0.1)
Basophils Relative: 1.5 % (ref 0.0–3.0)
Eosinophils Absolute: 0.2 10*3/uL (ref 0.0–0.7)
Eosinophils Relative: 3.2 % (ref 0.0–5.0)
HCT: 42.5 % (ref 36.0–46.0)
Hemoglobin: 13.6 g/dL (ref 12.0–15.0)
Lymphocytes Relative: 14.3 % (ref 12.0–46.0)
Lymphs Abs: 1.1 10*3/uL (ref 0.7–4.0)
MCHC: 32.1 g/dL (ref 30.0–36.0)
MCV: 84.6 fl (ref 78.0–100.0)
Monocytes Absolute: 0.6 10*3/uL (ref 0.1–1.0)
Monocytes Relative: 8.6 % (ref 3.0–12.0)
Neutro Abs: 5.4 10*3/uL (ref 1.4–7.7)
Neutrophils Relative %: 72.4 % (ref 43.0–77.0)
Platelets: 214 10*3/uL (ref 150.0–400.0)
RBC: 5.03 Mil/uL (ref 3.87–5.11)
RDW: 16.2 % — ABNORMAL HIGH (ref 11.5–15.5)
WBC: 7.5 10*3/uL (ref 4.0–10.5)

## 2022-10-31 LAB — FERRITIN: Ferritin: 42.8 ng/mL (ref 10.0–291.0)

## 2022-10-31 LAB — VITAMIN B12: Vitamin B-12: >1500 pg/mL — ABNORMAL HIGH (ref 211–911)

## 2022-10-31 NOTE — Progress Notes (Signed)
Whitney Higgins 67 y.o. 1956-01-06 [id number]  Assessment & Plan:   Encounter Diagnoses  Name Primary?   Crohn's disease of colon with complication (HCC) Yes   Stricture of sigmoid colon (HCC)    Diverticulitis of colon    Normocytic anemia    Hx of colonic polyps    The patient is improved status post resection of left colon stricture.  Cause not entirely clear we thought perhaps diverticulitis I suppose it could have been a complication of her Crohn's disease also.  She will need a colonoscopy prior to any colostomy takedown.  I.  She is going to bring the patient assistance forms and I will review labs and we can then decide about prescribing this.  She will see me again on September 20 at which point we will plan to schedule a colonoscopy I believe.  She has follow-up with Dr. Marin Olp of [facility] surgery regarding her colostomy and potential takedown.  When she was here there was mentioning I thought she should go back on Lialda but she really only had left-sided disease in the past so maybe that is not necessary.  I think we could wait until she has a colonoscopy.  Orders Placed This Encounter  Procedures   CBC with Differential/Platelet   Ferritin   Vitamin B12   Lab Results  Component Value Date   WBC 7.5 10/31/2022   HGB 13.6 10/31/2022   HCT 42.5 10/31/2022   MCV 84.6 10/31/2022   PLT 214.0 10/31/2022   Lab Results  Component Value Date   FERRITIN 42.8 10/31/2022     Lab Results  Component Value Date   VITAMINB12 >1500 (H) 10/31/2022     Subjective:   Chief Complaint: Follow-up after colon stricture resection and Crohn's disease  HPI 67 year old white woman with a history of left-sided Crohn's colitis, segmental disease maintained on Lialda in the past, who was admitted to the hospital in April of this year and ended up having a resection of a left-sided colonic stricture (sigmoid) and a colostomy.  She is much improved though she had a  lengthy hospitalization.  Colostomy functioning well she does have a laparotomy wound that has been slow to heal and is being cared for through home health.  She says it is closing down.  She has had follow-up with Dr. Carolynne Edouard and has follow-up planned with Dr. Cliffton Asters who will perform her colostomy takedown at some point.  I have reviewed the note from Dr. Carolynne Edouard.   No clear diverticulitis mentioned on pathology report. Not on Lialda at this time.  Stools are semiformed no diarrhea no bleeding.  Gets an occasional mucus discharge from her rectal stump.  08/29/2022   2:27 PM   PATIENT:  Whitney Higgins  67 y.o. female   PRE-OPERATIVE DIAGNOSIS:  Exacerbation of colitis   POST-OPERATIVE DIAGNOSIS:  Exacerbation of colitis with probable stricture   PROCEDURE:  Procedure(s): EXPLORATORY LAPAROTOMY, EXTENDED LEFT COLECTOMY WITH MOBILIZATION SPLENIC FLEXURE, TRANSVERSE COLOSTOMY (N/A)   SURGEON:  Surgeon(s) and Role:    * Griselda Miner, MD - Primary    * Andria Meuse, MD - Assisting    SURGICAL PATHOLOGY CASE: [phone] [phone] PATIENT: [facility] Surgical Pathology Report     Clinical History: Exacerbation of colitis (crm)     FINAL MICROSCOPIC DIAGNOSIS:  A. COLON, RECTOSIGMOID, RESECTION: - Segment of colon with benign stricture - Margins are viable - Four benign lymph nodes  B- COLON, EXTENDED LEFT, RESECTION: - Segment of colon  with focal ulceration - Margins are viable - Four benign lymph nodes  C. COLON, DISTAL TRANSVERSE SEGMENT, RESECTION: - Segment of colon with no specific pathologic changes - Margins are viable     History of colon polyps: 3 SSPs, diminutive July 2020 03/2019 - 2 diminutive hyperplastic polyps transverse   Allergies  Allergen Reactions   Other Nausea And Vomiting and Other (See Comments)    PAIN MEDS AND A LOT OF ANTIBIOTICS CAN ONLY BE TOLERATED VIA IV- NOT BY MOUTH  ANESTHESIA- "I GET DEATHLY ILL"   Codeine Nausea And Vomiting    Clarithromycin Rash   Dicyclomine Hcl     Dizzy, and headache   Penicillins Rash   Current Meds  Medication Sig   acetaminophen (TYLENOL) 500 MG tablet Take 1,000 mg by mouth every 6 (six) hours as needed for pain.   albuterol (PROVENTIL HFA;VENTOLIN HFA) 108 (90 BASE) MCG/ACT inhaler Inhale 2 puffs into the lungs every 6 (six) hours as needed for wheezing.   albuterol (PROVENTIL) (2.5 MG/3ML) 0.083% nebulizer solution Take 2.5 mg by nebulization every 4 (four) hours as needed for wheezing or shortness of breath.   ALPRAZolam (XANAX) 0.5 MG tablet Take 0.5 mg by mouth at bedtime as needed for sleep.   amLODipine (NORVASC) 5 MG tablet Take 5 mg by mouth daily.   atorvastatin (LIPITOR) 20 MG tablet Take 20 mg by mouth daily.   cetirizine (ZYRTEC) 10 MG tablet Take 10 mg by mouth daily.   Cyanocobalamin (B12 LIQUID HEALTH BOOSTER PO) Take 5,000 mcg by mouth daily.   fluticasone (FLONASE) 50 MCG/ACT nasal spray Place 1 spray into both nostrils daily as needed for allergies or rhinitis.   levothyroxine (SYNTHROID) 75 MCG tablet Take 75 mcg by mouth daily.   liothyronine (CYTOMEL) 5 MCG tablet Take 5 mcg by mouth daily.   lisinopril-hydrochlorothiazide (PRINZIDE,ZESTORETIC) 20-25 MG per tablet Take 1 tablet by mouth daily.   montelukast (SINGULAIR) 10 MG tablet Take 10 mg by mouth daily.   naproxen (NAPROSYN) 500 MG tablet Take 500 mg by mouth daily as needed for moderate pain.   potassium chloride (KLOR-CON) 10 MEQ tablet Take 10 mEq by mouth daily.   venlafaxine XR (EFFEXOR-XR) 150 MG 24 hr capsule Take 150 mg by mouth 2 (two) times daily.   Vitamin D, Ergocalciferol, (DRISDOL) 1.25 MG (50000 UNIT) CAPS capsule Take 50,000 Units by mouth every 7 (seven) days.   Past Medical History:  Diagnosis Date   Abnormal alkaline phosphatase test 04/07/2019   Allergy    Anxiety    takes Xanax daily   Arthritis    Asthma    inhaler prn and Singulair daily;OTC Zyrtec daily   Cataract    Chronic back  pain    stenosis   COVID-19 05/2019   Crohn's colitis (HCC) - left-sided 01/06/2019   Depression    takes Effexor daily   Diverticulosis    Gallstones    GERD (gastroesophageal reflux disease)    takes Omeprazole daily   H/O: pituitary tumor    History of blood transfusion    no abnormal reaction noted   History of bronchitis    last time less than a yr ago   History of colon polyps    Hx of colonic polyps 01/10/2019   Hyperlipidemia    takes Pravastatin daily   Hypertension    takes Amlodipine and Lisinopril daily   Hypothyroidism    takes synthroid and cytomel daily   IBD (inflammatory bowel disease) UC  vs Crohn's 01/06/2019   Insomnia    takes Xanax nightly   Joint pain    Pneumonia    hx of;last time about 6yrs ago   PONV (postoperative nausea and vomiting)    Shortness of breath    with exertion   Weakness    left leg   Past Surgical History:  Procedure Laterality Date   APPENDECTOMY  1974   CESAREAN SECTION  1981   CHOLECYSTECTOMY     COLONOSCOPY     DILATION AND CURETTAGE OF UTERUS  1979   epidural injections     EYE SURGERY     at age 17;vision correction   FLEXIBLE SIGMOIDOSCOPY N/A 08/27/2022   Procedure: FLEXIBLE SIGMOIDOSCOPY;  Surgeon: Lynann Bologna, MD;  Location: [facility];  Service: Gastroenterology;  Laterality: N/A;   JOINT REPLACEMENT  2010   right hip   LAPAROTOMY N/A 08/29/2022   Procedure: EXPLORATORY LAPAROTOMY, EXTENDED LEFT COLECTOMY WITH MOBILIZATION SPLENIC FLEXURE, TRANSVERSE COLOSTOMY;  Surgeon: Griselda Miner, MD;  Location: [facility]S;  Service: General;  Laterality: N/A;   LUMBAR LAMINECTOMY/DECOMPRESSION MICRODISCECTOMY Left 03/18/2013   Procedure: LEFT LUMBAR FOUR-FIVE,LUMBAR FIVE-SACRAL ONE MICRODISCECTOMY;  Surgeon: Reinaldo Meeker, MD;  Location: [facility];  Service: Neurosurgery;  Laterality: Left;  left   partial removal of right kidney  1971   pituitary tumor removed  1983   POLYPECTOMY     SHOULDER SURGERY Right     Social History   Social History Narrative   Married, on Medicare prior to 62 question disabled   Rare red wine   Never smoker   No drug use   family history includes Allergic rhinitis in her brother, father, mother, and sister; Asthma in her mother.   Review of Systems As per HPI  Objective:   Physical Exam BP 114/70 (BP Location: Left Arm, Patient Position: Sitting, Cuff Size: Large)   Pulse 96   Ht 5\' 4"  (1.626 m)   Wt 250 lb 8 oz (113.6 kg)   BMI 43.00 kg/m  Obese ww NAD Lungs cta Cor NL S1 S2 no rmg Abd - LUQ colostomy - bandaged wound R midline - soft and NT Alert and oriented x 3

## 2022-10-31 NOTE — Patient Instructions (Signed)
Your provider has requested that you go to the basement level for lab work before leaving today. Press "B" on the elevator. The lab is located at the first door on the left as you exit the elevator.  Please bring [place] your Lialda assistance paperwork at your convenience.   I appreciate the opportunity to care for you. Stan Head, MD, [facility]

## 2022-11-23 ENCOUNTER — Telehealth: Payer: Self-pay | Admitting: Internal Medicine

## 2022-11-23 DIAGNOSIS — K56699 Other intestinal obstruction unspecified as to partial versus complete obstruction: Secondary | ICD-10-CM

## 2022-11-23 DIAGNOSIS — K50119 Crohn's disease of large intestine with unspecified complications: Secondary | ICD-10-CM

## 2022-11-23 NOTE — Telephone Encounter (Signed)
OK to schedule colonoscopy.

## 2022-11-23 NOTE — Telephone Encounter (Signed)
Whitney Higgins saw Dr Carolynne Edouard on 11/17/2022-notes in Care Everywhere. She has a Dr Cliffton Asters appointment October 9th. She wants to know if she can go ahead and set up her colonoscopy for late August, or September. Please advise Sir, thank you.

## 2022-11-23 NOTE — Telephone Encounter (Signed)
Madelin has set up a pre-visit for 01/05/2023 and a colon for 01/17/2023 at 9:30AM. Please advise if any special prepping instructions for pre-visit to follow. Thank you.  Mry wants to know if she still needs the 02/02/2023 appointment Sir?

## 2022-11-23 NOTE — Telephone Encounter (Signed)
Inbound call from patient requesting to speak with PJ. Patient is wishing to speak about regarding her being in the hospital in April. Would like to speak about a possible colonoscopy. Please advise, thank you.

## 2022-11-24 ENCOUNTER — Encounter: Payer: Self-pay | Admitting: Internal Medicine

## 2022-11-24 NOTE — Telephone Encounter (Signed)
And the dx you want used Sir?

## 2022-11-24 NOTE — Telephone Encounter (Signed)
Appointment for 02/02/23 cancelled and paperwork mailed to patient to complete for her pre-visit.

## 2022-12-15 NOTE — Telephone Encounter (Signed)
Sir I just want to confirm the Dx you want for her colon, thank you.

## 2022-12-17 NOTE — Telephone Encounter (Signed)
Encounter Diagnoses  Name Primary?   Crohn's disease of colon with complication (HCC) Yes   Stricture of sigmoid colon (HCC)

## 2023-01-05 ENCOUNTER — Ambulatory Visit: Payer: Medicare Other

## 2023-01-12 ENCOUNTER — Ambulatory Visit (AMBULATORY_SURGERY_CENTER): Payer: Medicare Other | Admitting: *Deleted

## 2023-01-12 VITALS — Ht 64.0 in | Wt 246.0 lb

## 2023-01-12 DIAGNOSIS — K50119 Crohn's disease of large intestine with unspecified complications: Secondary | ICD-10-CM

## 2023-01-12 DIAGNOSIS — K51419 Inflammatory polyps of colon with unspecified complications: Secondary | ICD-10-CM

## 2023-01-12 DIAGNOSIS — K5732 Diverticulitis of large intestine without perforation or abscess without bleeding: Secondary | ICD-10-CM

## 2023-01-12 NOTE — Progress Notes (Signed)
Pt's name and DOB verified at the beginning of the pre-visit.  Pt denies any difficulty with ambulating,sitting, laying down or rolling side to side Gave both [facility] main # and MD on call # prior to instructions.  No egg or soy allergy known to patient  No issues known to pt with past sedation with any surgeries or procedures Pt denies having issues being intubated Pt has no issues moving head neck or swallowing No FH of Malignant Hyperthermia Pt is not on diet pills Pt is not on home 02  Pt is not on blood thinners  Pt denies issues with constipation  Pt is not on dialysis Pt denise any abnormal heart rhythms  Pt denies any upcoming cardiac testing Pt encouraged to use to use Singlecare or Goodrx to reduce cost  Patient's chart reviewed by Cathlyn Parsons CNRA prior to pre-visit and patient appropriate for the [facility].  Pre-visit completed and red dot placed by patient's name on their procedure day (on provider's schedule).  . Visit by phone Pt states weight is 246 lb Instructed pt why it is important to and  to call if they have any changes in health or new medications. Directed them to the # given and on instructions.   Pt states they will.  Instructions reviewed with pt and pt states understanding. Instructed to review again prior to procedure. Pt states they will.  Instructions sent by mail with coupon and by my chart Pt within this year was spetic and they due to bowel obstruction, Exp surgery doen and 60% of her colon was removed and she has a colostomy back that she hopes to be reversed.

## 2023-01-13 ENCOUNTER — Encounter: Payer: Self-pay | Admitting: Certified Registered Nurse Anesthetist

## 2023-01-16 NOTE — Progress Notes (Signed)
[facility] History and Physical   Primary Care Physician:  Montez Hageman, DO   Reason for Procedure:   Crohn's, colon stricture, pre-op exam   Plan:    colonoscopy     HPI: Whitney Higgins is a 67 y.o. female w/ hx crohn's colitis who had a leftcolon stricture resected and colostomy earlier this year. Now for pre-op exam prior to colostomy takedwn. Etiology of stricture was not clear to me - Crohn's? Diverticular disease.   Past Medical History:  Diagnosis Date   Abnormal alkaline phosphatase test 04/07/2019   Allergy    Anxiety    takes Xanax daily   Arthritis    Asthma    inhaler prn and Singulair daily;OTC Zyrtec daily   Cataract    Chronic back pain    stenosis   COVID-19 05/2019   Crohn's colitis (HCC) - left-sided 01/06/2019   Depression    takes Effexor daily   Diverticulosis    Gallstones    H/O: pituitary tumor    History of blood transfusion    no abnormal reaction noted   History of bronchitis    last time less than a yr ago   History of colon polyps    Hx of colonic polyps 01/10/2019   Hyperlipidemia    takes Pravastatin daily   Hypertension    takes Amlodipine and Lisinopril daily   Hypothyroidism    takes synthroid and cytomel daily   IBD (inflammatory bowel disease) UC vs Crohn's 01/06/2019   Insomnia    takes Xanax nightly   Joint pain    Osteoporosis    Pneumonia    hx of;last time about 27yrs ago   PONV (postoperative nausea and vomiting)    Shortness of breath    with exertion   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   Weakness    left leg    Past Surgical History:  Procedure Laterality Date   APPENDECTOMY  1974   CESAREAN SECTION  1981   CHOLECYSTECTOMY     COLONOSCOPY     DILATION AND CURETTAGE OF UTERUS  1979   epidural injections     EYE SURGERY     at age 42;vision correction   FLEXIBLE SIGMOIDOSCOPY N/A 08/27/2022   Procedure: FLEXIBLE SIGMOIDOSCOPY;  Surgeon: Lynann Bologna, MD;  Location: [facility]  ENDOSCOPY;  Service: Gastroenterology;  Laterality: N/A;   JOINT REPLACEMENT  2010   right hip   LAPAROTOMY N/A 08/29/2022   Procedure: EXPLORATORY LAPAROTOMY, EXTENDED LEFT COLECTOMY WITH MOBILIZATION SPLENIC FLEXURE, TRANSVERSE COLOSTOMY;  Surgeon: Griselda Miner, MD;  Location: [facility] ORS;  Service: General;  Laterality: N/A;   LUMBAR LAMINECTOMY/DECOMPRESSION MICRODISCECTOMY Left 03/18/2013   Procedure: LEFT LUMBAR FOUR-FIVE,LUMBAR FIVE-SACRAL ONE MICRODISCECTOMY;  Surgeon: Reinaldo Meeker, MD;  Location: [facility]UR[facility];  Service: Neurosurgery;  Laterality: Left;  left   partial removal of right kidney  1971   pituitary tumor removed  1983   POLYPECTOMY     SHOULDER SURGERY Right     Prior to Admission medications   Medication Sig Start Date End Date Taking? Authorizing Provider  acetaminophen (TYLENOL) 500 MG tablet Take 1,000 mg by mouth every 6 (six) hours as needed for pain.    [provider]  albuterol (PROVENTIL HFA;VENTOLIN HFA) 108 (90 BASE) MCG/ACT inhaler Inhale 2 puffs into the lungs every 6 (six) hours as needed for wheezing.    [provider]  albuterol (PROVENTIL) (2.5 MG/3ML) 0.083% nebulizer solution Take 2.5 mg by nebulization every  4 (four) hours as needed for wheezing or shortness of breath. Patient not taking: Reported on 01/12/2023 04/18/16   [provider]  ALPRAZolam Prudy Feeler) 0.5 MG tablet Take 0.5 mg by mouth at bedtime as needed for sleep.    [provider]  amLODipine (NORVASC) 5 MG tablet Take 5 mg by mouth daily. 10/23/22   [provider]  atorvastatin (LIPITOR) 20 MG tablet Take 20 mg by mouth daily.    [provider]  cetirizine (ZYRTEC) 10 MG tablet Take 10 mg by mouth daily.    [provider]  Cyanocobalamin (B12 LIQUID HEALTH BOOSTER PO) Take 5,000 mcg by mouth daily.    [provider]  fluticasone (FLONASE) 50 MCG/ACT nasal spray Place 1 spray into both nostrils daily as needed for  allergies or rhinitis. 02/10/19   [provider]  hyoscyamine (LEVSIN SL) 0.125 MG SL tablet Place 1 tablet (0.125 mg total) under the tongue every 4 (four) hours as needed. Patient not taking: Reported on 10/31/2022 08/15/22   Iva Boop, MD  levothyroxine (SYNTHROID) 75 MCG tablet Take 75 mcg by mouth daily. 11/06/18   [provider]  liothyronine (CYTOMEL) 5 MCG tablet Take 5 mcg by mouth daily. 12/04/18   [provider]  lisinopril-hydrochlorothiazide (PRINZIDE,ZESTORETIC) 20-25 MG per tablet Take 1 tablet by mouth daily.    [provider]  methocarbamol (ROBAXIN) 500 MG tablet Take 1 tablet (500 mg total) by mouth every 6 (six) hours as needed for muscle spasms. Patient not taking: Reported on 10/31/2022 09/04/22   Barnetta Chapel, PA-C  montelukast (SINGULAIR) 10 MG tablet Take 10 mg by mouth daily.    [provider]  naproxen (NAPROSYN) 500 MG tablet Take 500 mg by mouth daily as needed for moderate pain. 01/25/19   [provider]  polyethylene glycol (MIRALAX / GLYCOLAX) 17 g packet Take 17 g by mouth daily. Patient not taking: Reported on 01/12/2023 09/09/22   Standley Brooking, MD  potassium chloride (KLOR-CON) 10 MEQ tablet Take 10 mEq by mouth daily. 10/29/15   [provider]  traMADol (ULTRAM) 50 MG tablet Take 1-2 tablets (50-100 mg total) by mouth every 6 (six) hours as needed for moderate pain or severe pain (50mg  mdoerate, 100mg  severe). 09/04/22   Barnetta Chapel, PA-C  venlafaxine XR (EFFEXOR-XR) 150 MG 24 hr capsule Take 150 mg by mouth 2 (two) times daily.    [provider]  Vitamin D, Ergocalciferol, (DRISDOL) 1.25 MG (50000 UNIT) CAPS capsule Take 50,000 Units by mouth every 7 (seven) days. 04/17/22   [provider]    Current Outpatient Medications  Medication Sig Dispense Refill   acetaminophen (TYLENOL) 500 MG tablet Take 1,000 mg by mouth every 6 (six) hours as needed for pain.      albuterol (PROVENTIL HFA;VENTOLIN HFA) 108 (90 BASE) MCG/ACT inhaler Inhale 2 puffs into the lungs every 6 (six) hours as needed for wheezing.     amLODipine (NORVASC) 5 MG tablet Take 5 mg by mouth daily.     atorvastatin (LIPITOR) 20 MG tablet Take 20 mg by mouth daily.     cetirizine (ZYRTEC) 10 MG tablet Take 10 mg by mouth daily.     Cyanocobalamin (B12 LIQUID HEALTH BOOSTER PO) Take 5,000 mcg by mouth daily.     fluticasone (FLONASE) 50 MCG/ACT nasal spray Place 1 spray into both nostrils daily as needed for allergies or rhinitis.     levothyroxine (SYNTHROID) 75 MCG tablet Take 75  mcg by mouth daily.     liothyronine (CYTOMEL) 5 MCG tablet Take 5 mcg by mouth daily.     lisinopril-hydrochlorothiazide (PRINZIDE,ZESTORETIC) 20-25 MG per tablet Take 1 tablet by mouth daily.     montelukast (SINGULAIR) 10 MG tablet Take 10 mg by mouth daily.     potassium chloride (KLOR-CON) 10 MEQ tablet Take 10 mEq by mouth daily.     venlafaxine XR (EFFEXOR-XR) 150 MG 24 hr capsule Take 150 mg by mouth 2 (two) times daily.     albuterol (PROVENTIL) (2.5 MG/3ML) 0.083% nebulizer solution Take 2.5 mg by nebulization every 4 (four) hours as needed for wheezing or shortness of breath. (Patient not taking: Reported on 01/12/2023)     ALPRAZolam (XANAX) 0.5 MG tablet Take 0.5 mg by mouth at bedtime as needed for sleep.     hyoscyamine (LEVSIN SL) 0.125 MG SL tablet Place 1 tablet (0.125 mg total) under the tongue every 4 (four) hours as needed. (Patient not taking: Reported on 10/31/2022) 30 tablet 0   methocarbamol (ROBAXIN) 500 MG tablet Take 1 tablet (500 mg total) by mouth every 6 (six) hours as needed for muscle spasms. (Patient not taking: Reported on 10/31/2022) 60 tablet 0   naproxen (NAPROSYN) 500 MG tablet Take 500 mg by mouth daily as needed for moderate pain.     traMADol (ULTRAM) 50 MG tablet Take 1-2 tablets (50-100 mg total) by mouth every 6 (six) hours as needed for moderate pain or severe pain (50mg   mdoerate, 100mg  severe). (Patient not taking: Reported on 01/17/2023) 30 tablet 0   Vitamin D, Ergocalciferol, (DRISDOL) 1.25 MG (50000 UNIT) CAPS capsule Take 50,000 Units by mouth every 7 (seven) days. (Patient not taking: Reported on 01/17/2023)     Current Facility-Administered Medications  Medication Dose Route Frequency Provider Last Rate Last Admin   0.9 %  sodium chloride infusion  500 mL Intravenous Continuous Iva Boop, MD        Allergies as of 01/17/2023 - Review Complete 01/17/2023  Allergen Reaction Noted   Other Nausea And Vomiting and Other (See Comments) 09/05/2022   Codeine Nausea And Vomiting 02/13/2013   Clarithromycin Rash 09/08/2014   Dicyclomine hcl  11/10/2019   Penicillins Rash 02/13/2013    Family History  Problem Relation Age of Onset   Asthma Mother    Allergic rhinitis Mother    Allergic rhinitis Father    Allergic rhinitis Sister    Allergic rhinitis Brother    Colon cancer Neg Hx    Esophageal cancer Neg Hx    Liver cancer Neg Hx    Pancreatic cancer Neg Hx    Rectal cancer Neg Hx    Stomach cancer Neg Hx    Colon polyps Neg Hx     Social History   Socioeconomic History   Marital status: Married    Spouse name: Not on file   Number of children: 1   Years of education: Not on file   Highest education level: Not on file  Occupational History   Occupation: retired  Tobacco Use   Smoking status: Never   Smokeless tobacco: Never  Vaping Use   Vaping status: Never Used  Substance and Sexual Activity   Alcohol use: Yes    Comment: rarely red wine   Drug use: No   Sexual activity: Yes  Other Topics Concern   Not on file  Social History Narrative   Married, on Medicare prior to 57 question disabled   Rare red  wine   Never smoker   No drug use   Social Determinants of Health   Financial Resource Strain: Not on file  Food Insecurity: No Food Insecurity (09/05/2022)   Hunger Vital Sign    Worried About Running Out of Food in the  Last Year: Never true    Ran Out of Food in the Last Year: Never true  Transportation Needs: No Transportation Needs (09/05/2022)   PRAPARE - Administrator, Civil Service (Medical): No    Lack of Transportation (Non-Medical): No  Physical Activity: Not on file  Stress: Not on file  Social Connections: Not on file  Intimate Partner Violence: Not At Risk (09/05/2022)   Humiliation, Afraid, Rape, and Kick questionnaire    Fear of Current or Ex-Partner: No    Emotionally Abused: No    Physically Abused: No    Sexually Abused: No    Review of Systems:  All other review of systems negative except as mentioned in the HPI.  Physical Exam: Vital signs BP 130/64 (BP Location: Right Arm, Patient Position: Sitting)   Pulse 84   Temp (!) 97.3 F (36.3 C)   Ht 5\' 4"  (1.626 m)   Wt 246 lb (111.6 kg)   SpO2 96%   BMI 42.23 kg/m   General:   Alert,  Well-developed, well-nourished, pleasant and cooperative in NAD Lungs:  Clear throughout to auscultation.   Heart:  Regular rate and rhythm; no murmurs, clicks, rubs,  or gallops. Abdomen:  Soft, nontender and nondistended. Normal bowel sounds.   Neuro/Psych:  Alert and cooperative. Normal mood and affect. A and O x 3   @Jennessy Sandridge  Sena Slate, MD, [facility] [phone] (pager) 01/17/2023 9:40 AM@

## 2023-01-17 ENCOUNTER — Encounter: Payer: Medicare Other | Admitting: Internal Medicine

## 2023-01-17 ENCOUNTER — Encounter: Payer: Self-pay | Admitting: Internal Medicine

## 2023-01-17 ENCOUNTER — Ambulatory Visit (AMBULATORY_SURGERY_CENTER): Payer: Medicare Other | Admitting: Internal Medicine

## 2023-01-17 VITALS — BP 116/67 | HR 72 | Temp 97.3°F | Resp 17 | Ht 64.0 in | Wt 246.0 lb

## 2023-01-17 DIAGNOSIS — K5732 Diverticulitis of large intestine without perforation or abscess without bleeding: Secondary | ICD-10-CM | POA: Diagnosis not present

## 2023-01-17 DIAGNOSIS — K50119 Crohn's disease of large intestine with unspecified complications: Secondary | ICD-10-CM

## 2023-01-17 MED ORDER — SODIUM CHLORIDE 0.9 % IV SOLN: 500.0000 mL | INTRAVENOUS | Status: DC

## 2023-01-17 NOTE — Progress Notes (Signed)
Pt's states no medical or surgical changes since previsit or office visit. 

## 2023-01-17 NOTE — Patient Instructions (Addendum)
Everything I saw looks normal. I took biopsies and will discuss whether you need to go back on Lialda - I am not sure.  I appreciate the opportunity to care for you. Iva Boop, MD, [facility]  Resume previous diet.  Continue present medications.  Await pathology results.  Should be ok to proceed with colostomy takedown.  Will review pathology and communicate to patient and surgeon.  She may need a flex sigmoidoscopy after takedown surgery to assess further hx of left-sided disease in past- she had responded to Lialda. Other option is just restart Lialda based upon history. I am not certain if the stricture was diverticular or Crohn's or both diseases.   YOU HAD AN ENDOSCOPIC PROCEDURE TODAY AT THE [facility]:   Refer to the procedure report that was given to you for any specific questions about what was found during the examination.  If the procedure report does not answer your questions, please call your gastroenterologist to clarify.  If you requested that your care partner not be given the details of your procedure findings, then the procedure report has been included in a sealed envelope for you to review at your convenience later.  YOU SHOULD EXPECT: Some feelings of bloating in the abdomen. Passage of more gas than usual.  Walking can help get rid of the air that was put into your GI tract during the procedure and reduce the bloating. If you had a lower endoscopy (such as a colonoscopy or flexible sigmoidoscopy) you may notice spotting of blood in your stool or on the toilet paper. If you underwent a bowel prep for your procedure, you may not have a normal bowel movement for a few days.  Please Note:  You might notice some irritation and congestion in your nose or some drainage.  This is from the oxygen used during your procedure.  There is no need for concern and it should clear up in a day or so.  SYMPTOMS TO REPORT IMMEDIATELY:  Following lower endoscopy (colonoscopy or  flexible sigmoidoscopy):  Excessive amounts of blood in the stool  Significant tenderness or worsening of abdominal pains  Swelling of the abdomen that is new, acute  Fever of 100F or higher  For urgent or emergent issues, a gastroenterologist can be reached at any hour by calling [phone]. Do not use MyChart messaging for urgent concerns.    DIET:  We do recommend a small meal at first, but then you may proceed to your regular diet.  Drink plenty of fluids but you should avoid alcoholic beverages for 24 hours.  ACTIVITY:  You should plan to take it easy for the rest of today and you should NOT DRIVE or use heavy machinery until tomorrow (because of the sedation medicines used during the test).    FOLLOW UP: Our staff will call the number listed on your records the next business day following your procedure.  We will call around 7:15- 8:00 am to check on you and address any questions or concerns that you may have regarding the information given to you following your procedure. If we do not reach you, we will leave a message.     If any biopsies were taken you will be contacted by phone or by letter within the next 1-3 weeks.  Please call us at [phone] if you have not heard about the biopsies in 3 weeks.    SIGNATURES/CONFIDENTIALITY: You and/or your care partner have signed paperwork which will be entered into  your electronic medical record.  These signatures attest to the fact that that the information above on your After Visit Summary has been reviewed and is understood.  Full responsibility of the confidentiality of this discharge information lies with you and/or your care-partner.

## 2023-01-17 NOTE — Op Note (Signed)
[facility] Patient Name: Whitney Higgins Procedure Date: 01/17/2023 9:42 AM MRN: [id number] Endoscopist: Iva Boop , MD, [id number] Age: 67 Referring MD:  Date of Birth: 12-13-1955 Gender: Female Account #: [id number] Procedure:                Colonoscopy Indications:              Crohn's disease of the colon, Follow-up of Crohn's                            disease of the colon - hx of left colon Crohn's -                            responded to Lialda then developed a left colon                            stricture - diverticular vs Crohn's vs both and had                            resection 4/24. Now for colostomy reversal. Medicines:                Monitored Anesthesia Care Procedure:                Pre-Anesthesia Assessment:                           - Prior to the procedure, a History and Physical                            was performed, and patient medications and                            allergies were reviewed. The patient's tolerance of                            previous anesthesia was also reviewed. The risks                            and benefits of the procedure and the sedation                            options and risks were discussed with the patient.                            All questions were answered, and informed consent                            was obtained. Prior Anticoagulants: The patient has                            taken no anticoagulant or antiplatelet agents. ASA                            Grade Assessment: III - A patient with severe  systemic disease. After reviewing the risks and                            benefits, the patient was deemed in satisfactory                            condition to undergo the procedure.                           After obtaining informed consent, the colonoscope                            was passed under direct vision. Throughout the                            procedure, the  patient's blood pressure, pulse, and                            oxygen saturations were monitored continuously. The                            [id number]-HQ190L Colonoscope [id number] was introduced                            through the transverse colostomy and advanced to                            the the cecum, identified by appendiceal orifice                            and ileocecal valve. The colonoscopy was performed                            without difficulty. The patient tolerated the                            procedure well. The quality of the bowel                            preparation was good. The ileocecal valve and the                            rectum were photographed. Appendiceal orifice seen                            but photo omitted. Scope In: 9:48:21 AM Scope Out: 9:58:39 AM Scope Withdrawal Time: 0 hours 5 minutes 53 seconds  Total Procedure Duration: 0 hours 10 minutes 18 seconds  Findings:                 The perianal and digital rectal examinations were                            normal.  There was evidence of an end colostomy in the                            transverse colon. This was characterized by healthy                            appearing mucosa.                           The colon (entire examined portion) appeared                            normal. Biopsies for histology were taken with a                            cold forceps from the right colon and transverse                            colon for evaluation of microscopic colitis.                            Verification of patient identification for the                            specimen was done. Estimated blood loss was minimal.                           Limited views of rectum were normal - there was                            retained stool. Complications:            No immediate complications. Estimated Blood Loss:     Estimated blood loss was minimal. Impression:                - End colostomy with healthy appearing mucosa in                            the transverse colon.                           - The entire examined colon is normal. Biopsied.                            Rectal stump inspected - some stool - looked normal. Recommendation:           - Patient has a contact number available for                            emergencies. The signs and symptoms of potential                            delayed complications were discussed with the  patient. Return to normal activities tomorrow.                            Written discharge instructions were provided to the                            patient.                           - Resume previous diet.                           - Continue present medications.                           - Await pathology results.                           - Should be ok to proceed with colostomy takedown.                           Will review pathology and communicate to patient                            and surgeon.                           She may need a flex sig after takedown surgery to                            assess further. hx left-sided disease in past - she                            had responded to Lialda. Other option is just                            restart Lialda based upon history. I am not certain                            if the stricture was diverticular or Crohn's or                            both diseases. Iva Boop, MD 01/17/2023 10:15:21 AM This report has been signed electronically.

## 2023-01-17 NOTE — Progress Notes (Signed)
Report given to PACU, vss 

## 2023-01-17 NOTE — Progress Notes (Signed)
Called to room to assist during endoscopic procedure.  Patient ID and intended procedure confirmed with present staff. Received instructions for my participation in the procedure from the performing physician.  

## 2023-01-18 ENCOUNTER — Telehealth: Payer: Self-pay | Admitting: *Deleted

## 2023-01-18 NOTE — Telephone Encounter (Signed)
  Follow up Call-     01/17/2023    8:52 AM  Call back number  Post procedure Call Back phone  # [phone]  Permission to leave phone message Yes     Patient questions:  Error

## 2023-01-19 ENCOUNTER — Encounter: Payer: Self-pay | Admitting: Internal Medicine

## 2023-01-22 NOTE — Progress Notes (Signed)
Left her another detailed message to please call me back and set up a January appointment.

## 2023-02-01 ENCOUNTER — Other Ambulatory Visit: Payer: Self-pay | Admitting: Surgery

## 2023-02-01 DIAGNOSIS — K5732 Diverticulitis of large intestine without perforation or abscess without bleeding: Secondary | ICD-10-CM

## 2023-02-01 DIAGNOSIS — K56699 Other intestinal obstruction unspecified as to partial versus complete obstruction: Secondary | ICD-10-CM

## 2023-02-02 ENCOUNTER — Ambulatory Visit: Payer: Medicare Other | Admitting: Internal Medicine

## 2023-02-13 ENCOUNTER — Ambulatory Visit
Admission: RE | Admit: 2023-02-13 | Discharge: 2023-02-13 | Disposition: A | Discharge status: Home / Self Care | Payer: Medicare Other | Source: Ambulatory Visit | Attending: Surgery

## 2023-02-13 DIAGNOSIS — K5732 Diverticulitis of large intestine without perforation or abscess without bleeding: Secondary | ICD-10-CM

## 2023-02-13 DIAGNOSIS — K56699 Other intestinal obstruction unspecified as to partial versus complete obstruction: Secondary | ICD-10-CM

## 2023-03-19 ENCOUNTER — Ambulatory Visit (HOSPITAL_COMMUNITY)
Admission: RE | Admit: 2023-03-19 | Discharge: 2023-03-19 | Disposition: A | Discharge status: Home / Self Care | Payer: Medicare Other | Source: Ambulatory Visit | Attending: Plastic Surgery | Admitting: Plastic Surgery

## 2023-03-19 DIAGNOSIS — L24B3 Irritant contact dermatitis related to fecal or urinary stoma or fistula: Secondary | ICD-10-CM

## 2023-03-19 DIAGNOSIS — Z433 Encounter for attention to colostomy: Secondary | ICD-10-CM | POA: Insufficient documentation

## 2023-03-19 DIAGNOSIS — R21 Rash and other nonspecific skin eruption: Secondary | ICD-10-CM | POA: Insufficient documentation

## 2023-03-19 NOTE — Progress Notes (Signed)
[facility]   Reason for visit:  LUQ end transverse colostomy with some peristomal skin breakdown HPI:  Crohn's disease with colectomy  and transverse end colostomy Past Medical History:  Diagnosis Date  . Abnormal alkaline phosphatase test 04/07/2019  . Allergy   . Anxiety    takes Xanax daily  . Arthritis   . Asthma    inhaler prn and Singulair daily;OTC Zyrtec daily  . Cataract   . Chronic back pain    stenosis  . COVID-19 05/2019  . Crohn's colitis (HCC) - left-sided 01/06/2019  . Depression    takes Effexor daily  . Diverticulosis   . Gallstones   . H/O: pituitary tumor   . History of blood transfusion    no abnormal reaction noted  . History of bronchitis    last time less than a yr ago  . History of colon polyps   . Hx of colonic polyps 01/10/2019  . Hyperlipidemia    takes Pravastatin daily  . Hypertension    takes Amlodipine and Lisinopril daily  . Hypothyroidism    takes synthroid and cytomel daily  . IBD (inflammatory bowel disease) UC vs Crohn's 01/06/2019  . Insomnia    takes Xanax nightly  . Joint pain   . Osteoporosis   . Pneumonia    hx of;last time about 9yrs ago  . PONV (postoperative nausea and vomiting)   . Shortness of breath    with exertion  . 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 Weakness    left leg   Family History  Problem Relation Age of Onset  . Asthma Mother   . Allergic rhinitis Mother   . Allergic rhinitis Father   . Allergic rhinitis Sister   . Allergic rhinitis Brother   . Colon cancer Neg Hx   . Esophageal cancer Neg Hx   . Liver cancer Neg Hx   . Pancreatic cancer Neg Hx   . Rectal cancer Neg Hx   . Stomach cancer Neg Hx   . Colon polyps Neg Hx    Allergies  Allergen Reactions  . Other Nausea And Vomiting and Other (See Comments)    PAIN MEDS AND A LOT OF ANTIBIOTICS CAN ONLY BE TOLERATED VIA IV- NOT BY MOUTH  ANESTHESIA- "I GET DEATHLY ILL"  . Codeine Nausea And Vomiting  .  Clarithromycin Rash  . Dicyclomine Hcl     Dizzy, and headache  . Penicillins Rash   Current Outpatient Medications  Medication Sig Dispense Refill Last Dose  . acetaminophen (TYLENOL) 500 MG tablet Take 1,000 mg by mouth every 6 (six) hours as needed for pain.     Marland Kitchen albuterol (PROVENTIL HFA;VENTOLIN HFA) 108 (90 BASE) MCG/ACT inhaler Inhale 2 puffs into the lungs every 6 (six) hours as needed for wheezing.     Marland Kitchen albuterol (PROVENTIL) (2.5 MG/3ML) 0.083% nebulizer solution Take 2.5 mg by nebulization every 4 (four) hours as needed for wheezing or shortness of breath. (Patient not taking: Reported on 01/12/2023)     . ALPRAZolam (XANAX) 0.5 MG tablet Take 0.5 mg by mouth at bedtime as needed for sleep.     Marland Kitchen amLODipine (NORVASC) 5 MG tablet Take 5 mg by mouth daily.     Marland Kitchen atorvastatin (LIPITOR) 20 MG tablet Take 20 mg by mouth daily.     . cetirizine (ZYRTEC) 10 MG tablet Take 10 mg by mouth daily.     . Cyanocobalamin (B12 LIQUID HEALTH BOOSTER PO) Take 5,000  mcg by mouth daily.     . fluticasone (FLONASE) 50 MCG/ACT nasal spray Place 1 spray into both nostrils daily as needed for allergies or rhinitis.     . hyoscyamine (LEVSIN SL) 0.125 MG SL tablet Place 1 tablet (0.125 mg total) under the tongue every 4 (four) hours as needed. (Patient not taking: Reported on 10/31/2022) 30 tablet 0   . levothyroxine (SYNTHROID) 75 MCG tablet Take 75 mcg by mouth daily.     Marland Kitchen liothyronine (CYTOMEL) 5 MCG tablet Take 5 mcg by mouth daily.     Marland Kitchen lisinopril-hydrochlorothiazide (PRINZIDE,ZESTORETIC) 20-25 MG per tablet Take 1 tablet by mouth daily.     . methocarbamol (ROBAXIN) 500 MG tablet Take 1 tablet (500 mg total) by mouth every 6 (six) hours as needed for muscle spasms. (Patient not taking: Reported on 10/31/2022) 60 tablet 0   . montelukast (SINGULAIR) 10 MG tablet Take 10 mg by mouth daily.     . naproxen (NAPROSYN) 500 MG tablet Take 500 mg by mouth daily as needed for moderate pain.     . potassium  chloride (KLOR-CON) 10 MEQ tablet Take 10 mEq by mouth daily.     . traMADol (ULTRAM) 50 MG tablet Take 1-2 tablets (50-100 mg total) by mouth every 6 (six) hours as needed for moderate pain or severe pain (50mg  mdoerate, 100mg  severe). (Patient not taking: Reported on 01/17/2023) 30 tablet 0   . venlafaxine XR (EFFEXOR-XR) 150 MG 24 hr capsule Take 150 mg by mouth 2 (two) times daily.     . Vitamin D, Ergocalciferol, (DRISDOL) 1.25 MG (50000 UNIT) CAPS capsule Take 50,000 Units by mouth every 7 (seven) days. (Patient not taking: Reported on 01/17/2023)      No current facility-administered medications for this encounter.   ROS  Review of Systems  Gastrointestinal:        LUQ end transverse colostomy  Skin:  Positive for rash.       Peristomal skin breakdown  Psychiatric/Behavioral: Negative.    All other systems reviewed and are negative. Vital signs:  There were no vitals taken for this visit. Exam:  Physical Exam Constitutional:      Appearance: She is obese.  Abdominal:     Palpations: Abdomen is soft.  Skin:    General: Skin is warm and dry.     Findings: Erythema present.  Neurological:     Mental Status: She is alert and oriented to person, place, and time.  Psychiatric:        Mood and Affect: Mood normal.        Behavior: Behavior normal.    Stoma type/location:  LUQ end colostomy Stomal assessment/size:  1 1/2 " slightly oval Peristomal assessment:  partial thickness tissue loss to peristomal skin from 12 to 4 o'clock.  WIll implement powder and skin prep.  Continue barrier ring and add ostomy belt for more secure fit.  Measured stoma and provided new pattern for barrier to cut to fit. Was cutting a little too large.    Treatment options for stomal/peristomal skin: barrier ring   stoma powder and skin prep   add ostomy belt for support Output: thick brown stool Ostomy pouching: 1pc. convex Education provided:  performed pouch change  demonstrated skin prep and powder and  ostomy belt.     Impression/dx  Irritant contact dermatitis  End colostomy Discussion  Must cut barrier opening to appropriate size.  Barrier ring to protect skin  stoma powder and skin prep to  promote healing to peristomal skin.  Plan  See back as needed.     Visit time: 45 minutes.   Maple Hudson FNP-BC

## 2023-03-21 ENCOUNTER — Other Ambulatory Visit: Payer: Self-pay | Admitting: Urology

## 2023-03-23 DIAGNOSIS — L24B3 Irritant contact dermatitis related to fecal or urinary stoma or fistula: Secondary | ICD-10-CM | POA: Insufficient documentation

## 2023-03-23 DIAGNOSIS — Z433 Encounter for attention to colostomy: Secondary | ICD-10-CM | POA: Insufficient documentation

## 2023-05-10 ENCOUNTER — Ambulatory Visit: Payer: Self-pay | Admitting: Surgery

## 2023-05-10 DIAGNOSIS — Z01818 Encounter for other preprocedural examination: Secondary | ICD-10-CM

## 2023-05-10 NOTE — Progress Notes (Signed)
Surgery orders requested via Epic inbox. °

## 2023-05-14 NOTE — Patient Instructions (Signed)
 SURGICAL WAITING ROOM VISITATION  Patients having surgery or a procedure may have no more than 2 support people in the waiting area - these visitors may rotate.    Children under the age of 12 must have an adult with them who is not the patient.  Due to an increase in RSV and influenza rates and associated hospitalizations, children ages 34 and under may not visit patients in [facility] hospitals.  If the patient needs to stay at the hospital during part of their recovery, the visitor guidelines for inpatient rooms apply. Pre-op nurse will coordinate an appropriate time for 1 support person to accompany patient in pre-op.  This support person may not rotate.    Please refer to the [facility] website for the visitor guidelines for Inpatients (after your surgery is over and you are in a regular room).       Your procedure is scheduled on:  05/25/2023    Report to [facility] Main Entrance    Report to admitting at   [phone]   Call this number if you have problems the morning of surgery [phone]   Do not eat food :After Midnight.   After Midnight you may have the following liquids until ___ 0430___ AM DAY OF SURGERY  Water  Non-Citrus Juices (without pulp, NO RED-Apple, White grape, White cranberry) Black Coffee (NO MILK/CREAM OR CREAMERS, sugar ok)  Clear Tea (NO MILK/CREAM OR CREAMERS, sugar ok) regular and decaf                             Plain Jell-O (NO RED)                                           Fruit ices (not with fruit pulp, NO RED)                                     Popsicles (NO RED)                                                               Sports drinks like Gatorade (NO RED)              Drink 2 Ensure/G2 drinks AT 10:00 PM the night before surgery.        The day of surgery:  Drink ONE (1) Pre-Surgery Clear Ensure or G2 at 0430 AM  ( have completed bny ) the morning of surgery. Drink in one sitting. Do not sip.  This drink was given to you  during your hospital  pre-op appointment visit. Nothing else to drink after completing the  Pre-Surgery Clear Ensure or G2.          If you have questions, please contact your surgeon's office.   FOLLOW BOWEL PREP AND ANY ADDITIONAL PRE OP INSTRUCTIONS YOU RECEIVED FROM YOUR SURGEON'S OFFICE!!!     Oral Hygiene is also important to reduce your risk of infection.  Remember - BRUSH YOUR TEETH THE MORNING OF SURGERY WITH YOUR REGULAR TOOTHPASTE  DENTURES WILL BE REMOVED PRIOR TO SURGERY PLEASE DO NOT APPLY Poly grip OR ADHESIVES!!!   Do NOT smoke after Midnight   Stop all vitamins and herbal supplements 7 days before surgery.   Take these medicines the morning of surgery with A SIP OF WATER :  Inhalers as usual and bring, amlodipine , allegra, cytomel , synthroid , effexor    DO NOT TAKE ANY ORAL DIABETIC MEDICATIONS DAY OF YOUR SURGERY  Bring CPAP mask and tubing day of surgery.                              You may not have any metal on your body including hair pins, jewelry, and body piercing             Do not wear make-up, lotions, powders, perfumes/cologne, or deodorant  Do not wear nail polish including gel and S&S, artificial/acrylic nails, or any other type of covering on natural nails including finger and toenails. If you have artificial nails, gel coating, etc. that needs to be removed by a nail salon please have this removed prior to surgery or surgery may need to be canceled/ delayed if the surgeon/ anesthesia feels like they are unable to be safely monitored.   Do not shave  48 hours prior to surgery.               Men may shave face and neck.   Do not bring valuables to the hospital. [facility] IS NOT             RESPONSIBLE   FOR VALUABLES.   Contacts, glasses, dentures or bridgework may not be worn into surgery.   Bring small overnight bag day of surgery.   DO NOT BRING YOUR HOME MEDICATIONS TO THE HOSPITAL. PHARMACY WILL  DISPENSE MEDICATIONS LISTED ON YOUR MEDICATION LIST TO YOU DURING YOUR ADMISSION IN THE HOSPITAL!    Patients discharged on the day of surgery will not be allowed to drive home.  Someone NEEDS to stay with you for the first 24 hours after anesthesia.   Special Instructions: Bring a copy of your healthcare power of attorney and living will documents the day of surgery if you haven't scanned them before.              Please read over the following fact sheets you were given: IF YOU HAVE QUESTIONS ABOUT YOUR PRE-OP INSTRUCTIONS PLEASE CALL [phone]   If you received a COVID test during your pre-op visit  it is requested that you wear a mask when out in public, stay away from anyone that may not be feeling well and notify your surgeon if you develop symptoms. If you test positive for Covid or have been in contact with anyone that has tested positive in the last 10 days please notify you surgeon.    [facility] - Preparing for Surgery Before surgery, you can play an important role.  Because skin is not sterile, your skin needs to be as free of germs as possible.  You can reduce the number of germs on your skin by washing with CHG (chlorahexidine gluconate) soap before surgery.  CHG is an antiseptic cleaner which kills germs and bonds with the skin to continue killing germs even after washing. Please DO NOT use if you have an allergy to CHG or antibacterial soaps.  If your skin becomes reddened/irritated stop  using the CHG and inform your nurse when you arrive at Short Stay. Do not shave (including legs and underarms) for at least 48 hours prior to the first CHG shower.  You may shave your face/neck. Please follow these instructions carefully:  1.  Shower with CHG Soap the night before surgery and the  morning of Surgery.  2.  If you choose to wash your hair, wash your hair first as usual with your  normal  shampoo.  3.  After you shampoo, rinse your hair and body thoroughly to remove the  shampoo.                            4.  Use CHG as you would any other liquid soap.  You can apply chg directly  to the skin and wash                       Gently with a scrungie or clean washcloth.  5.  Apply the CHG Soap to your body ONLY FROM THE NECK DOWN.   Do not use on face/ open                           Wound or open sores. Avoid contact with eyes, ears mouth and genitals (private parts).                       Wash face,  Genitals (private parts) with your normal soap.             6.  Wash thoroughly, paying special attention to the area where your surgery  will be performed.  7.  Thoroughly rinse your body with warm water  from the neck down.  8.  DO NOT shower/wash with your normal soap after using and rinsing off  the CHG Soap.                9.  Pat yourself dry with a clean towel.            10.  Wear clean pajamas.            11.  Place clean sheets on your bed the night of your first shower and do not  sleep with pets. Day of Surgery : Do not apply any lotions/deodorants the morning of surgery.  Please wear clean clothes to the hospital/surgery center.  FAILURE TO FOLLOW THESE INSTRUCTIONS MAY RESULT IN THE CANCELLATION OF YOUR SURGERY PATIENT SIGNATURE_________________________________  NURSE SIGNATURE__________________________________  ________________________________________________________________________

## 2023-05-14 NOTE — Progress Notes (Addendum)
 Anesthesia Review:  PCP: DR  Ole Pepper- LOV Sinusitis on 04/24/23  Cardiologist : Endocrinologist- DR doerr  Chest x-ray : EKG : 05/18/23  Echo : Stress test: Cardiac Cath :  Activity level: can do a flight of stairs without difficulty  Sleep Study/ CPAP : none  Fasting Blood Sugar :      / Checks Blood Sugar -- times a day:   Blood Thinner/ Instructions /Last Dose: ASA / Instructions/ Last Dose :    PT did not have bowel prep instrucitons at time of preop appt.  Called [facility] and spoke with [facility] in Triage.  She will email to preop nurse copy of bowel prep instrucitons.  Bowel prep instructions given to pt and reviewed with pt.  Copy also on chart.  PT aware if she does not have scripts at drugstore for natibiotics to copy [facility].  PT voiced understanding.    PT also aware to bring colostomy supplies DOS.    Pt with recent sinusitis on 04/24/23 seen by Ole Pepper .  PT states at preop on 05/18/23 no longer with any symptoms.    Pt unaware at preop what [facility] is doing.  Consent placed on front of chart to be signed DOS.     Blood pressure 182/99 at preop appt.  PT denies any chest pain, shortness of breath, headache, or blurred vision at preop appt.     CMP done 05/18/23 routed to Dr JAYSON Pizza on 05/18/23.

## 2023-05-17 ENCOUNTER — Other Ambulatory Visit: Payer: Self-pay | Admitting: Urology

## 2023-05-18 ENCOUNTER — Encounter (HOSPITAL_COMMUNITY)
Admission: RE | Admit: 2023-05-18 | Discharge: 2023-05-18 | Disposition: A | Discharge status: Home / Self Care | Payer: Medicare Other | Source: Ambulatory Visit | Attending: Surgery | Admitting: Surgery

## 2023-05-18 ENCOUNTER — Encounter (HOSPITAL_COMMUNITY): Payer: Self-pay

## 2023-05-18 ENCOUNTER — Other Ambulatory Visit: Payer: Self-pay

## 2023-05-18 DIAGNOSIS — R9431 Abnormal electrocardiogram [ECG] [EKG]: Secondary | ICD-10-CM | POA: Diagnosis not present

## 2023-05-18 DIAGNOSIS — Z01818 Encounter for other preprocedural examination: Secondary | ICD-10-CM | POA: Insufficient documentation

## 2023-05-18 LAB — CBC WITH DIFFERENTIAL/PLATELET
Abs Immature Granulocytes: 0.02 10*3/uL (ref 0.00–0.07)
Basophils Absolute: 0.1 10*3/uL (ref 0.0–0.1)
Basophils Relative: 1 %
Eosinophils Absolute: 0.2 10*3/uL (ref 0.0–0.5)
Eosinophils Relative: 2 %
HCT: 45.9 % (ref 36.0–46.0)
Hemoglobin: 14.7 g/dL (ref 12.0–15.0)
Immature Granulocytes: 0 %
Lymphocytes Relative: 18 %
Lymphs Abs: 1.4 10*3/uL (ref 0.7–4.0)
MCH: 28.2 pg (ref 26.0–34.0)
MCHC: 32 g/dL (ref 30.0–36.0)
MCV: 88.1 fL (ref 80.0–100.0)
Monocytes Absolute: 0.7 10*3/uL (ref 0.1–1.0)
Monocytes Relative: 9 %
Neutro Abs: 5.3 10*3/uL (ref 1.7–7.7)
Neutrophils Relative %: 70 %
Platelets: 185 10*3/uL (ref 150–400)
RBC: 5.21 MIL/uL — ABNORMAL HIGH (ref 3.87–5.11)
RDW: 13.8 % (ref 11.5–15.5)
WBC: 7.6 10*3/uL (ref 4.0–10.5)
nRBC: 0 % (ref 0.0–0.2)

## 2023-05-18 LAB — COMPREHENSIVE METABOLIC PANEL
ALT: 22 U/L (ref 0–44)
AST: 23 U/L (ref 15–41)
Albumin: 3.7 g/dL (ref 3.5–5.0)
Alkaline Phosphatase: 127 U/L — ABNORMAL HIGH (ref 38–126)
Anion gap: 8 (ref 5–15)
BUN: 21 mg/dL (ref 8–23)
CO2: 30 mmol/L (ref 22–32)
Calcium: 9.1 mg/dL (ref 8.9–10.3)
Chloride: 96 mmol/L — ABNORMAL LOW (ref 98–111)
Creatinine, Ser: 0.91 mg/dL (ref 0.44–1.00)
GFR, Estimated: >60 mL/min (ref >60)
Glucose, Bld: 97 mg/dL (ref 70–99)
Potassium: 3.9 mmol/L (ref 3.5–5.1)
Sodium: 134 mmol/L — ABNORMAL LOW (ref 135–145)
Total Bilirubin: 0.4 mg/dL (ref 0.0–1.2)
Total Protein: 7.5 g/dL (ref 6.5–8.1)

## 2023-05-24 NOTE — Anesthesia Preprocedure Evaluation (Addendum)
 Anesthesia Evaluation  Patient identified by MRN, date of birth, ID band Patient awake    Reviewed: Allergy & Precautions, NPO status , Patient's Chart, lab work & pertinent test results  History of Anesthesia Complications (+) PONV and history of anesthetic complications  Airway Mallampati: II  TM Distance: >3 FB Neck ROM: Full    Dental  (+) Dental Advisory Given   Pulmonary asthma    breath sounds clear to auscultation       Cardiovascular hypertension, Pt. on medications + CAD   Rhythm:Regular Rate:Normal     Neuro/Psych  Headaches PSYCHIATRIC DISORDERS Anxiety Depression       GI/Hepatic Neg liver ROS,GERD  Controlled,,  Endo/Other  Hypothyroidism  Class 3 obesity Pituitary tumor s/p excision   Renal/GU  Partial nephrectomy      Musculoskeletal  (+) Arthritis ,    Abdominal   Peds  Hematology negative hematology ROS (+)   Anesthesia Other Findings   Reproductive/Obstetrics                             Anesthesia Physical Anesthesia Plan  ASA: 3  Anesthesia Plan: General   Post-op Pain Management: Ketamine  IV* and Tylenol  PO (pre-op)*   Induction: Intravenous and Rapid sequence  PONV Risk Score and Plan: 4 or greater and Treatment may vary due to age or medical condition, Ondansetron , Dexamethasone , Midazolam , Scopolamine  patch - Pre-op and Propofol  infusion  Airway Management Planned: Oral ETT  Additional Equipment: None  Intra-op Plan:   Post-operative Plan: Extubation in OR  Informed Consent: I have reviewed the patients History and Physical, chart, labs and discussed the procedure including the risks, benefits and alternatives for the proposed anesthesia with the patient or authorized representative who has indicated his/her understanding and acceptance.     Dental advisory given  Plan Discussed with: CRNA  Anesthesia Plan Comments:        Anesthesia  Quick Evaluation

## 2023-05-25 ENCOUNTER — Encounter (HOSPITAL_COMMUNITY): Payer: Self-pay | Admitting: Surgery

## 2023-05-25 ENCOUNTER — Encounter (HOSPITAL_COMMUNITY)
Admission: RE | Disposition: A | Discharge status: Home / Self Care | Payer: Self-pay | Source: Home / Self Care | Attending: Surgery

## 2023-05-25 ENCOUNTER — Other Ambulatory Visit: Payer: Self-pay

## 2023-05-25 ENCOUNTER — Inpatient Hospital Stay (HOSPITAL_COMMUNITY): Payer: Medicare Other | Admitting: Physician Assistant

## 2023-05-25 ENCOUNTER — Inpatient Hospital Stay (HOSPITAL_COMMUNITY)
Admission: RE | Admit: 2023-05-25 | Discharge: 2023-05-30 | DRG: 330 | Disposition: A | Discharge status: Home / Self Care | Payer: Medicare Other | Attending: Surgery | Admitting: Surgery

## 2023-05-25 ENCOUNTER — Inpatient Hospital Stay (HOSPITAL_COMMUNITY): Payer: Medicare Other | Admitting: Anesthesiology

## 2023-05-25 DIAGNOSIS — G4700 Insomnia, unspecified: Secondary | ICD-10-CM | POA: Diagnosis present

## 2023-05-25 DIAGNOSIS — K219 Gastro-esophageal reflux disease without esophagitis: Secondary | ICD-10-CM | POA: Diagnosis present

## 2023-05-25 DIAGNOSIS — Z8616 Personal history of COVID-19: Secondary | ICD-10-CM | POA: Diagnosis not present

## 2023-05-25 DIAGNOSIS — Z433 Encounter for attention to colostomy: Secondary | ICD-10-CM | POA: Diagnosis present

## 2023-05-25 DIAGNOSIS — Z885 Allergy status to narcotic agent status: Secondary | ICD-10-CM | POA: Diagnosis not present

## 2023-05-25 DIAGNOSIS — E039 Hypothyroidism, unspecified: Secondary | ICD-10-CM | POA: Diagnosis present

## 2023-05-25 DIAGNOSIS — N9972 Accidental puncture and laceration of a genitourinary system organ or structure during other procedure: Secondary | ICD-10-CM | POA: Diagnosis not present

## 2023-05-25 DIAGNOSIS — J4551 Severe persistent asthma with (acute) exacerbation: Secondary | ICD-10-CM

## 2023-05-25 DIAGNOSIS — Z5331 Laparoscopic surgical procedure converted to open procedure: Secondary | ICD-10-CM

## 2023-05-25 DIAGNOSIS — E785 Hyperlipidemia, unspecified: Secondary | ICD-10-CM | POA: Diagnosis present

## 2023-05-25 DIAGNOSIS — I10 Essential (primary) hypertension: Secondary | ICD-10-CM

## 2023-05-25 DIAGNOSIS — I2510 Atherosclerotic heart disease of native coronary artery without angina pectoris: Secondary | ICD-10-CM | POA: Diagnosis present

## 2023-05-25 DIAGNOSIS — Z79899 Other long term (current) drug therapy: Secondary | ICD-10-CM

## 2023-05-25 DIAGNOSIS — Z6841 Body Mass Index (BMI) 40.0 and over, adult: Secondary | ICD-10-CM | POA: Diagnosis not present

## 2023-05-25 DIAGNOSIS — E871 Hypo-osmolality and hyponatremia: Secondary | ICD-10-CM | POA: Diagnosis present

## 2023-05-25 DIAGNOSIS — Z79890 Hormone replacement therapy (postmenopausal): Secondary | ICD-10-CM | POA: Diagnosis not present

## 2023-05-25 DIAGNOSIS — Z860100 Personal history of colon polyps, unspecified: Secondary | ICD-10-CM

## 2023-05-25 DIAGNOSIS — Z880 Allergy status to penicillin: Secondary | ICD-10-CM

## 2023-05-25 DIAGNOSIS — Z881 Allergy status to other antibiotic agents status: Secondary | ICD-10-CM | POA: Diagnosis not present

## 2023-05-25 DIAGNOSIS — K5010 Crohn's disease of large intestine without complications: Secondary | ICD-10-CM | POA: Diagnosis present

## 2023-05-25 DIAGNOSIS — Z905 Acquired absence of kidney: Secondary | ICD-10-CM | POA: Diagnosis not present

## 2023-05-25 DIAGNOSIS — Z825 Family history of asthma and other chronic lower respiratory diseases: Secondary | ICD-10-CM

## 2023-05-25 DIAGNOSIS — E66813 Obesity, class 3: Secondary | ICD-10-CM | POA: Diagnosis present

## 2023-05-25 DIAGNOSIS — N179 Acute kidney failure, unspecified: Secondary | ICD-10-CM | POA: Diagnosis not present

## 2023-05-25 DIAGNOSIS — Z9889 Other specified postprocedural states: Principal | ICD-10-CM

## 2023-05-25 DIAGNOSIS — K660 Peritoneal adhesions (postprocedural) (postinfection): Secondary | ICD-10-CM | POA: Diagnosis present

## 2023-05-25 DIAGNOSIS — F32A Depression, unspecified: Secondary | ICD-10-CM | POA: Diagnosis present

## 2023-05-25 DIAGNOSIS — Z933 Colostomy status: Secondary | ICD-10-CM | POA: Diagnosis not present

## 2023-05-25 DIAGNOSIS — N736 Female pelvic peritoneal adhesions (postinfective): Secondary | ICD-10-CM

## 2023-05-25 DIAGNOSIS — Y838 Other surgical procedures as the cause of abnormal reaction of the patient, or of later complication, without mention of misadventure at the time of the procedure: Secondary | ICD-10-CM | POA: Diagnosis not present

## 2023-05-25 DIAGNOSIS — F419 Anxiety disorder, unspecified: Secondary | ICD-10-CM | POA: Diagnosis present

## 2023-05-25 DIAGNOSIS — Z9049 Acquired absence of other specified parts of digestive tract: Secondary | ICD-10-CM | POA: Diagnosis not present

## 2023-05-25 HISTORY — PX: LYSIS OF ADHESION: SHX5961

## 2023-05-25 HISTORY — PX: CYSTOSCOPY WITH STENT PLACEMENT: SHX5790

## 2023-05-25 HISTORY — PX: FLEXIBLE SIGMOIDOSCOPY: SHX5431

## 2023-05-25 HISTORY — PX: XI ROBOTIC ASSISTED COLOSTOMY TAKEDOWN: SHX6828

## 2023-05-25 LAB — ABO/RH: ABO/RH(D): O NEG

## 2023-05-25 LAB — TYPE AND SCREEN
ABO/RH(D): O NEG
Antibody Screen: NEGATIVE

## 2023-05-25 SURGERY — CLOSURE, COLOSTOMY, ROBOT-ASSISTED
Anesthesia: General | Site: Bladder

## 2023-05-25 MED ORDER — KCL IN DEXTROSE-NACL 20-5-0.45 MEQ/L-%-% IV SOLN
INTRAVENOUS | Status: AC
  Filled 2023-05-25 (×3): qty 1000

## 2023-05-25 MED ORDER — HYDROMORPHONE HCL 1 MG/ML IJ SOLN
0.5000 mg | INTRAMUSCULAR | Status: DC | PRN
  Administered 2023-05-25 – 2023-05-27 (×7): 0.5 mg via INTRAVENOUS
  Filled 2023-05-25 (×8): qty 0.5

## 2023-05-25 MED ORDER — LISINOPRIL 20 MG PO TABS
20.0000 mg | ORAL_TABLET | Freq: Every day | ORAL | Status: DC
  Administered 2023-05-26 – 2023-05-30 (×5): 20 mg via ORAL
  Filled 2023-05-25 (×5): qty 1

## 2023-05-25 MED ORDER — BUPIVACAINE-EPINEPHRINE 0.25% -1:200000 IJ SOLN
INTRAMUSCULAR | Status: AC
  Filled 2023-05-25: qty 1

## 2023-05-25 MED ORDER — AMLODIPINE BESYLATE 5 MG PO TABS
5.0000 mg | ORAL_TABLET | Freq: Every day | ORAL | Status: DC
  Administered 2023-05-26 – 2023-05-30 (×5): 5 mg via ORAL
  Filled 2023-05-25 (×5): qty 1

## 2023-05-25 MED ORDER — POLYETHYLENE GLYCOL 3350 17 GM/SCOOP PO POWD: 1.0000 | Freq: Once | ORAL | Status: DC

## 2023-05-25 MED ORDER — ACETAMINOPHEN 500 MG PO TABS
1000.0000 mg | ORAL_TABLET | Freq: Four times a day (QID) | ORAL | Status: DC
  Administered 2023-05-25 – 2023-05-30 (×17): 1000 mg via ORAL
  Filled 2023-05-25 (×18): qty 2

## 2023-05-25 MED ORDER — BUPIVACAINE-EPINEPHRINE (PF) 0.25% -1:200000 IJ SOLN
INTRAMUSCULAR | Status: DC | PRN
  Administered 2023-05-25: 30 mL via PERINEURAL

## 2023-05-25 MED ORDER — SCOPOLAMINE 1 MG/3DAYS TD PT72
1.0000 | MEDICATED_PATCH | TRANSDERMAL | Status: DC
  Filled 2023-05-25: qty 1

## 2023-05-25 MED ORDER — VENLAFAXINE HCL ER 150 MG PO CP24
150.0000 mg | ORAL_CAPSULE | Freq: Two times a day (BID) | ORAL | Status: DC
  Administered 2023-05-26 – 2023-05-30 (×9): 150 mg via ORAL
  Filled 2023-05-25 (×10): qty 1

## 2023-05-25 MED ORDER — ENSURE SURGERY PO LIQD
237.0000 mL | Freq: Two times a day (BID) | ORAL | Status: DC
  Administered 2023-05-29: 237 mL via ORAL

## 2023-05-25 MED ORDER — SUGAMMADEX SODIUM 200 MG/2ML IV SOLN
INTRAVENOUS | Status: DC | PRN
  Administered 2023-05-25: 300 mg via INTRAVENOUS

## 2023-05-25 MED ORDER — BUPIVACAINE LIPOSOME 1.3 % IJ SUSP
INTRAMUSCULAR | Status: DC | PRN
  Administered 2023-05-25: 20 mL

## 2023-05-25 MED ORDER — AMISULPRIDE (ANTIEMETIC) 5 MG/2ML IV SOLN: 10.0000 mg | Freq: Once | INTRAVENOUS | Status: DC | PRN

## 2023-05-25 MED ORDER — SODIUM CHLORIDE (PF) 0.9 % IJ SOLN
INTRAMUSCULAR | Status: DC | PRN
  Administered 2023-05-25: 10 mL

## 2023-05-25 MED ORDER — ONDANSETRON HCL 4 MG/2ML IJ SOLN
4.0000 mg | Freq: Once | INTRAMUSCULAR | Status: AC
  Administered 2023-05-25: 4 mg via INTRAVENOUS

## 2023-05-25 MED ORDER — VASOPRESSIN 20 UNIT/ML IV SOLN
INTRAVENOUS | Status: DC | PRN
  Administered 2023-05-25 (×3): 1 [IU] via INTRAVENOUS

## 2023-05-25 MED ORDER — ONDANSETRON HCL 4 MG/2ML IJ SOLN
INTRAMUSCULAR | Status: DC | PRN
  Administered 2023-05-25: 4 mg via INTRAVENOUS

## 2023-05-25 MED ORDER — DIPHENHYDRAMINE HCL 50 MG/ML IJ SOLN: 12.5000 mg | Freq: Four times a day (QID) | INTRAMUSCULAR | Status: DC | PRN

## 2023-05-25 MED ORDER — ROCURONIUM BROMIDE 100 MG/10ML IV SOLN
INTRAVENOUS | Status: DC | PRN
  Administered 2023-05-25 (×2): 20 mg via INTRAVENOUS
  Administered 2023-05-25: 30 mg via INTRAVENOUS
  Administered 2023-05-25: 20 mg via INTRAVENOUS
  Administered 2023-05-25: 30 mg via INTRAVENOUS
  Administered 2023-05-25: 40 mg via INTRAVENOUS

## 2023-05-25 MED ORDER — LIOTHYRONINE SODIUM 5 MCG PO TABS
5.0000 ug | ORAL_TABLET | Freq: Two times a day (BID) | ORAL | Status: DC
  Administered 2023-05-26 – 2023-05-30 (×9): 5 ug via ORAL
  Filled 2023-05-25 (×10): qty 1

## 2023-05-25 MED ORDER — ACETAMINOPHEN 500 MG PO TABS
1000.0000 mg | ORAL_TABLET | ORAL | Status: DC
  Filled 2023-05-25: qty 2

## 2023-05-25 MED ORDER — PROPOFOL 10 MG/ML IV BOLUS
INTRAVENOUS | Status: DC | PRN
  Administered 2023-05-25: 180 ug/kg/min via INTRAVENOUS
  Administered 2023-05-25: 200 mg via INTRAVENOUS

## 2023-05-25 MED ORDER — ALVIMOPAN 12 MG PO CAPS
12.0000 mg | ORAL_CAPSULE | ORAL | Status: DC
  Filled 2023-05-25: qty 1

## 2023-05-25 MED ORDER — SIMETHICONE 80 MG PO CHEW: 40.0000 mg | CHEWABLE_TABLET | Freq: Four times a day (QID) | ORAL | Status: DC | PRN

## 2023-05-25 MED ORDER — PHENYLEPHRINE HCL (PRESSORS) 10 MG/ML IV SOLN
INTRAVENOUS | Status: DC | PRN
  Administered 2023-05-25: 240 ug via INTRAVENOUS
  Administered 2023-05-25: 160 ug via INTRAVENOUS
  Administered 2023-05-25: 240 ug via INTRAVENOUS
  Administered 2023-05-25: 160 ug via INTRAVENOUS
  Administered 2023-05-25 (×2): 120 ug via INTRAVENOUS

## 2023-05-25 MED ORDER — LACTATED RINGERS IR SOLN
Status: DC | PRN
  Administered 2023-05-25: 1000 mL

## 2023-05-25 MED ORDER — ENSURE PRE-SURGERY PO LIQD
296.0000 mL | Freq: Once | ORAL | Status: DC
  Filled 2023-05-25: qty 296

## 2023-05-25 MED ORDER — VASOPRESSIN 20 UNIT/ML IV SOLN
INTRAVENOUS | Status: AC
  Filled 2023-05-25: qty 1

## 2023-05-25 MED ORDER — HEPARIN SODIUM (PORCINE) 5000 UNIT/ML IJ SOLN
5000.0000 [IU] | Freq: Once | INTRAMUSCULAR | Status: AC
  Administered 2023-05-25: 5000 [IU] via SUBCUTANEOUS
  Filled 2023-05-25: qty 1

## 2023-05-25 MED ORDER — BISACODYL 5 MG PO TBEC: 20.0000 mg | DELAYED_RELEASE_TABLET | Freq: Once | ORAL | Status: DC

## 2023-05-25 MED ORDER — SUCCINYLCHOLINE CHLORIDE 200 MG/10ML IV SOSY
PREFILLED_SYRINGE | INTRAVENOUS | Status: DC | PRN
Start: 2023-05-25 — End: 2023-05-25
  Administered 2023-05-25: 120 mg via INTRAVENOUS

## 2023-05-25 MED ORDER — LIDOCAINE HCL 2 % IJ SOLN
INTRAMUSCULAR | Status: AC
  Filled 2023-05-25: qty 20

## 2023-05-25 MED ORDER — HYDRALAZINE HCL 20 MG/ML IJ SOLN: 10.0000 mg | INTRAMUSCULAR | Status: DC | PRN

## 2023-05-25 MED ORDER — LORATADINE 10 MG PO TABS
10.0000 mg | ORAL_TABLET | Freq: Every day | ORAL | Status: DC
  Administered 2023-05-26 – 2023-05-30 (×5): 10 mg via ORAL
  Filled 2023-05-25 (×5): qty 1

## 2023-05-25 MED ORDER — SODIUM CHLORIDE 0.9 % IV SOLN: INTRAVENOUS | Status: DC | PRN

## 2023-05-25 MED ORDER — VITAMIN D 25 MCG (1000 UNIT) PO TABS
5000.0000 [IU] | ORAL_TABLET | Freq: Every day | ORAL | Status: DC
  Administered 2023-05-26 – 2023-05-30 (×5): 5000 [IU] via ORAL
  Filled 2023-05-25 (×5): qty 5

## 2023-05-25 MED ORDER — ALUM & MAG HYDROXIDE-SIMETH 200-200-20 MG/5ML PO SUSP
30.0000 mL | Freq: Four times a day (QID) | ORAL | Status: DC | PRN
  Filled 2023-05-25: qty 30

## 2023-05-25 MED ORDER — FENTANYL CITRATE PF 50 MCG/ML IJ SOSY
PREFILLED_SYRINGE | INTRAMUSCULAR | Status: AC
  Administered 2023-05-25: 12.5 ug via INTRAVENOUS
  Filled 2023-05-25: qty 1

## 2023-05-25 MED ORDER — MONTELUKAST SODIUM 10 MG PO TABS
10.0000 mg | ORAL_TABLET | Freq: Every day | ORAL | Status: DC
  Administered 2023-05-26 – 2023-05-29 (×4): 10 mg via ORAL
  Filled 2023-05-25 (×5): qty 1

## 2023-05-25 MED ORDER — DEXAMETHASONE SODIUM PHOSPHATE 10 MG/ML IJ SOLN
INTRAMUSCULAR | Status: AC
  Filled 2023-05-25: qty 1

## 2023-05-25 MED ORDER — IPRATROPIUM BROMIDE 0.06 % NA SOLN: 2.0000 | Freq: Two times a day (BID) | NASAL | Status: DC | PRN

## 2023-05-25 MED ORDER — ENSURE PRE-SURGERY PO LIQD
592.0000 mL | Freq: Once | ORAL | Status: DC
  Filled 2023-05-25: qty 592

## 2023-05-25 MED ORDER — BUPIVACAINE LIPOSOME 1.3 % IJ SUSP
INTRAMUSCULAR | Status: AC
  Filled 2023-05-25: qty 20

## 2023-05-25 MED ORDER — ONDANSETRON HCL 4 MG PO TABS: 4.0000 mg | ORAL_TABLET | Freq: Four times a day (QID) | ORAL | Status: DC | PRN

## 2023-05-25 MED ORDER — STERILE WATER FOR IRRIGATION IR SOLN
Status: DC | PRN
  Administered 2023-05-25: 1000 mL

## 2023-05-25 MED ORDER — KETAMINE HCL 10 MG/ML IJ SOLN
INTRAMUSCULAR | Status: DC | PRN
  Administered 2023-05-25: 30 mg via INTRAVENOUS
  Administered 2023-05-25: 20 mg via INTRAVENOUS

## 2023-05-25 MED ORDER — MIDAZOLAM HCL 2 MG/2ML IJ SOLN
INTRAMUSCULAR | Status: AC
  Filled 2023-05-25: qty 2

## 2023-05-25 MED ORDER — CHLORHEXIDINE GLUCONATE CLOTH 2 % EX PADS: 6.0000 | MEDICATED_PAD | Freq: Once | CUTANEOUS | Status: DC

## 2023-05-25 MED ORDER — DEXAMETHASONE SODIUM PHOSPHATE 10 MG/ML IJ SOLN
INTRAMUSCULAR | Status: DC | PRN
  Administered 2023-05-25: 8 mg via INTRAVENOUS

## 2023-05-25 MED ORDER — PROPOFOL 10 MG/ML IV BOLUS
INTRAVENOUS | Status: AC
  Filled 2023-05-25: qty 20

## 2023-05-25 MED ORDER — HYDROCHLOROTHIAZIDE 25 MG PO TABS
25.0000 mg | ORAL_TABLET | Freq: Every day | ORAL | Status: DC
  Administered 2023-05-26 – 2023-05-30 (×5): 25 mg via ORAL
  Filled 2023-05-25 (×5): qty 1

## 2023-05-25 MED ORDER — HEPARIN SODIUM (PORCINE) 5000 UNIT/ML IJ SOLN
5000.0000 [IU] | Freq: Three times a day (TID) | INTRAMUSCULAR | Status: DC
  Administered 2023-05-25 – 2023-05-30 (×14): 5000 [IU] via SUBCUTANEOUS
  Filled 2023-05-25 (×14): qty 1

## 2023-05-25 MED ORDER — FENTANYL CITRATE PF 50 MCG/ML IJ SOSY: 25.0000 ug | PREFILLED_SYRINGE | INTRAMUSCULAR | Status: DC | PRN

## 2023-05-25 MED ORDER — DIPHENHYDRAMINE HCL 12.5 MG/5ML PO ELIX: 12.5000 mg | ORAL_SOLUTION | Freq: Four times a day (QID) | ORAL | Status: DC | PRN

## 2023-05-25 MED ORDER — METRONIDAZOLE 500 MG PO TABS: 1000.0000 mg | ORAL_TABLET | ORAL | Status: DC

## 2023-05-25 MED ORDER — IBUPROFEN 400 MG PO TABS
600.0000 mg | ORAL_TABLET | Freq: Four times a day (QID) | ORAL | Status: DC | PRN
  Administered 2023-05-27 – 2023-05-30 (×3): 600 mg via ORAL
  Filled 2023-05-25 (×5): qty 1

## 2023-05-25 MED ORDER — 0.9 % SODIUM CHLORIDE (POUR BTL) OPTIME
TOPICAL | Status: DC | PRN
  Administered 2023-05-25: 2000 mL

## 2023-05-25 MED ORDER — ONDANSETRON HCL 4 MG/2ML IJ SOLN
INTRAMUSCULAR | Status: AC
  Filled 2023-05-25: qty 2

## 2023-05-25 MED ORDER — FENTANYL CITRATE (PF) 100 MCG/2ML IJ SOLN
INTRAMUSCULAR | Status: AC
  Filled 2023-05-25: qty 2

## 2023-05-25 MED ORDER — LIDOCAINE HCL (CARDIAC) PF 100 MG/5ML IV SOSY
PREFILLED_SYRINGE | INTRAVENOUS | Status: DC | PRN
  Administered 2023-05-25: 60 mg via INTRAVENOUS

## 2023-05-25 MED ORDER — ONDANSETRON HCL 4 MG/2ML IJ SOLN
4.0000 mg | Freq: Four times a day (QID) | INTRAMUSCULAR | Status: DC | PRN
  Administered 2023-05-25 – 2023-05-30 (×4): 4 mg via INTRAVENOUS
  Filled 2023-05-25 (×6): qty 2

## 2023-05-25 MED ORDER — MIDAZOLAM HCL 5 MG/5ML IJ SOLN
INTRAMUSCULAR | Status: DC | PRN
  Administered 2023-05-25: 2 mg via INTRAVENOUS

## 2023-05-25 MED ORDER — NEOMYCIN SULFATE 500 MG PO TABS: 1000.0000 mg | ORAL_TABLET | ORAL | Status: DC

## 2023-05-25 MED ORDER — SODIUM CHLORIDE 0.9 % IV SOLN
2.0000 g | INTRAVENOUS | Status: AC
  Administered 2023-05-25: 2 g via INTRAVENOUS
  Filled 2023-05-25: qty 2

## 2023-05-25 MED ORDER — BUPIVACAINE LIPOSOME 1.3 % IJ SUSP: 20.0000 mL | Freq: Once | INTRAMUSCULAR | Status: DC

## 2023-05-25 MED ORDER — LEVOTHYROXINE SODIUM 75 MCG PO TABS
75.0000 ug | ORAL_TABLET | Freq: Every day | ORAL | Status: DC
Start: 2023-05-26 — End: 2023-05-30
  Administered 2023-05-26 – 2023-05-30 (×5): 75 ug via ORAL
  Filled 2023-05-25 (×5): qty 1

## 2023-05-25 MED ORDER — HYDROMORPHONE HCL 2 MG/ML IJ SOLN
INTRAMUSCULAR | Status: AC
  Filled 2023-05-25: qty 1

## 2023-05-25 MED ORDER — ATORVASTATIN CALCIUM 20 MG PO TABS
20.0000 mg | ORAL_TABLET | Freq: Every day | ORAL | Status: DC
  Administered 2023-05-26 – 2023-05-29 (×4): 20 mg via ORAL
  Filled 2023-05-25 (×5): qty 1

## 2023-05-25 MED ORDER — LIDOCAINE HCL 1 % IJ SOLN
INTRAMUSCULAR | Status: AC
  Filled 2023-05-25: qty 20

## 2023-05-25 MED ORDER — ALBUMIN HUMAN 5 % IV SOLN
INTRAVENOUS | Status: DC | PRN
Start: 2023-05-25 — End: 2023-05-25

## 2023-05-25 MED ORDER — EPHEDRINE SULFATE-NACL 50-0.9 MG/10ML-% IV SOSY
PREFILLED_SYRINGE | INTRAVENOUS | Status: DC | PRN
Start: 2023-05-25 — End: 2023-05-25
  Administered 2023-05-25: 10 mg via INTRAVENOUS
  Administered 2023-05-25: 5 mg via INTRAVENOUS

## 2023-05-25 MED ORDER — FENTANYL CITRATE PF 50 MCG/ML IJ SOSY
PREFILLED_SYRINGE | INTRAMUSCULAR | Status: AC
  Administered 2023-05-25: 50 ug via INTRAVENOUS
  Filled 2023-05-25: qty 1

## 2023-05-25 MED ORDER — PHENYLEPHRINE HCL-NACL 20-0.9 MG/250ML-% IV SOLN
INTRAVENOUS | Status: DC | PRN
  Administered 2023-05-25: 40 ug/min via INTRAVENOUS

## 2023-05-25 MED ORDER — ALBUTEROL SULFATE (2.5 MG/3ML) 0.083% IN NEBU: 3.0000 mL | INHALATION_SOLUTION | Freq: Four times a day (QID) | RESPIRATORY_TRACT | Status: DC | PRN

## 2023-05-25 MED ORDER — KETAMINE HCL 50 MG/5ML IJ SOSY
PREFILLED_SYRINGE | INTRAMUSCULAR | Status: AC
  Filled 2023-05-25: qty 5

## 2023-05-25 MED ORDER — TRAMADOL HCL 50 MG PO TABS
50.0000 mg | ORAL_TABLET | Freq: Four times a day (QID) | ORAL | Status: DC | PRN
  Administered 2023-05-26 – 2023-05-28 (×5): 50 mg via ORAL
  Filled 2023-05-25 (×9): qty 1

## 2023-05-25 MED ORDER — LISINOPRIL-HYDROCHLOROTHIAZIDE 20-25 MG PO TABS: 1.0000 | ORAL_TABLET | Freq: Every day | ORAL | Status: DC

## 2023-05-25 MED ORDER — FENTANYL CITRATE (PF) 100 MCG/2ML IJ SOLN
INTRAMUSCULAR | Status: DC | PRN
  Administered 2023-05-25: 100 ug via INTRAVENOUS

## 2023-05-25 MED ORDER — STERILE WATER FOR INJECTION IJ SOLN
INTRAMUSCULAR | Status: AC
  Filled 2023-05-25: qty 10

## 2023-05-25 MED ORDER — LACTATED RINGERS IV SOLN: INTRAVENOUS | Status: DC

## 2023-05-25 MED ORDER — HYDROMORPHONE HCL 1 MG/ML IJ SOLN
INTRAMUSCULAR | Status: DC | PRN
  Administered 2023-05-25: .2 mg via INTRAVENOUS
  Administered 2023-05-25: .4 mg via INTRAVENOUS
  Administered 2023-05-25: .2 mg via INTRAVENOUS

## 2023-05-25 SURGICAL SUPPLY — 129 items
ADAPTER GOLDBERG URETERAL (ADAPTER) IMPLANT
APPLIER CLIP 5 13 M/L LIGAMAX5 (MISCELLANEOUS)
APPLIER CLIP ROT 10 11.4 M/L (STAPLE)
BAG COUNTER SPONGE SURGICOUNT (BAG) IMPLANT
BAG URO CATCHER STRL LF (MISCELLANEOUS) ×3 IMPLANT
BLADE EXTENDED COATED 6.5IN (ELECTRODE) ×3 IMPLANT
BNDG GAUZE DERMACEA FLUFF 4 (GAUZE/BANDAGES/DRESSINGS) IMPLANT
CANNULA REDUCER 12-8 DVNC XI (CANNULA) ×3 IMPLANT
CATH URETL OPEN 5X70 (CATHETERS) IMPLANT
CATH URETL OPEN END 6FR 70 (CATHETERS) IMPLANT
CELLS DAT CNTRL 66122 CELL SVR (MISCELLANEOUS) IMPLANT
CHLORAPREP W/TINT 26 (MISCELLANEOUS) ×3 IMPLANT
CLIP APPLIE 5 13 M/L LIGAMAX5 (MISCELLANEOUS) IMPLANT
CLIP APPLIE ROT 10 11.4 M/L (STAPLE) IMPLANT
CLIP LIGATING HEM O LOK PURPLE (MISCELLANEOUS) IMPLANT
CLIP LIGATING HEMO O LOK GREEN (MISCELLANEOUS) IMPLANT
CLOTH BEACON ORANGE TIMEOUT ST (SAFETY) ×3 IMPLANT
COVER SURGICAL LIGHT HANDLE (MISCELLANEOUS) ×6 IMPLANT
COVER TIP SHEARS 8 DVNC (MISCELLANEOUS) ×3 IMPLANT
DEFOGGER SCOPE WARMER CLEARIFY (MISCELLANEOUS) ×3 IMPLANT
DEVICE TROCAR PUNCTURE CLOSURE (ENDOMECHANICALS) IMPLANT
DRAIN CHANNEL 19F RND (DRAIN) ×3 IMPLANT
DRAPE ARM DVNC X/XI (DISPOSABLE) ×12 IMPLANT
DRAPE COLUMN DVNC XI (DISPOSABLE) ×3 IMPLANT
DRAPE SURG IRRIG POUCH 19X23 (DRAPES) ×3 IMPLANT
DRIVER NDL LRG 8 DVNC XI (INSTRUMENTS) ×3 IMPLANT
DRIVER NDLE LRG 8 DVNC XI (INSTRUMENTS) ×3 IMPLANT
DRSG OPSITE POSTOP 4X10 (GAUZE/BANDAGES/DRESSINGS) IMPLANT
DRSG OPSITE POSTOP 4X6 (GAUZE/BANDAGES/DRESSINGS) IMPLANT
DRSG OPSITE POSTOP 4X8 (GAUZE/BANDAGES/DRESSINGS) IMPLANT
DRSG TEGADERM 2-3/8X2-3/4 SM (GAUZE/BANDAGES/DRESSINGS) ×15 IMPLANT
DRSG TEGADERM 4X4.75 (GAUZE/BANDAGES/DRESSINGS) ×3 IMPLANT
ELECT REM PT RETURN 15FT ADLT (MISCELLANEOUS) ×3 IMPLANT
ENDOLOOP SUT PDS II 0 18 (SUTURE) IMPLANT
EVACUATOR SILICONE 100CC (DRAIN) ×3 IMPLANT
GAUZE PAD ABD 8X10 STRL (GAUZE/BANDAGES/DRESSINGS) IMPLANT
GAUZE SPONGE 2X2 8PLY STRL LF (GAUZE/BANDAGES/DRESSINGS) ×3 IMPLANT
GAUZE SPONGE 4X4 12PLY STRL (GAUZE/BANDAGES/DRESSINGS) IMPLANT
GLOVE BIO SURGEON STRL SZ7.5 (GLOVE) ×9 IMPLANT
GLOVE INDICATOR 8.0 STRL GRN (GLOVE) ×9 IMPLANT
GLOVE SURG LX STRL 7.5 STRW (GLOVE) ×3 IMPLANT
GOWN SRG XL LVL 4 BRTHBL STRL (GOWNS) ×3 IMPLANT
GOWN STRL REUS W/ TWL XL LVL3 (GOWN DISPOSABLE) ×15 IMPLANT
GRASPER SUT TROCAR 14GX15 (MISCELLANEOUS) IMPLANT
GRASPER TIP-UP FEN DVNC XI (INSTRUMENTS) ×3 IMPLANT
GUIDEWIRE ANG ZIPWIRE 038X150 (WIRE) IMPLANT
GUIDEWIRE STR DUAL SENSOR (WIRE) IMPLANT
HOLDER FOLEY CATH W/STRAP (MISCELLANEOUS) ×3 IMPLANT
IRRIG SUCT STRYKERFLOW 2 WTIP (MISCELLANEOUS) ×3
IRRIGATION SUCT STRKRFLW 2 WTP (MISCELLANEOUS) ×3 IMPLANT
KIT PROCEDURE DVNC SI (MISCELLANEOUS) ×3 IMPLANT
KIT TURNOVER KIT A (KITS) IMPLANT
LIGASURE IMPACT 36 18CM CVD LR (INSTRUMENTS) IMPLANT
LOOP VESSEL MAXI BLUE (MISCELLANEOUS) IMPLANT
MANIFOLD NEPTUNE II (INSTRUMENTS) ×3 IMPLANT
NDL INSUFFLATION 14GA 120MM (NEEDLE) ×3 IMPLANT
NEEDLE INSUFFLATION 14GA 120MM (NEEDLE) ×3 IMPLANT
PACK CARDIOVASCULAR III (CUSTOM PROCEDURE TRAY) ×3 IMPLANT
PACK COLON (CUSTOM PROCEDURE TRAY) ×3 IMPLANT
PACK CYSTO (CUSTOM PROCEDURE TRAY) ×3 IMPLANT
PAD POSITIONING PINK XL (MISCELLANEOUS) ×3 IMPLANT
PENCIL SMOKE EVACUATOR (MISCELLANEOUS) IMPLANT
PROTECTOR NERVE ULNAR (MISCELLANEOUS) ×6 IMPLANT
RELOAD PROXIMATE 75MM BLUE (ENDOMECHANICALS) ×6 IMPLANT
RELOAD STAPLE 45 3.5 BLU DVNC (STAPLE) IMPLANT
RELOAD STAPLE 45 4.3 GRN DVNC (STAPLE) IMPLANT
RELOAD STAPLE 60 3.5 BLU DVNC (STAPLE) IMPLANT
RELOAD STAPLE 60 4.3 GRN DVNC (STAPLE) IMPLANT
RELOAD STAPLE 75 3.8 BLU REG (ENDOMECHANICALS) IMPLANT
RELOAD STAPLER 3.5X45 BLU DVNC (STAPLE) IMPLANT
RELOAD STAPLER 3.5X60 BLU DVNC (STAPLE) IMPLANT
RELOAD STAPLER 4.3X45 GRN DVNC (STAPLE) IMPLANT
RELOAD STAPLER 4.3X60 GRN DVNC (STAPLE) IMPLANT
RETRACTOR WND ALEXIS 18 MED (MISCELLANEOUS) IMPLANT
RETRACTOR WOUND ALXS 34CM XLRG (MISCELLANEOUS) IMPLANT
RTRCTR WOUND ALEXIS 18CM MED (MISCELLANEOUS)
RTRCTR WOUND ALEXIS 34CM XLRG (MISCELLANEOUS) ×3
SCISSORS LAP 5X35 DISP (ENDOMECHANICALS) IMPLANT
SCISSORS MNPLR CVD DVNC XI (INSTRUMENTS) ×3 IMPLANT
SEAL UNIV 5-12 XI (MISCELLANEOUS) ×12 IMPLANT
SEALER VESSEL EXT DVNC XI (MISCELLANEOUS) ×3 IMPLANT
SET CYSTO W/LG BORE CLAMP LF (SET/KITS/TRAYS/PACK) IMPLANT
SLEEVE ADV FIXATION 5X100MM (TROCAR) IMPLANT
SOL ELECTROSURG ANTI STICK (MISCELLANEOUS) ×3
SOLUTION ELECTROSURG ANTI STCK (MISCELLANEOUS) ×3 IMPLANT
SPIKE FLUID TRANSFER (MISCELLANEOUS) ×3 IMPLANT
SPONGE T-LAP 18X18 ~~LOC~~+RFID (SPONGE) IMPLANT
STAPLER 60 SUREFORM DVNC (STAPLE) IMPLANT
STAPLER CIRCULAR MANUAL XL 25 (STAPLE) IMPLANT
STAPLER ECHELON POWER CIR 29 (STAPLE) IMPLANT
STAPLER ECHELON POWER CIR 31 (STAPLE) IMPLANT
STAPLER GUN LINEAR PROX 60 (STAPLE) IMPLANT
STAPLER PROXIMATE 75MM BLUE (STAPLE) IMPLANT
STAPLER RELOAD 3.5X45 BLU DVNC (STAPLE)
STAPLER RELOAD 3.5X60 BLU DVNC (STAPLE)
STAPLER RELOAD 4.3X45 GRN DVNC (STAPLE)
STAPLER RELOAD 4.3X60 GRN DVNC (STAPLE)
STAPLER SKIN PROX WIDE 3.9 (STAPLE) IMPLANT
STENT URET 6FRX24 CONTOUR (STENTS) IMPLANT
STOPCOCK 4 WAY LG BORE MALE ST (IV SETS) ×6 IMPLANT
SURGILUBE 2OZ TUBE FLIPTOP (MISCELLANEOUS) ×3 IMPLANT
SUT ETHILON 2 0 PS N (SUTURE) IMPLANT
SUT MNCRL AB 4-0 PS2 18 (SUTURE) ×3 IMPLANT
SUT PDS AB 1 CT1 27 (SUTURE) IMPLANT
SUT PDS AB 1 TP1 96 (SUTURE) IMPLANT
SUT PROLENE 0 CT 2 (SUTURE) IMPLANT
SUT PROLENE 2 0 KS (SUTURE) ×3 IMPLANT
SUT PROLENE 2 0 SH DA (SUTURE) IMPLANT
SUT SILK 2 0 SH CR/8 (SUTURE) IMPLANT
SUT SILK 2-0 18XBRD TIE 12 (SUTURE) IMPLANT
SUT SILK 3 0 SH CR/8 (SUTURE) ×3 IMPLANT
SUT SILK 3-0 18XBRD TIE 12 (SUTURE) ×3 IMPLANT
SUT V-LOC BARB 180 2/0GR6 GS22 (SUTURE)
SUT VIC AB 3-0 SH 18 (SUTURE) IMPLANT
SUT VIC AB 3-0 SH 27XBRD (SUTURE) IMPLANT
SUT VIC AB 4-0 RB1 27XBRD (SUTURE) IMPLANT
SUT VICRYL 0 UR6 27IN ABS (SUTURE) ×3 IMPLANT
SUTURE V-LC BRB 180 2/0GR6GS22 (SUTURE) IMPLANT
SYR 10ML LL (SYRINGE) ×3 IMPLANT
SYS LAPSCP GELPORT 120MM (MISCELLANEOUS)
SYS WOUND ALEXIS 18CM MED (MISCELLANEOUS) ×3
SYSTEM LAPSCP GELPORT 120MM (MISCELLANEOUS) IMPLANT
SYSTEM WOUND ALEXIS 18CM MED (MISCELLANEOUS) ×3 IMPLANT
TAPE UMBILICAL 1/8 X36 TWILL (MISCELLANEOUS) ×3 IMPLANT
TRAY FOLEY MTR SLVR 16FR STAT (SET/KITS/TRAYS/PACK) ×3 IMPLANT
TROCAR ADV FIXATION 5X100MM (TROCAR) ×3 IMPLANT
TUBING CONNECTING 10 (TUBING) ×12 IMPLANT
TUBING INSUFFLATION 10FT LAP (TUBING) ×3 IMPLANT
TUBING UROLOGY SET (TUBING) IMPLANT

## 2023-05-25 NOTE — Anesthesia Postprocedure Evaluation (Signed)
 Anesthesia Post Note  Patient: Darci Lykins  Procedure(s) Performed: LAPAROSCOPIC TO OPEN COLOSTOMY TAKEDOWN, BILATERAL TAP BLOCK, ASSESSMENT OF PERFUSION USING FIREFLY (Abdomen) LYSIS OF ADHESION, SMALL BOWEL RESECTION REPAIR OF SMALL BOWEL FLEXIBLE SIGMOIDOSCOPY CYSTOSCOPY with FIREFLY INJECTION CYSTOSCOPY WITH LEFT STENT PLACEMENT AND LEFT URETERAL REPAIIR (Bladder)     Patient location during evaluation: PACU Anesthesia Type: General Level of consciousness: awake and alert Pain management: pain level controlled Vital Signs Assessment: post-procedure vital signs reviewed and stable Respiratory status: spontaneous breathing, nonlabored ventilation, respiratory function stable and patient connected to nasal cannula oxygen Cardiovascular status: blood pressure returned to baseline and stable Postop Assessment: no apparent nausea or vomiting Anesthetic complications: no  No notable events documented.  Last Vitals:  Vitals:   05/25/23 1545 05/25/23 1635  BP: 105/73 104/84  Pulse: 100 92  Resp: 19 20  Temp: (!) 36.2 C 36.7 C  SpO2: 96% 96%    Last Pain:  Vitals:   05/25/23 1635  TempSrc: Oral  PainSc:                  Epifanio Lamar BRAVO

## 2023-05-25 NOTE — Op Note (Addendum)
 Operative Note  Preoperative diagnosis:  1.  Left ureteral injury  Postoperative diagnosis: 1.  Left ureteral injury  Procedure(s): 1.  Cystoscopy with retrograde placement of left 6 French by 24 cm JJ ureteral stent without tether 2.  Repair of partial ureteral transection  Surgeon: Valli Shank, MD  Assistants:  Rosalynn Likens, MD. Due to the complexity of the case an assistant was necessary to aid in stent placement from bladder and abdominal side.  Anesthesia:  General  Complications:  None  EBL:  minimal  Specimens: 1. none  Drains/Catheters: 1.  6Fr x 24cm JJ left ureteral stent - no tether 2.  16Fr foley catheter  Intraoperative findings:   Patrial transection of distal left ureter. Lumen visible but posterior wall intact. 2. No extravasation of urine seen after closure  Indication:  Whitney Higgins is a 68 y.o. female with a history of a colostomy in 2024 currently in the operating room for ex laparotomy and colostomy takedown.  Urology was consulted for left ureteral injury.  Description of procedure:  The patient was already under general anesthesia with an open abdomen when urology was called.  The left ureter was identified with a vessel loop and there was a partial ureteral transection of the left distal ureter.  The edges appeared clean and there was good mobilization of the ureter.  The patient's existing Foley catheter was removed.  Betadine  was used to prep the urethral meatus.  A 21 French rigid cystoscope was placed in the urethra and advanced into the bladder under direct visualization.  The left ureteral orifice was identified and cannulated with a 0.38 sensor wire.  The sensor wire was slowly advanced from below and was able to cross the transaction with gentle mobilization of the ureter from the open abdomen.  The wire was advanced until gentle resistance was met.  Next a 6 French by 24 cm JJ ureteral stent was placed over the wire and again under observation  traversed the ureteral transection.  A distal curl was seen under direct visualization after wire was removed with cystoscopy.  The cystoscope was removed and a 16 French Foley catheter was replaced.  Attention then turned to the open abdomen.  The vessel loop was removed and the ureteral transection was closed with 3 interrupted 4-0 Vicryl sutures.  There was no extravasation noted of urine after the closure of the ureter.  The peritoneum was then replaced over the ureter to add a layer over this area.  The case was then turned back over to general surgery.  Plan:   Keep foley for next 2 days and left ureteral stent for 6 weeks. Will plan on stent removal in the office in 6 weeks with renal US  following.

## 2023-05-25 NOTE — Op Note (Addendum)
 PATIENT: Whitney Higgins  68 y.o. female  Patient Care Team: Shlomo Darryle BROCKS, DO as PCP - General (Family Medicine)  PREOP DIAGNOSIS: COLOSTOMY STATUS  POSTOP DIAGNOSIS: COLOSTOMY STATUS  PROCEDURE:  Laparoscopic converted to open colostomy takedown Lysis of adhesions x 120 minutes Small bowel resection Small bowel repair, serosa Diagnostic flexible sigmoidoscopy (necessary to confirm anatomy) Intraoperative assessment of perfusion using ICG fluorescence imaging Bilateral transversus abdominus plane (TAP) blocks  (Cystoscopy/ureteral firefly/ICG - Dr. Nieves; left ureteral repair - Dr. Elisabeth)  SURGEON: Lonni HERO. Teresa, MD  ASSISTANT: Bernarda Ned MD, Krystal Spinner MD  An experienced assistant was required given the complexity of this procedure and the standard of surgical care. My assistant helped with exposure through counter tension, suctioning, ligation and retraction to better visualize the surgical field. My assistant expedited sewing during the case by following my sutures. Wherever I use the term we in the report, my assistant actively helped me with that portion of the procedure.   ANESTHESIA: General endotracheal  EBL: 250 mL Total I/O In: 4216.3 [I.V.:3366.3; IV Piggyback:850] Out: 545 [Urine:295; Blood:250]  DRAINS: 19 Fr round blake drain left draining pelvis and left lower quadrant  SPECIMEN:  Colostomy Small bowel segment  COUNTS: Sponge, needle and instrument counts were reported correct x2  FINDINGS: Dense intra-abdominal adhesions consisting of small bowel across much of the lower abdomen and midline.  The density of adhesions necessitates conversion to an open surgery.  Small bowel is densely adherent throughout the pelvis.  During a tedious adhesiolysis, the left ureter was pulled into one of the loops of small bowel and had a partial injury requiring repair by urology, Dr. Elisabeth.  Small bowel that was involved was resected and reanastomosed.   Additional serosal injury 1 another loop of small bowel was repaired.  A well perfused, tension free, hemostatic, air tight 25 mm EEA colorectal anastomosis fashioned 15 cm from the anal verge by flexible sigmoidoscopy.   NARRATIVE: Informed consent was verified. The patient was taken to the operating room, placed supine on the operating table and SCD's were applied. General endotracheal anesthesia was induced without difficulty.  The was then positioned in the lithotomy position with Allen stirrups.  Pressure points were evaluated and padded.  A foley catheter was then placed by nursing under sterile conditions. Hair on the abdomen was clipped.  The was secured to the operating table.  Dr. Nieves with [facility] scrubbed for his portion of the procedure.  Please refer to his notes for details regarding the ureteral firefly instillation.  The abdomen was then prepped and draped in the standard sterile fashion. Surgical timeout was called indicating the correct patient, procedure, positioning and need for preoperative antibiotics.   An OG tube was placed by anesthesia and confirmed to be to suction.  At Palmer's point, a stab incision was created and the Veress needle was introduced into the peritoneal cavity on the first attempt.  Intraperitoneal location was confirmed by the aspiration and saline drop test.  Pneumoperitoneum was established to a maximum pressure of 15 mmHg using CO2.  Following this, the abdomen was marked for planned trocar sites.  Just to the right and cephalad to the umbilicus, an 8 mm incision was created and an 8 mm blunt tipped robotic trocar was cautiously placed into the peritoneal cavity.  The laparoscope was inserted and demonstrated no evidence of trocar site nor Veress needle site complications.  The Veress needle was removed.  Adhesions across the abdomen are fairly dense.  We opted to proceed with placing an additional 8 mm trocar under direct visualization of the  right hemiabdomen.  After doing this, the adhesions are inspected.  There is barely no frank for any additional trocars we did not have enough working space to facilitate adhesiolysis even from a laparoscopic type approach.  Therefore, we opted to proceed with conversion to open surgery.  A midline laparotomy is fashion.  Subcutaneous tissues by electrocautery.  There are multiple Swiss cheese type defects involving the fascia that are about a centimeter wide consistent with incisional hernias from her prior surgery.  Abdominal entry was gained just cephalad to her old scar where there were any adhesions.  Fascia was incised.  Tedious adhesiolysis is then commenced taking down small bowel from the midline of the wound.  Small bowel is also mobilized and taken down laterally from both the left and right hemiabdomen's.  Associate omentum is lysed sharply.  After taking down adhesions from the abdominal wall, an Alexis wound protector was placed.  Small bowel to small bowel adhesions were then mobilized sharply using Metzenbaum scissors.  There was 1 small serosal injury about 5 mm in size that was repaired using 3-0 silk suture in a Lembert fashion.  Repair is inspected and complete.  The bowel is clearly patent.  A Balfour retractor was then placed with a bladder blade.  Adhesions of small bowel are identified in the deep pelvis.  They are quite densely adherent across much of her pelvis.  Tedious adhesiolysis is then carried out sharply using Metzenbaum scissors.  Ultimately, we are able to mobilize the vast majority of small bowel of her right hemipelvis and worked over towards the left side.  There is a loop of small bowel which is dense adherent to her left pelvic sidewall and cut edge of her prior sigmoid mesentery.  With just gentle traction on this loop of bowel, the serosa began to tear.  It was quite friable.  This was then carefully mobilized out of the pelvis and in doing so there was a partial  ureteral injury that occurred.  This was not across with a complete transection but did involve the anterior wall of the left ureter.  We were able to confirm this was the ureter by both physical identification and additionally using near-infrared light with the robotic camera.  Urology was notified and Dr. Elisabeth came to the room.  We were able to free this loop of bowel out of the pelvis without any further injury to the left ureter.  The segment of bowel is clearly nonviable as it has a full-thickness injury to it.  There was some spillage of stool/succus during this portion of the procedure.  We attempted to control this with 3-0 silk sutures to prevent any further spillage.  This loop of bowel was inspected.  Attention is directed at resection while Dr. Elisabeth was preparing for her portion of the procedure.  The left ureter was carefully encircled with a blue vessel loop for marking. Mesenteric windows were created on either side of affected segment of small bowel and subsequently each respective end is divided using a GIA blue load stapler.  Staple line is inspected and intact with well-formed staples and hemostatic.  The intervening segment of mesentery is ligated and divided using a LigaSure.  Attention is then directed at the small bowel anastomosis.  Windows are created in the antimesenteric border of each respective limb.  A 75 mm GIA blue load stapler was then used  to fashion the enteroenterostomy.  Staple line is inspected noted be both hemostatic with well-formed staples.  The common enterotomy is then occluded and closed using a TA 60 blue load stapler.  The anastomosis is palpated noted to be widely patent, 3 fingerbreadths in diameter.  A 3-0 silk stitch is placed at the apex of the anastomosis.  The mesenteric defect was obliterated using interrupted 3-0 silk suture.  The bowel was well-perfused in color at this location.  This was placed back into the abdomen.  Of note, total time with  adhesiolysis is approximately 120 minutes.  Dr. Elisabeth scrubbed for her portion of the procedure. We discussed nature of injury including that portion injured was with cautery.  Please refer to her notes for details.  Following the repair, attention was redirected at the colostomy takedown.  The transverse colon has been freed from the abdominal wall during adhesiolysis.  The associate omentum is also been free.  The skin around the colostomy is incised.  The subcutaneous tissue around the colostomy is divided electrocautery.  The colostomy able to be fully mobilized and able to be placed back into the abdominal cavity.  This is brought out through a midline laparotomy.  Just proximal to the actual mucocutaneous junction, the colostomy mesentery is cleared.  ICG perfusion is then utilized to assess perfusion of the colon.  There is avid uptake of the tracer all the way out to our planned point of division.  It is healthy in appearance.  There is no creeping fat anywhere or colon.  And of note, the small bowel is also normal in appearance of any creeping fat or physical evidence of Crohn's disease.  The rectosigmoid colon in the pelvis is identified.  It is healthy in appearance.  It was freed from its attachments around the uterus.  In doing so, there was more than enough reach for the planned transverse colon to reach down to the rectum without any tension.   I then went below to pass the flexible sigmoidoscope to ensure that the actual rectal stump is healthy in appearance without any evident injuries to the rectum.  The flexible sigmoidoscope was carefully advanced past the anal canal and into the rectum under direct visualization.  The rectum is healthy in appearance.  There is no evident injuries to the underlying mucosa.  The pelvis had also been filled with sterile irrigation and the rectum is noted to be airtight.  The staple line endoluminal is healthy in appearance.  This is at the level of the  proximal rectum just distal to the proximalmost fold of [place].  I scrubbed back in. A pursestring device was then placed over the distal end of the transverse colon just proximal to the mucocutaneous junction of the colostomy.  A 2-0 Prolene on a Keith needle was passed.  The colostomy was excised and passed off the specimen.  3-0 silk sutures were used to create belt loops around the pursestring.  EEA sizers were passed and a 29 mm EEA selected.  The anvil was placed in the pursestring tied.  A small amount of fat was cleared from the planned staple line.  Quality of the tissues is good.  This is placed back into the abdomen. The anvil reaches into the deep pelvis without any tension and remains in that location.  I then went below to pass the stapler.  Under direct visualization, EEA sizers were serially passed.  The 29 mm EEA stapler was passed.  Due to the  contour of her rectum and her proximalmost fold, we are not able to safely navigate this with our 20 mm stapler.  Therefore we opted to proceed with a 25 mm stapler which we knew would pass well based on the sizers that we had been passing.   I scrubbed back in.  We exchanged the 29 mm anvil for a 25 mm anvil.  I then went back below to pass the stapler under direct visualization. The spike is deployed just anterior to the prior Hartmann cuff staple line.  The components were then mated.  Orientation is confirmed such that there is no twisting of the colon or small bowel underneath the mesenteric defect.  The stapler was then closed, held, and fired.  Colon proximally anastomosis is gently occluded.  The pelvis was filled with irrigation.  I passed the flexible sigmoidoscope to perform a leak test.  The anastomosis is hemostatic in appearance and airtight.  All tissues are pink in color. This is located at 15 cm from the anal verge by flexible sigmoidoscopy. Additionally, looking from above, there is no tension on the colon or mesentery.  Sigmoidoscope  was withdrawn. I scrubbed back in. Irrigation was evacuated from the pelvis.  The abdomen and pelvis are surveyed and noted to be completely hemostatic.  A 19 French round Blake drain was then placed through the prior 8 mm trocar in the right lateral abdomen.  This was left draining both the ureteral repair and the colorectal anastomosis which are separated by peritoneum that Dr. Elisabeth had closed over the ureteral repair.  It is then secured using a 2-0 nylon suture.  The Alexis wound protector was removed.    Attention was then turned to closure.  All equipment was passed off.  A separate closing tray was utilized.  All scrub participants changed gowns/gloves.  Additional sterile drapes were then placed around the field.  We began by first closing the former colostomy site fascia.  The fascia is able to be brought together with a longitudinal type closure without any tension.  This was then closed using 2 running #1 PDS sutures.  The closure was inspected noted to be completely closed without any gaps.  The abdomen is reinspected.  Bowel underneath the colostomy closure site is free not incorporated.  Small bowel anastomosis rests in the right lower quadrant away from her colorectal anastomosis, ureteral pair, and her midline wound for that matter.  The colon rests without any tension on the anastomosis.  Hemostasis is observed.  Midline fascia was then closed using 2 running #1 looped PDS sutures.  The prior small incisional hernias were incorporated in this closure and were able to bring the fascia together nicely.  The fascial closure was palpated noted to be complete without any gaps.  Additional local anesthetic was infiltrated at the midline and former ostomy closure site.  All sponge, needle, and instrument counts were then reported correct.  A 2-0 Vicryl pursestring type suture was used to close/cinched down the skin at the former colostomy site.  The wound was then gently packed with a moist Kerlix.   Final counts are reported correct.  The skin of the midline incision as well as the port sites is then approximated using skin staples.  The abdomen is washed and dried.  Sterile dressings are applied.  She was then taken out of lithotomy, awakened from anesthesia, extubated, and transferred to a stretcher for transport to PACU in satisfactory condition having tolerated the procedure well.

## 2023-05-25 NOTE — Transfer of Care (Signed)
 Immediate Anesthesia Transfer of Care Note  Patient: Whitney Higgins  Procedure(s) Performed: LAPAROSCOPIC TO OPEN COLOSTOMY TAKEDOWN, BILATERAL TAP BLOCK, ASSESSMENT OF PERFUSION USING FIREFLY (Abdomen) LYSIS OF ADHESION, SMALL BOWEL RESECTION REPAIR OF SMALL BOWEL FLEXIBLE SIGMOIDOSCOPY CYSTOSCOPY with FIREFLY INJECTION CYSTOSCOPY WITH LEFT STENT PLACEMENT AND LEFT URETERAL REPAIIR (Bladder)  Patient Location: PACU  Anesthesia Type:General  Level of Consciousness: drowsy  Airway & Oxygen Therapy: Patient Spontanous Breathing  Post-op Assessment: Report given to RN  Post vital signs: Reviewed and stable  Last Vitals:  Vitals Value Taken Time  BP 129/70 05/25/23 1330  Temp 36.4 C 05/25/23 1328  Pulse 83 05/25/23 1332  Resp 20 05/25/23 1332  SpO2 97 % 05/25/23 1332  Vitals shown include unfiled device data.  Last Pain:  Vitals:   05/25/23 0619  TempSrc: Oral  PainSc:          Complications: No notable events documented.

## 2023-05-25 NOTE — Anesthesia Procedure Notes (Signed)
 Procedure Name: Intubation Date/Time: 05/25/2023 7:47 AM  Performed by: Delores Duwaine SAUNDERS, CRNAPre-anesthesia Checklist: Patient identified, Emergency Drugs available, Suction available and Patient being monitored Patient Re-evaluated:Patient Re-evaluated prior to induction Oxygen Delivery Method: Circle System Utilized Preoxygenation: Pre-oxygenation with 100% oxygen Induction Type: Rapid sequence and IV induction Laryngoscope Size: Mac and 3 Grade View: Grade I Tube type: Oral Tube size: 7.0 mm Number of attempts: 1 Airway Equipment and Method: Stylet Placement Confirmation: ETT inserted through vocal cords under direct vision, positive ETCO2 and breath sounds checked- equal and bilateral Secured at: 21 cm Tube secured with: Tape Dental Injury: Teeth and Oropharynx as per pre-operative assessment

## 2023-05-25 NOTE — H&P (Signed)
 CC: Here today for surgery  HPI: Whitney Higgins is an 68 y.o. female with history of suspected Crohn's, HTN, hypothyroidism, whom is seen in the office today for evaluation of colostomy status.   CT A/P 08/25/22 - There is again noted moderate to severe wall thickening involving the proximal sigmoid colon which may represent diverticulitis, although infectious or inflammatory colitis cannot be excluded. Stable findings seen in the adjacent peritoneal fat consistent with associated phlegmon or possibly early abscess. There is also now noted moderate wall thickening involving the descending colon also concerning for infectious or inflammatory colitis.  Small focal severe narrowing is seen involving the distal sigmoid colon with apple-core appearance; this may represent benign stricture, but neoplasm cannot be excluded, and sigmoidoscopy is recommended.  OR 08/29/22 with Dr. Curvin -exlap, extended left colectomy, mobilization of splenic flexure, transverse colostomy  PATH A. COLON, RECTOSIGMOID, RESECTION:  - Segment of colon with benign stricture  - Margins are viable  - Four benign lymph nodes   B- COLON, EXTENDED LEFT, RESECTION:  - Segment of colon with focal ulceration  - Margins are viable  - Four benign lymph nodes   C. COLON, DISTAL TRANSVERSE SEGMENT, RESECTION:  - Segment of colon with no specific pathologic changes  - Margins are viable   Colonoscopy 01/17/23, Dr. Avram - end colostomy healthy-appearing mucosa in the transverse colon. Entire colon is normal. Rectal stump inspected, some stool, look normal. Biopsied.  PATH - colonic mucosa without significant alteration. No evidence of IBD on random colon biopsies.  She was previously on Lialda  4 presumed Crohn disease but has been off all therapy now. Dr. Avram is not completely convinced at present that she may actually have Crohn's but is planning to follow her closely.  INTERVAL HX Gastrograffin enema  02/13/23: IMPRESSION: Unremarkable appearance of the rectal stump.  She has been doing well. She has had some skin breakdown just inferior to her os from cutting her appliance fairly wide. She is not following with the wound ostomy clinic but we will place a referral today. She denies any other complaints. She has been working to lose some weight and has been successful. She plans to lose additional weight between now and surgery. She remains highly motivated to undergo surgery. We have discussed all potential approaches again today including the potential need for a subtotal colectomy with ileorectal anastomosis. We did spend additional time reviewing what all that would involve including postoperative expectations and quality of life considerations.  PMH: suspected Crohn's, HTN, hypothyroidism,   PSH: exlap/Hartmann's end transverse colostomy.   FHx: Denies any known family history of colorectal, breast, endometrial or ovarian cancer  Social Hx: Denies use of tobacco/EtOH/illicit drug. She is here today with her husband. Her husband is in alumni of the graduate school at [employer].   She denies any changes in health or health history since we met in the office. No new medications/allergies. She states she is ready for surgery today.  Past Medical History:  Diagnosis Date   Abnormal alkaline phosphatase test 04/07/2019   Allergy    Anxiety    takes Xanax  daily   Arthritis    Asthma    inhaler prn and Singulair  daily;OTC Zyrtec daily   Cataract    Chronic back pain    stenosis   COVID-19 05/2019   Crohn's colitis (HCC) - left-sided 01/06/2019   Depression    takes Effexor  daily   Diverticulosis    Gallstones    H/O: pituitary tumor  History of blood transfusion    no abnormal reaction noted   History of bronchitis    last time less than a yr ago   History of colon polyps    Hx of colonic polyps 01/10/2019   Hyperlipidemia    takes Pravastatin daily   Hypertension    takes  Amlodipine  and Lisinopril  daily   Hypothyroidism    takes synthroid  and cytomel  daily   IBD (inflammatory bowel disease) UC vs Crohn's 01/06/2019   Insomnia    takes Xanax  nightly   Joint pain    Osteoporosis    Pneumonia    hx of;last time about 25yrs ago   PONV (postoperative nausea and vomiting)    Shortness of breath    with exertion   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   Weakness    left leg    Past Surgical History:  Procedure Laterality Date   APPENDECTOMY  1974   CESAREAN SECTION  1981   CHOLECYSTECTOMY     COLONOSCOPY     COLOSTOMY     due to blockage   DILATION AND CURETTAGE OF UTERUS  1979   epidural injections     EYE SURGERY     at age 57;vision correction   FLEXIBLE SIGMOIDOSCOPY N/A 08/27/2022   Procedure: FLEXIBLE SIGMOIDOSCOPY;  Surgeon: Charlanne Groom, MD;  Location: [facility];  Service: Gastroenterology;  Laterality: N/A;   JOINT REPLACEMENT  2010   right hip   LAPAROTOMY N/A 08/29/2022   Procedure: EXPLORATORY LAPAROTOMY, EXTENDED LEFT COLECTOMY WITH MOBILIZATION SPLENIC FLEXURE, TRANSVERSE COLOSTOMY;  Surgeon: Curvin Deward MOULD, MD;  Location: [facility];  Service: General;  Laterality: N/A;   LUMBAR LAMINECTOMY/DECOMPRESSION MICRODISCECTOMY Left 03/18/2013   Procedure: LEFT LUMBAR FOUR-FIVE,LUMBAR FIVE-SACRAL ONE MICRODISCECTOMY;  Surgeon: Darina MALVA Boehringer, MD;  Location: [facility];  Service: Neurosurgery;  Laterality: Left;  left   partial removal of right kidney  1971   pituitary tumor removed  1983   POLYPECTOMY     SHOULDER SURGERY Right     Family History  Problem Relation Age of Onset   Asthma Mother    Allergic rhinitis Mother    Allergic rhinitis Father    Allergic rhinitis Sister    Allergic rhinitis Brother    Colon cancer Neg Hx    Esophageal cancer Neg Hx    Liver cancer Neg Hx    Pancreatic cancer Neg Hx    Rectal cancer Neg Hx    Stomach cancer Neg Hx    Colon polyps Neg Hx     Social:  reports that she has never  smoked. She has never used smokeless tobacco. She reports current alcohol use. She reports that she does not use drugs.  Allergies:  Allergies  Allergen Reactions   Other Nausea And Vomiting and Other (See Comments)    PAIN MEDS AND A LOT OF ANTIBIOTICS CAN ONLY BE TOLERATED VIA IV- NOT BY MOUTH  ANESTHESIA- I GET DEATHLY ILL   Codeine Nausea And Vomiting   Clarithromycin Rash   Dicyclomine  Hcl     Dizzy, and headache   Penicillins Rash    Medications: I have reviewed the patient's current medications.  No results found for this or any previous visit (from the past 48 hours).  No results found.   PE Blood pressure 118/67, pulse 89, temperature 98.2 F (36.8 C), temperature source Oral, resp. rate 16, height 5' 5 (1.651 m), weight 112 kg, SpO2 93%. Constitutional: NAD; conversant Eyes: Moist conjunctiva; no  lid lag; anicteric Lungs: Normal respiratory effort CV: RRR GI: Abd soft, NT/ND ostomy productive Psychiatric: Appropriate affect  No results found for this or any previous visit (from the past 48 hours).  No results found.  A/P: Hayleigh Bawa is an 68 y.o. female with hx of suspected Crohn's, HTN, hypothyroidism, here for evaluation of colostomy status having had an extended left colectomy and end transverse colostomy 08/29/22.  Cscope clear 01/17/23  GGE 02/13/23 - Unremarkable appearance of the rectal stump  -WOCN Referral  -Remains highly motivated for quality-of-life stated reasons to undergo ostomy reversal  -The anatomy and physiology of the GI tract was reviewed with her as a pertains to her current anatomy. We have again reviewed that ostomy reversal is a elective procedure in the sense that she can live a normal life with a colostomy. We also spent time discussing the potential surgical approaches for reversal which could involve a transverse colon to rectal anastomosis versus a situation where a subtotal colectomy may be necessary with an ileorectal  anastomosis. -We have therefore discussed robotic assisted colostomy takedown, possible segmental colectomy, possible subtotal colectomy with ileorectal anastomosis based on intraoperative findings; flexible sigmoidoscopy; ureteral ICG with [facility]  -The planned procedure, material risks (including, but not limited to, pain, bleeding, infection, scarring, need for blood transfusion, damage to surrounding structures- blood vessels/nerves/viscus/organs, damage to ureter, urine leak, leak from anastomosis, need for additional procedures, low anterior resection syndrome (LARS) = increased fecal urgency and/or frequency, scenarios where a stoma may be necessary and where it may be permanent, general expectations with a ileorectal anastomosis including upwards of 5-6 bowel movements a day; worsening of pre-existing medical conditions, chronic diarrhea, constipation secondary to narcotic use, hernia, recurrence, pneumonia, heart attack, stroke, death) benefits and alternatives to surgery were discussed at length. The patient's questions were answered to her satisfaction, she voiced understanding and elected to proceed with surgery. Additionally, we discussed typical postoperative expectations and the recovery process.  Return for Postop appointment 2-3 weeks after surgery.  Lonni Pizza, MD [facility]

## 2023-05-25 NOTE — Op Note (Signed)
 Preoperative diagnosis colostomy status Postoperative diagnosis: Same  Procedure: Cystoscopy with retrograde injection of firefly fluorescent dye  Surgeon: Nieves  Anesthesia: General  Indication for procedure: Whitney Higgins is a 68 year old female who underwent colectomy with colostomy in 2024.  She presents today for exploratory laparotomy and colostomy takedown.  Cystoscopy with retrograde injection of firefly for ureteral identification was requested.  Findings: On exam there was moderate vaginal atrophy but no vulvar or introital lesions.  The meatus appeared normal.  The bladder and the urethra were palpably normal when confirming proper catheter location.  On cystoscopy the urethra and the bladder were unremarkable.  No stone or foreign body in the bladder.  No mucosal lesions.  The trigone and ureteral orifice ease were in their normal orthotopic position with clear E flux.  Description of procedure: After consent was obtained patient brought to the operating room.  After adequate anesthesia she was placed in lithotomy position and prepped and draped in the usual sterile fashion.  Timeout was performed to confirm the patient and procedure.  The cystoscope was passed per urethra and the bladder carefully inspected.  Open-ended catheter was inserted in the right ureteral orifice and retrograde injection of about 8 cc of firefly was performed.  Similarly the left ureteral orifice was cannulated with the open-ended catheter and left retrograde injection of firefly was performed.  The catheter was removed and the scope backed out.  A 16 French Foley catheter was placed.  The balloon was inflated and seated at the bladder neck.  Exam confirmed proper Foley placement.  She was turned over to the care of Dr. Teresa.  Complications: None  Blood loss: Minimal  Specimens: None  Drains: 16 French Foley catheter  Disposition: Patient turned over to Dr. Teresa for his procedure.

## 2023-05-25 NOTE — Consult Note (Signed)
 Consultation: Colostomy status history of colitis and colostomy Requested by: Dr. Lonni Pizza  History of Present Illness: Whitney Higgins is a 68 year old female with history of inflammatory colitis who underwent in April 2020 for ex lap with extended left colectomy and transverse colostomy with Dr. Curvin.  She presents today for ostomy reversal with Dr. Pizza.  Urology consulted for cystoscopy and retrograde injection of firefly.  Patient's been well without dysuria or gross hematuria.  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  She underwent a CT scan of the abdomen and pelvis April 2024 which showed a benign urinary tract.  January 2025 creatinine was 0.91.  Past Medical History:  Diagnosis Date   Abnormal alkaline phosphatase test 04/07/2019   Allergy    Anxiety    takes Xanax  daily   Arthritis    Asthma    inhaler prn and Singulair  daily;OTC Zyrtec daily   Cataract    Chronic back pain    stenosis   COVID-19 05/2019   Crohn's colitis (HCC) - left-sided 01/06/2019   Depression    takes Effexor  daily   Diverticulosis    Gallstones    H/O: pituitary tumor    History of blood transfusion    no abnormal reaction noted   History of bronchitis    last time less than a yr ago   History of colon polyps    Hx of colonic polyps 01/10/2019   Hyperlipidemia    takes Pravastatin daily   Hypertension    takes Amlodipine  and Lisinopril  daily   Hypothyroidism    takes synthroid  and cytomel  daily   IBD (inflammatory bowel disease) UC vs Crohn's 01/06/2019   Insomnia    takes Xanax  nightly   Joint pain    Osteoporosis    Pneumonia    hx of;last time about 80yrs ago   PONV (postoperative nausea and vomiting)    Shortness of breath    with exertion   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   Weakness    left leg   Past Surgical History:  Procedure Laterality Date   APPENDECTOMY  1974   CESAREAN SECTION  1981   CHOLECYSTECTOMY      COLONOSCOPY     COLOSTOMY     due to blockage   DILATION AND CURETTAGE OF UTERUS  1979   epidural injections     EYE SURGERY     at age 47;vision correction   FLEXIBLE SIGMOIDOSCOPY N/A 08/27/2022   Procedure: FLEXIBLE SIGMOIDOSCOPY;  Surgeon: Charlanne Groom, MD;  Location: [facility];  Service: Gastroenterology;  Laterality: N/A;   JOINT REPLACEMENT  2010   right hip   LAPAROTOMY N/A 08/29/2022   Procedure: EXPLORATORY LAPAROTOMY, EXTENDED LEFT COLECTOMY WITH MOBILIZATION SPLENIC FLEXURE, TRANSVERSE COLOSTOMY;  Surgeon: Curvin Deward MOULD, MD;  Location: [facility];  Service: General;  Laterality: N/A;   LUMBAR LAMINECTOMY/DECOMPRESSION MICRODISCECTOMY Left 03/18/2013   Procedure: LEFT LUMBAR FOUR-FIVE,LUMBAR FIVE-SACRAL ONE MICRODISCECTOMY;  Surgeon: Darina MALVA Boehringer, MD;  Location: [facility];  Service: Neurosurgery;  Laterality: Left;  left   partial removal of right kidney  1971   pituitary tumor removed  1983   POLYPECTOMY     SHOULDER SURGERY Right     Home Medications:  Medications Prior to Admission  Medication Sig Dispense Refill Last Dose/Taking   acetaminophen  (TYLENOL ) 500 MG tablet Take 1,000 mg by mouth every 6 (six) hours as needed for pain.   Past Week   amLODipine  (NORVASC )  5 MG tablet Take 5 mg by mouth daily.   05/25/2023 at  4:00 AM   atorvastatin  (LIPITOR) 20 MG tablet Take 20 mg by mouth at bedtime.   05/24/2023   Cholecalciferol  (VITAMIN D3) 125 MCG (5000 UT) TABS Take 5,000 Units by mouth daily.   Past Week   Cyanocobalamin  (B12 LIQUID HEALTH BOOSTER PO) Place 5,000 mcg under the tongue daily.   Past Week   fexofenadine (ALLEGRA) 180 MG tablet Take 180 mg by mouth daily.   05/25/2023 at  4:00 AM   ipratropium (ATROVENT ) 0.06 % nasal spray Place 2 sprays into the nose 2 (two) times daily as needed for rhinitis.   Past Week   levothyroxine  (SYNTHROID ) 75 MCG tablet Take 75 mcg by mouth daily before breakfast.   05/25/2023 at  4:00 AM   liothyronine  (CYTOMEL ) 5 MCG tablet  Take 5 mcg by mouth 2 (two) times daily.   05/25/2023 at  4:00 AM   lisinopril -hydrochlorothiazide  (PRINZIDE ,ZESTORETIC ) 20-25 MG per tablet Take 1 tablet by mouth daily.   05/24/2023   montelukast  (SINGULAIR ) 10 MG tablet Take 10 mg by mouth at bedtime.   05/24/2023   naproxen (NAPROSYN) 500 MG tablet Take 500 mg by mouth 2 (two) times daily as needed for moderate pain (pain score 4-6).   Past Week   potassium chloride  (KLOR-CON ) 10 MEQ tablet Take 10 mEq by mouth daily before breakfast.   Past Week   sodium chloride  (OCEAN) 0.65 % SOLN nasal spray Place 1 spray into both nostrils daily as needed for congestion.   Past Week   venlafaxine  XR (EFFEXOR -XR) 150 MG 24 hr capsule Take 150 mg by mouth 2 (two) times daily.   05/25/2023 at  4:00 AM   albuterol  (PROVENTIL  HFA;VENTOLIN  HFA) 108 (90 BASE) MCG/ACT inhaler Inhale 2 puffs into the lungs every 6 (six) hours as needed (Asthma).   Unknown   hyoscyamine  (LEVSIN  SL) 0.125 MG SL tablet Place 1 tablet (0.125 mg total) under the tongue every 4 (four) hours as needed. (Patient not taking: Reported on 10/31/2022) 30 tablet 0 Not Taking   methocarbamol  (ROBAXIN ) 500 MG tablet Take 1 tablet (500 mg total) by mouth every 6 (six) hours as needed for muscle spasms. (Patient not taking: Reported on 10/31/2022) 60 tablet 0 Not Taking   traMADol  (ULTRAM ) 50 MG tablet Take 1-2 tablets (50-100 mg total) by mouth every 6 (six) hours as needed for moderate pain or severe pain (50mg  mdoerate, 100mg  severe). (Patient not taking: Reported on 01/17/2023) 30 tablet 0 Not Taking   Allergies:  Allergies  Allergen Reactions   Other Nausea And Vomiting and Other (See Comments)    PAIN MEDS AND A LOT OF ANTIBIOTICS CAN ONLY BE TOLERATED VIA IV- NOT BY MOUTH  ANESTHESIA- I GET DEATHLY ILL   Codeine Nausea And Vomiting   Clarithromycin Rash   Dicyclomine  Hcl     Dizzy, and headache   Penicillins Rash    Family History  Problem Relation Age of Onset   Asthma Mother    Allergic  rhinitis Mother    Allergic rhinitis Father    Allergic rhinitis Sister    Allergic rhinitis Brother    Colon cancer Neg Hx    Esophageal cancer Neg Hx    Liver cancer Neg Hx    Pancreatic cancer Neg Hx    Rectal cancer Neg Hx    Stomach cancer Neg Hx    Colon polyps Neg Hx    Social History:  reports  that she has never smoked. She has never used smokeless tobacco. She reports current alcohol use. She reports that she does not use drugs.  ROS: A complete review of systems was performed.  All systems are negative except for pertinent findings as noted. Review of Systems  All other systems reviewed and are negative.    Physical Exam:  Vital signs in last 24 hours: Temp:  [98.2 F (36.8 C)] 98.2 F (36.8 C) (01/10 0619) Pulse Rate:  [89] 89 (01/10 0619) Resp:  [16] 16 (01/10 0619) BP: (118)/(67) 118/67 (01/10 0619) SpO2:  [93 %] 93 % (01/10 0619) Weight:  [887 kg] 112 kg (01/10 0536) General:  Alert and oriented, No acute distress HEENT: Normocephalic, atraumatic Cardiovascular: Regular rate and rhythm Lungs: Regular rate and effort Abdomen: Soft, nontender, nondistended, no abdominal masses Back: No CVA tenderness Extremities: No edema Neurologic: Grossly intact  Laboratory Data:  No results found for this or any previous visit (from the past 24 hours). No results found for this or any previous visit (from the past 240 hours). Creatinine: Recent Labs    05/18/23 1117  CREATININE 0.91    Impression/Assessment:  68 year old history of colectomy for ostomy reversal-  Plan:  I discussed with the patient and her family the nature, potential benefits, risks and alternatives to cystoscopy with retrograde injection of firefly, including side effects of the proposed treatment, the likelihood of the patient achieving the goals of the procedure, and any potential problems that might occur during the procedure or recuperation. All questions answered. Patient elects to proceed.     Whitney Higgins 05/25/2023

## 2023-05-26 ENCOUNTER — Encounter (HOSPITAL_COMMUNITY): Payer: Self-pay | Admitting: Surgery

## 2023-05-26 LAB — BASIC METABOLIC PANEL
Anion gap: 8 (ref 5–15)
BUN: 21 mg/dL (ref 8–23)
CO2: 25 mmol/L (ref 22–32)
Calcium: 8.4 mg/dL — ABNORMAL LOW (ref 8.9–10.3)
Chloride: 100 mmol/L (ref 98–111)
Creatinine, Ser: 1.13 mg/dL — ABNORMAL HIGH (ref 0.44–1.00)
GFR, Estimated: 53 mL/min — ABNORMAL LOW (ref >60)
Glucose, Bld: 113 mg/dL — ABNORMAL HIGH (ref 70–99)
Potassium: 4.3 mmol/L (ref 3.5–5.1)
Sodium: 133 mmol/L — ABNORMAL LOW (ref 135–145)

## 2023-05-26 LAB — CBC
HCT: 33.1 % — ABNORMAL LOW (ref 36.0–46.0)
Hemoglobin: 11.1 g/dL — ABNORMAL LOW (ref 12.0–15.0)
MCH: 29.5 pg (ref 26.0–34.0)
MCHC: 33.5 g/dL (ref 30.0–36.0)
MCV: 88 fL (ref 80.0–100.0)
Platelets: 158 10*3/uL (ref 150–400)
RBC: 3.76 MIL/uL — ABNORMAL LOW (ref 3.87–5.11)
RDW: 14 % (ref 11.5–15.5)
WBC: 16.1 10*3/uL — ABNORMAL HIGH (ref 4.0–10.5)
nRBC: 0 % (ref 0.0–0.2)

## 2023-05-26 MED ORDER — CHLORHEXIDINE GLUCONATE CLOTH 2 % EX PADS
6.0000 | MEDICATED_PAD | Freq: Every day | CUTANEOUS | Status: DC
  Administered 2023-05-26 – 2023-05-30 (×5): 6 via TOPICAL

## 2023-05-26 NOTE — Plan of Care (Signed)

## 2023-05-26 NOTE — Progress Notes (Addendum)
 1 Day Post-Op   Subjective/Chief Complaint: Doing well, no complaints, discussed surgery with her this am   Objective: Vital signs in last 24 hours: Temp:  [97.2 F (36.2 C)-98.7 F (37.1 C)] 98.1 F (36.7 C) (01/11 0501) Pulse Rate:  [80-100] 97 (01/11 0501) Resp:  [15-40] 16 (01/11 0501) BP: (104-148)/(69-111) 127/76 (01/11 0501) SpO2:  [92 %-97 %] 95 % (01/11 0501) Weight:  [115.2 kg] 115.2 kg (01/11 0500) Last BM Date : 05/25/23  Intake/Output from previous day: 01/10 0701 - 01/11 0700 In: 5644.9 [P.O.:620; I.V.:4174.9; IV Piggyback:850] Out: 1875 [Urine:1370; Drains:255; Blood:250] Intake/Output this shift: No intake/output data recorded.  General nad Cv regular Pulm effort normal Ab soft approp tender drain serosang all dressings dry  Lab Results:  No results for input(s): WBC, HGB, HCT, PLT in the last 72 hours. BMET No results for input(s): NA, K, CL, CO2, GLUCOSE, BUN, CREATININE, CALCIUM  in the last 72 hours. PT/INR No results for input(s): LABPROT, INR in the last 72 hours. ABG No results for input(s): PHART, HCO3 in the last 72 hours.  Invalid input(s): PCO2, PO2  Studies/Results: No results found.  Anti-infectives: Anti-infectives (From admission, onward)    Start     Dose/Rate Route Frequency Ordered Stop   05/25/23 1400  neomycin  (MYCIFRADIN ) tablet 1,000 mg  Status:  Discontinued       Placed in And Linked Group   1,000 mg Oral 3 times per day 05/25/23 0528 05/25/23 0529   05/25/23 1400  metroNIDAZOLE  (FLAGYL ) tablet 1,000 mg  Status:  Discontinued       Placed in And Linked Group   1,000 mg Oral 3 times per day 05/25/23 0528 05/25/23 0529   05/25/23 0600  cefoTEtan  (CEFOTAN ) 2 g in sodium chloride  0.9 % 100 mL IVPB        2 g 200 mL/hr over 30 Minutes Intravenous On call to O.R. 05/25/23 0528 05/25/23 0803       Assessment/Plan: POD 1 open colostomy takedown, sbr, left ureteral repair over  stent -clears, written for advance to fulls as tolerated -will keep on iv fluids today as not taking much po yet -pain control -oob, pulm toilet -foley to remain -sq heparin  -labs pending will follow up    Whitney Higgins 05/26/2023

## 2023-05-26 NOTE — Progress Notes (Signed)
 Urology Inpatient Progress Report    Intv/Subj: No acute events overnight. Patient is without complaint.  Principal Problem:   S/P colostomy takedown  Current Facility-Administered Medications  Medication Dose Route Frequency Provider Last Rate Last Admin   acetaminophen  (TYLENOL ) tablet 1,000 mg  1,000 mg Oral Q6H Teresa Lonni HERO, MD   1,000 mg at 05/26/23 9461   albuterol  (PROVENTIL ) (2.5 MG/3ML) 0.083% nebulizer solution 3 mL  3 mL Nebulization Q6H PRN Teresa Lonni HERO, MD       alum & mag hydroxide-simeth (MAALOX/MYLANTA) 200-200-20 MG/5ML suspension 30 mL  30 mL Oral Q6H PRN Teresa Lonni HERO, MD       amLODipine  (NORVASC ) tablet 5 mg  5 mg Oral Daily Teresa Lonni HERO, MD       atorvastatin  (LIPITOR) tablet 20 mg  20 mg Oral QHS White, Lonni HERO, MD       Chlorhexidine  Gluconate Cloth 2 % PADS 6 each  6 each Topical Daily Teresa Lonni HERO, MD       cholecalciferol  (VITAMIN D3) 25 MCG (1000 UNIT) tablet 5,000 Units  5,000 Units Oral Daily Teresa Lonni HERO, MD       dextrose  5 % and 0.45 % NaCl with KCl 20 mEq/L infusion   Intravenous Continuous Ebbie Cough, MD 75 mL/hr at 05/26/23 0804 Rate Change at 05/26/23 0804   diphenhydrAMINE  (BENADRYL ) 12.5 MG/5ML elixir 12.5 mg  12.5 mg Oral Q6H PRN Teresa Lonni HERO, MD       Or   diphenhydrAMINE  (BENADRYL ) injection 12.5 mg  12.5 mg Intravenous Q6H PRN Teresa Lonni HERO, MD       feeding supplement (ENSURE SURGERY) liquid 237 mL  237 mL Oral BID BM Teresa Lonni HERO, MD       heparin  injection 5,000 Units  5,000 Units Subcutaneous Q8H Teresa Lonni HERO, MD   5,000 Units at 05/26/23 9461   hydrALAZINE  (APRESOLINE ) injection 10 mg  10 mg Intravenous Q2H PRN Teresa Lonni HERO, MD       lisinopril  (ZESTRIL ) tablet 20 mg  20 mg Oral Daily Mark Bard LABOR, RPH       And   hydrochlorothiazide  (HYDRODIURIL ) tablet 25 mg  25 mg Oral Daily Wofford, Bard LABOR, RPH       HYDROmorphone  (DILAUDID ) injection  0.5 mg  0.5 mg Intravenous Q2H PRN Teresa Lonni HERO, MD   0.5 mg at 05/26/23 9349   ibuprofen  (ADVIL ) tablet 600 mg  600 mg Oral Q6H PRN Teresa Lonni HERO, MD       ipratropium (ATROVENT ) 0.06 % nasal spray 2 spray  2 spray Nasal BID PRN Teresa Lonni HERO, MD       levothyroxine  (SYNTHROID ) tablet 75 mcg  75 mcg Oral QAC breakfast Teresa Lonni HERO, MD   75 mcg at 05/26/23 9461   liothyronine  (CYTOMEL ) tablet 5 mcg  5 mcg Oral BID Teresa Lonni HERO, MD       loratadine  (CLARITIN ) tablet 10 mg  10 mg Oral Daily Teresa Lonni HERO, MD       montelukast  (SINGULAIR ) tablet 10 mg  10 mg Oral QHS Teresa Lonni HERO, MD       ondansetron  (ZOFRAN ) tablet 4 mg  4 mg Oral Q6H PRN Teresa Lonni HERO, MD       Or   ondansetron  (ZOFRAN ) injection 4 mg  4 mg Intravenous Q6H PRN Teresa Lonni HERO, MD   4 mg at 05/26/23 0650   simethicone  (MYLICON) chewable tablet 40 mg  40 mg  Oral Q6H PRN Teresa Lonni HERO, MD       traMADol  (ULTRAM ) tablet 50 mg  50 mg Oral Q6H PRN Teresa Lonni HERO, MD       venlafaxine  XR (EFFEXOR -XR) 24 hr capsule 150 mg  150 mg Oral BID Teresa Lonni HERO, MD         Objective: Vital: Vitals:   05/25/23 2253 05/26/23 0200 05/26/23 0500 05/26/23 0501  BP: (!) 148/79 117/77  127/76  Pulse: 99 97  97  Resp: 15 16  16   Temp: (!) 97.5 F (36.4 C) 98.3 F (36.8 C)  98.1 F (36.7 C)  TempSrc: Oral Oral  Oral  SpO2: 92% 92%  95%  Weight:   115.2 kg   Height:       I/Os: I/O last 3 completed shifts: In: 5644.9 [P.O.:620; I.V.:4174.9; IV Piggyback:850] Out: 1875 [Urine:1370; Drains:255; Blood:250]  Physical Exam:  General: Patient is in no apparent distress Lungs: Normal respiratory effort, chest expands symmetrically. GI: Incisions are c/d/i.  The abdomen is soft and appropriately tender  Foley: draining concentrated urine  Ext: lower extremities symmetric  Lab Results: Recent Labs    05/26/23 0653  WBC 16.1*  HGB 11.1*  HCT 33.1*    Recent Labs    05/26/23 0653  NA 133*  K 4.3  CL 100  CO2 25  GLUCOSE 113*  BUN 21  CREATININE 1.13*  CALCIUM  8.4*   No results for input(s): LABPT, INR in the last 72 hours. No results for input(s): LABURIN in the last 72 hours. Results for orders placed or performed during the hospital encounter of 08/17/22  Calprotectin, Fecal     Status: Abnormal   Collection Time: 08/18/22  1:03 PM   Specimen: Stool  Result Value Ref Range Status   Calprotectin, Fecal >8,000 (H) 0 - 120 ug/g Final    Comment: (NOTE) **Results verified by repeat testing** Concentration     Interpretation   Follow-Up < 5 - 50 ug/g     Normal           None >50 -120 ug/g     Borderline       Re-evaluate in 4-6 weeks    >120 ug/g     Abnormal         Repeat as clinically                                   indicated Performed At: [facility] [address] [place], [place] [id number] Jennette Shorter MD Ey:[id number]   Calprotectin, Fecal     Status: Abnormal   Collection Time: 08/27/22  8:29 AM   Specimen: Stool  Result Value Ref Range Status   Calprotectin, Fecal 459 (H) 0 - 120 ug/g Final    Comment: (NOTE) Concentration     Interpretation   Follow-Up < 5 - 50 ug/g     Normal           None >50 -120 ug/g     Borderline       Re-evaluate in 4-6 weeks    >120 ug/g     Abnormal         Repeat as clinically                                   indicated Performed At: [facility] [address] [place], [place] [id number] Jennette Shorter MD Ey:[id number]   Surgical PCR screen     Status: Abnormal   Collection Time: 08/29/22  2:22 AM   Specimen: Nasal Mucosa; Nasal Swab  Result Value Ref Range Status   MRSA, PCR NEGATIVE NEGATIVE Final   Staphylococcus aureus POSITIVE (A) NEGATIVE Final    Comment: (NOTE) The Xpert SA Assay (FDA approved for NASAL specimens in patients 2 years of age and older), is one component of a comprehensive surveillance program. It is not intended to  diagnose infection nor to guide or monitor treatment. Performed at [facility], [address], [place], [place] [address]     Studies/Results: No results found.  Assessment: Left ureteral injury POD1 s/p left ureteral stent snd open ureteral repair  Plan: -care per gen surg -Continue foley through the weekend -Left ureteral stent to stay in place for approximately 6 weeks -Will arrange outpatient urology follow up for stent removal -can use oxybutynin 5mg  TID PRN for stent discomfort/urgency/frequency    Valli Shank, MD Urology 05/26/2023, 9:56 AM

## 2023-05-27 LAB — BASIC METABOLIC PANEL
Anion gap: 5 (ref 5–15)
BUN: 14 mg/dL (ref 8–23)
CO2: 29 mmol/L (ref 22–32)
Calcium: 8.5 mg/dL — ABNORMAL LOW (ref 8.9–10.3)
Chloride: 99 mmol/L (ref 98–111)
Creatinine, Ser: 0.72 mg/dL (ref 0.44–1.00)
GFR, Estimated: >60 mL/min (ref >60)
Glucose, Bld: 107 mg/dL — ABNORMAL HIGH (ref 70–99)
Potassium: 3.8 mmol/L (ref 3.5–5.1)
Sodium: 133 mmol/L — ABNORMAL LOW (ref 135–145)

## 2023-05-27 LAB — CBC
HCT: 31.2 % — ABNORMAL LOW (ref 36.0–46.0)
Hemoglobin: 10.2 g/dL — ABNORMAL LOW (ref 12.0–15.0)
MCH: 29 pg (ref 26.0–34.0)
MCHC: 32.7 g/dL (ref 30.0–36.0)
MCV: 88.6 fL (ref 80.0–100.0)
Platelets: 156 10*3/uL (ref 150–400)
RBC: 3.52 MIL/uL — ABNORMAL LOW (ref 3.87–5.11)
RDW: 14.2 % (ref 11.5–15.5)
WBC: 13.8 10*3/uL — ABNORMAL HIGH (ref 4.0–10.5)
nRBC: 0 % (ref 0.0–0.2)

## 2023-05-27 MED ORDER — KCL IN DEXTROSE-NACL 20-5-0.45 MEQ/L-%-% IV SOLN
INTRAVENOUS | Status: AC
  Filled 2023-05-27 (×3): qty 1000

## 2023-05-27 MED ORDER — HYDROMORPHONE HCL 1 MG/ML IJ SOLN: 0.5000 mg | INTRAMUSCULAR | Status: DC | PRN

## 2023-05-27 NOTE — Progress Notes (Signed)
 Dr Cliffton Asters rounded on pt. Pt up in chair and needing O2 at 4L to keep sats up in 90's. O2 saturation 94%. She was very groggy and a bit confused. Husband at bedside.Md ordered IVF's on pt.

## 2023-05-27 NOTE — Plan of Care (Signed)

## 2023-05-27 NOTE — Progress Notes (Signed)
 Able to wean pt to 2L O2. More alert and ambulated.

## 2023-05-27 NOTE — Plan of Care (Signed)
  Problem: Respiratory: Goal: Respiratory status will improve Outcome: Progressing   Problem: Safety: Goal: Ability to remain free from injury will improve Outcome: Progressing

## 2023-05-27 NOTE — Progress Notes (Addendum)
 Seen as well. Re-reviewed procedure, findings and plans moving forward. No n/v. Tol some liquids without trouble.  NAD, comfortable RRR Abd soft, appropriate incisional tenderness, sites clean  Agree with plan as noted by my partner Restart IVF as these appear to have expired, not taking as much by mouth yet  Lonni Pizza, MD [facility]

## 2023-05-27 NOTE — Progress Notes (Signed)
 2 Days Post-Op   Subjective/Chief Complaint: Sleepy this am, somewhat disoriented but able to be reoriented quickly, no flatus, no n/v, sat in chair yesterday   Objective: Vital signs in last 24 hours: Temp:  [98 F (36.7 C)-98.9 F (37.2 C)] 98.9 F (37.2 C) (01/12 0515) Pulse Rate:  [85-97] 85 (01/11 2222) Resp:  [15-18] 17 (01/12 0515) BP: (104-134)/(59-94) 104/94 (01/12 0515) SpO2:  [89 %-97 %] 95 % (01/12 0515) Weight:  [117.7 kg] 117.7 kg (01/12 0500) Last BM Date : 05/25/23  Intake/Output from previous day: 01/11 0701 - 01/12 0700 In: 2193.2 [P.O.:1340; I.V.:853.2] Out: 3245 [Urine:3100; Drains:145] Intake/Output this shift: No intake/output data recorded.  General nad Cv regular Pulm effort normal Ab soft approp tender drain serosang all dressings dry, ostomy packing removed  Lab Results:  Recent Labs    05/26/23 0653 05/27/23 0521  WBC 16.1* 13.8*  HGB 11.1* 10.2*  HCT 33.1* 31.2*  PLT 158 156   BMET Recent Labs    05/26/23 0653 05/27/23 0521  NA 133* 133*  K 4.3 3.8  CL 100 99  CO2 25 29  GLUCOSE 113* 107*  BUN 21 14  CREATININE 1.13* 0.72  CALCIUM  8.4* 8.5*   PT/INR No results for input(s): LABPROT, INR in the last 72 hours. ABG No results for input(s): PHART, HCO3 in the last 72 hours.  Invalid input(s): PCO2, PO2  Studies/Results: No results found.  Anti-infectives: Anti-infectives (From admission, onward)    Start     Dose/Rate Route Frequency Ordered Stop   05/25/23 1400  neomycin  (MYCIFRADIN ) tablet 1,000 mg  Status:  Discontinued       Placed in And Linked Group   1,000 mg Oral 3 times per day 05/25/23 0528 05/25/23 0529   05/25/23 1400  metroNIDAZOLE  (FLAGYL ) tablet 1,000 mg  Status:  Discontinued       Placed in And Linked Group   1,000 mg Oral 3 times per day 05/25/23 0528 05/25/23 0529   05/25/23 0600  cefoTEtan  (CEFOTAN ) 2 g in sodium chloride  0.9 % 100 mL IVPB        2 g 200 mL/hr over 30 Minutes  Intravenous On call to O.R. 05/25/23 0528 05/25/23 0803       Assessment/Plan: POD 2 open colostomy takedown, sbr, left ureteral repair over stent -on fulls per protocol, will leave there until bowel function -will keep on iv fluids today as still not taking much po yet -pain control -oob, pulm toilet -foley to remain -sq heparin  -lCr normal today, labs otherwise as expected    Donnice Bury 05/27/2023

## 2023-05-28 LAB — CBC
HCT: 28.1 % — ABNORMAL LOW (ref 36.0–46.0)
Hemoglobin: 9.2 g/dL — ABNORMAL LOW (ref 12.0–15.0)
MCH: 29 pg (ref 26.0–34.0)
MCHC: 32.7 g/dL (ref 30.0–36.0)
MCV: 88.6 fL (ref 80.0–100.0)
Platelets: 141 10*3/uL — ABNORMAL LOW (ref 150–400)
RBC: 3.17 MIL/uL — ABNORMAL LOW (ref 3.87–5.11)
RDW: 13.9 % (ref 11.5–15.5)
WBC: 9.2 10*3/uL (ref 4.0–10.5)
nRBC: 0 % (ref 0.0–0.2)

## 2023-05-28 LAB — BASIC METABOLIC PANEL
Anion gap: 6 (ref 5–15)
BUN: 16 mg/dL (ref 8–23)
CO2: 30 mmol/L (ref 22–32)
Calcium: 8.4 mg/dL — ABNORMAL LOW (ref 8.9–10.3)
Chloride: 96 mmol/L — ABNORMAL LOW (ref 98–111)
Creatinine, Ser: 0.98 mg/dL (ref 0.44–1.00)
GFR, Estimated: >60 mL/min (ref >60)
Glucose, Bld: 93 mg/dL (ref 70–99)
Potassium: 3.6 mmol/L (ref 3.5–5.1)
Sodium: 132 mmol/L — ABNORMAL LOW (ref 135–145)

## 2023-05-28 LAB — SURGICAL PATHOLOGY

## 2023-05-28 MED ORDER — POTASSIUM CHLORIDE CRYS ER 20 MEQ PO TBCR
20.0000 meq | EXTENDED_RELEASE_TABLET | Freq: Two times a day (BID) | ORAL | Status: DC
  Administered 2023-05-28 – 2023-05-30 (×5): 20 meq via ORAL
  Filled 2023-05-28 (×5): qty 1

## 2023-05-28 NOTE — Progress Notes (Signed)
   3 Days Post-Op Subjective: First time meeting Whitney Higgins. Feeling better. NAEON.   Objective: Vital signs in last 24 hours: Temp:  [98.2 F (36.8 C)-99 F (37.2 C)] 98.3 F (36.8 C) (01/12 2109) Pulse Rate:  [75-82] 75 (01/12 2109) Resp:  [16-18] 16 (01/12 2109) BP: (115-142)/(59-78) 142/62 (01/12 2109) SpO2:  [91 %-95 %] 95 % (01/12 2109) Weight:  [118.1 kg] 118.1 kg (01/13 0701)  Assessment/Plan: #ureteral repair S/p left ureteral stent with open ureteral repair Stent to stay in place for 6 weeks Foley catheter out today Dr. Elisabeth arranging outpt follow up for stent removal Oxybutynin 5mg  PO TID PRN for stent colic, urinary urgency, urinary frequency, bladder spasm.  Will see as needed.   Intake/Output from previous day: 01/12 0701 - 01/13 0700 In: 1820 [P.O.:820; I.V.:1000] Out: 1300 [Urine:1200; Drains:100]  Intake/Output this shift: No intake/output data recorded.  Physical Exam:  General: Alert and oriented CV: No cyanosis Lungs: equal chest rise Abdomen: Soft, NTND, no rebound or guarding Gu: foley catheter in place draining clear yellow urine  Lab Results: Recent Labs    05/26/23 0653 05/27/23 0521 05/28/23 0434  HGB 11.1* 10.2* 9.2*  HCT 33.1* 31.2* 28.1*   BMET Recent Labs    05/27/23 0521 05/28/23 0434  NA 133* 132*  K 3.8 3.6  CL 99 96*  CO2 29 30  GLUCOSE 107* 93  BUN 14 16  CREATININE 0.72 0.98  CALCIUM  8.5* 8.4*     Studies/Results: No results found.    LOS: 3 days   Ole Bourdon, NP [facility] Pager: [phone]  05/28/2023, 9:31 AM

## 2023-05-28 NOTE — Progress Notes (Signed)
   05/28/23 0946  TOC Brief Assessment  Insurance and Status Reviewed  Patient has primary care physician Yes  Home environment has been reviewed home with spouse  Prior level of function: independent  Prior/Current Home Services No current home services  Social Drivers of Health Review SDOH reviewed no interventions necessary  Readmission risk has been reviewed Yes  Transition of care needs no transition of care needs at this time

## 2023-05-28 NOTE — Evaluation (Signed)
 Occupational Therapy Evaluation Patient Details Name: Whitney Higgins MRN: [id number] DOB: May 31, 1955 Today's Date: 05/28/2023   History of Present Illness Pt is a 68 y.o. female s/p colostomy takedown on 05/25/2023. PMH significant for anxiety, depression, OA, asthma, chronic LBP s/p lumbar laminectomy/decompression, gallstones, pituitary tumor, HTN, HDL, diverticulitis, Chohns colitis and IBS.   Clinical Impression   Prior to hospital admission, pt was mod independent with ADLs, IADLs, and mobility (no AD). Pt lives with spouse, who can provide 24/7 supervision at discharge. Pt currently requires minA for bed mobility, CGA for functional transfers, and minA for LB ADLs.  Pt would benefit from skilled OT services to address noted impairments and functional limitations (see below for any additional details) in order to maximize safety and independence while minimizing falls risk and caregiver burden. Anticipate the need for follow up [facility] OT services upon acute hospital DC.        If plan is discharge home, recommend the following: A little help with walking and/or transfers;A little help with bathing/dressing/bathroom;Assist for transportation    Functional Status Assessment  Patient has had a recent decline in their functional status and demonstrates the ability to make significant improvements in function in a reasonable and predictable amount of time.  Equipment Recommendations  Other (comment) (LH sponge, sock aide, reacher)       Precautions / Restrictions Precautions Precautions: Fall Precaution Comments: R JP drain, abdominal incision, foley, s/p colosotmy takedown Restrictions Weight Bearing Restrictions Per Provider Order: No      Mobility Bed Mobility Overal bed mobility: Needs Assistance Bed Mobility: Rolling, Sidelying to Sit Rolling: Contact guard assist Sidelying to sit: Min assist       General bed mobility comments: log roll technique    Transfers Overall  transfer level: Needs assistance Equipment used: Rolling walker (2 wheels) Transfers: Sit to/from Stand, Bed to chair/wheelchair/BSC Sit to Stand: Contact guard assist     Step pivot transfers: Contact guard assist     General transfer comment: steady with transfers, cues for hand placement, movements slow due to abdominal discomfort      Balance Overall balance assessment: Needs assistance Sitting-balance support: Feet supported, No upper extremity supported Sitting balance-Leahy Scale: Good     Standing balance support: During functional activity, Bilateral upper extremity supported Standing balance-Leahy Scale: Poor                             ADL either performed or assessed with clinical judgement   ADL Overall ADL's : Needs assistance/impaired Eating/Feeding: Sitting;Independent   Grooming: Wash/dry hands;Wash/dry face;Oral care;Set up;Sitting;Brushing hair Grooming Details (indicate cue type and reason): sitting EOB, no LOB Upper Body Bathing: Sitting;Set up   Lower Body Bathing: Sitting/lateral leans;Minimal assistance   Upper Body Dressing : Set up;Sitting Upper Body Dressing Details (indicate cue type and reason): dons gown Lower Body Dressing: Minimal assistance;Sit to/from stand Lower Body Dressing Details (indicate cue type and reason): likely minA due to abdominal discomfort Toilet Transfer: Contact guard assist;Rolling walker (2 wheels);Ambulation Toilet Transfer Details (indicate cue type and reason): EOB > recliner, min vcs for hand placement, slow steps due to discomfort but CGA for safety Toileting- Clothing Manipulation and Hygiene: Minimal assistance Toileting - Clothing Manipulation Details (indicate cue type and reason): foley present     Functional mobility during ADLs: Contact guard assist;Rolling walker (2 wheels) General ADL Comments: Pt limited by abdominal discomfort and guarding. Taught log roll technique with good carryover. Sat  EOB for grooming tasks      Pertinent Vitals/Pain Pain Assessment Pain Assessment: Faces Faces Pain Scale: Hurts even more Pain Location: abdomen Pain Descriptors / Indicators: Discomfort, Grimacing, Guarding Pain Intervention(s): Limited activity within patient's tolerance, Monitored during session     Extremity/Trunk Assessment Upper Extremity Assessment Upper Extremity Assessment: Right hand dominant;Overall WFL for tasks assessed (grossly 4+/5 BUE)   Lower Extremity Assessment Lower Extremity Assessment: Defer to PT evaluation       Communication Communication Communication: No apparent difficulties   Cognition Arousal: Alert Behavior During Therapy: WFL for tasks assessed/performed Overall Cognitive Status: Within Functional Limits for tasks assessed                                       General Comments  abdominal dressing intact start/end of session, lines/leads intact start/end of session. Pt slightly SOB after bed mobility, SpO2 on RA 84%, applied 2.5L via [place] and O2 returns to 90%+ after several minutes of breathing techniques. Pt left on RA with SpO2 +90%s.            Home Living Family/patient expects to be discharged to:: Private residence Living Arrangements: Spouse/significant other Available Help at Discharge: Family;Available 24 hours/day Type of Home: House Home Access: Level entry     Home Layout: One level     Bathroom Shower/Tub: Producer, Television/film/video: Handicapped height Bathroom Accessibility: Yes   Home Equipment: Cane - single Librarian, Academic (2 wheels);BSC/3in1          Prior Functioning/Environment Prior Level of Function : Independent/Modified Independent;Driving             Mobility Comments: indepdendent ADLs Comments: mod I with all ADLs, self care tasks, IADLs (limited community navigation) and driving        OT Problem List: Decreased activity tolerance;Impaired balance (sitting  and/or standing);Pain;Decreased knowledge of precautions;Decreased knowledge of use of DME or AE;Cardiopulmonary status limiting activity;Obesity      OT Treatment/Interventions: Self-care/ADL training;Energy conservation;DME and/or AE instruction;Therapeutic activities;Patient/family education;Balance training    OT Goals(Current goals can be found in the care plan section) Acute Rehab OT Goals OT Goal Formulation: With patient Time For Goal Achievement: 06/11/23 Potential to Achieve Goals: Good  OT Frequency: Min 1X/week       AM-PAC OT 6 Clicks Daily Activity     Outcome Measure Help from another person eating meals?: None Help from another person taking care of personal grooming?: None Help from another person toileting, which includes using toliet, bedpan, or urinal?: A Little Help from another person bathing (including washing, rinsing, drying)?: A Little Help from another person to put on and taking off regular upper body clothing?: A Little Help from another person to put on and taking off regular lower body clothing?: A Little 6 Click Score: 20   End of Session Equipment Utilized During Treatment: Rolling walker (2 wheels);Oxygen Nurse Communication: Mobility status  Activity Tolerance: Patient tolerated treatment well Patient left: in chair;with call bell/phone within reach;with chair alarm set  OT Visit Diagnosis: Unsteadiness on feet (R26.81);Pain Pain - part of body:  (abdomen)                Time: 9151-9072 OT Time Calculation (min): 39 min Charges:  OT General Charges $OT Visit: 1 Visit OT Evaluation $OT Eval Low Complexity: 1 Low OT Treatments $Self Care/Home Management : 8-22 mins  Arion Morgan L.  Ashantee Deupree, OTR/L  05/28/23, 10:33 AM

## 2023-05-28 NOTE — Evaluation (Signed)
 Physical Therapy Evaluation Patient Details Name: Whitney Higgins MRN: [id number] DOB: Apr 02, 1956 Today's Date: 05/28/2023  History of Present Illness  Pt is a 68 y.o. female s/p colostomy takedown and JJ ureteral stent placemenet on 05/25/2023. PMH significant for anxiety, depression, OA, asthma, chronic LBP s/p lumbar laminectomy/decompression, gallstones, pituitary tumor, HTN, HDL, diverticulitis, Chohns colitis and IBS.  Clinical Impression  On eval, pt required Min A for mobility. She walked ~ 135 feet with a RW. Moderate pain with activity. Will plan to follow pt during hospital stay. Recommend HHPT f/u.      If plan is discharge home, recommend the following: A little help with walking and/or transfers;A little help with bathing/dressing/bathroom;Assistance with cooking/housework;Assist for transportation;Help with stairs or ramp for entrance   Can travel by private vehicle        Equipment Recommendations None recommended by PT  Recommendations for Other Services       Functional Status Assessment Patient has had a recent decline in their functional status and demonstrates the ability to make significant improvements in function in a reasonable and predictable amount of time.     Precautions / Restrictions Precautions Precautions: Fall Precaution Comments: R JP drain, abdominal incision, foley, s/p colosotmy takedown Restrictions Weight Bearing Restrictions Per Provider Order: No      Mobility  Bed Mobility Overal bed mobility: Needs Assistance Bed Mobility: Rolling, Sidelying to Sit, Sit to Supine Rolling: Supervision Sidelying to sit: Supervision   Sit to supine: Min assist   General bed mobility comments: increased time and effort. heavy reliance on bedrail. Assist for LEs.    Transfers Overall transfer level: Needs assistance Equipment used: Rolling walker (2 wheels) Transfers: Sit to/from Stand Sit to Stand: Contact guard assist, From elevated surface            General transfer comment: Cues for safety, hand placement. Increased time.    Ambulation/Gait Ambulation/Gait assistance: Contact guard assist Gait Distance (Feet): 135 Feet Assistive device: Rolling walker (2 wheels) Gait Pattern/deviations: Step-through pattern, Trunk flexed       General Gait Details: Slow gait speed. Some disomfort and fatigue with ambulation. O2 92% on RA.  Stairs            Wheelchair Mobility     Tilt Bed    Modified Rankin (Stroke Patients Only)       Balance Overall balance assessment: Needs assistance         Standing balance support: During functional activity, Bilateral upper extremity supported Standing balance-Leahy Scale: Poor                               Pertinent Vitals/Pain Pain Assessment Pain Assessment: 0-10 Pain Score: 5  Pain Location: abdomen Pain Descriptors / Indicators: Discomfort, Grimacing, Guarding, Moaning Pain Intervention(s): Limited activity within patient's tolerance, Monitored during session, Repositioned    Home Living Family/patient expects to be discharged to:: Private residence Living Arrangements: Spouse/significant other Available Help at Discharge: Family;Available 24 hours/day Type of Home: House Home Access: Level entry       Home Layout: One level Home Equipment: Cane - single Librarian, Academic (2 wheels);BSC/3in1      Prior Function Prior Level of Function : Independent/Modified Independent;Driving             Mobility Comments: indepdendent ADLs Comments: mod I with all ADLs (increased time) self care tasks, IADLs (limited community navigation) and driving     Extremity/Trunk Assessment  Upper Extremity Assessment Upper Extremity Assessment: Defer to OT evaluation    Lower Extremity Assessment Lower Extremity Assessment: Generalized weakness    Cervical / Trunk Assessment Cervical / Trunk Assessment: Normal  Communication    Communication Communication: No apparent difficulties  Cognition Arousal: Alert Behavior During Therapy: WFL for tasks assessed/performed Overall Cognitive Status: Within Functional Limits for tasks assessed                                          General Comments      Exercises     Assessment/Plan    PT Assessment Patient needs continued PT services  PT Problem List Decreased strength;Decreased range of motion;Decreased activity tolerance;Decreased balance;Decreased mobility;Decreased knowledge of use of DME;Pain       PT Treatment Interventions DME instruction;Gait training;Functional mobility training;Therapeutic exercise;Therapeutic activities;Patient/family education;Balance training    PT Goals (Current goals can be found in the Care Plan section)  Acute Rehab PT Goals Patient Stated Goal: less pain PT Goal Formulation: With patient Time For Goal Achievement: 06/11/23 Potential to Achieve Goals: Good    Frequency Min 1X/week     Co-evaluation               AM-PAC PT 6 Clicks Mobility  Outcome Measure Help needed turning from your back to your side while in a flat bed without using bedrails?: A Little Help needed moving from lying on your back to sitting on the side of a flat bed without using bedrails?: A Little Help needed moving to and from a bed to a chair (including a wheelchair)?: A Little Help needed standing up from a chair using your arms (e.g., wheelchair or bedside chair)?: A Little Help needed to walk in hospital room?: A Little Help needed climbing 3-5 steps with a railing? : A Little 6 Click Score: 18    End of Session   Activity Tolerance: Patient tolerated treatment well Patient left: in bed;with call bell/phone within reach   PT Visit Diagnosis: Difficulty in walking, not elsewhere classified (R26.2);Pain    Time: 8766-8698 PT Time Calculation (min) (ACUTE ONLY): 28 min   Charges:   PT Evaluation $PT Eval Low  Complexity: 1 Low PT Treatments $Gait Training: 8-22 mins PT General Charges $$ ACUTE PT VISIT: 1 Visit          Dannial SQUIBB, PT Acute Rehabilitation  Office: [phone]

## 2023-05-28 NOTE — Progress Notes (Signed)
 3 Days Post-Op   Subjective/Chief Complaint: Alert and oriented. Husband at bedside. Passing flatus. No BM yet. No n/v with liquid diet. Pain controlled. No confusion today.   Objective: Vital signs in last 24 hours: Temp:  [98.2 F (36.8 C)-99 F (37.2 C)] 98.3 F (36.8 C) (01/12 2109) Pulse Rate:  [75-82] 75 (01/12 2109) Resp:  [16-18] 16 (01/12 2109) BP: (115-142)/(59-78) 142/62 (01/12 2109) SpO2:  [91 %-95 %] 95 % (01/12 2109) Last BM Date : 05/25/23  Intake/Output from previous day: 01/12 0701 - 01/13 0700 In: 1620 [P.O.:620; I.V.:1000] Out: 930 [Urine:850; Drains:80] Intake/Output this shift: No intake/output data recorded.  General nad Cv regular Pulm effort normal Ab soft approp tender drain serosang all dressings dry; JP thin serosanguinous. No erythema; staples in place. Foley in place  Lab Results:  Recent Labs    05/27/23 0521 05/28/23 0434  WBC 13.8* 9.2  HGB 10.2* 9.2*  HCT 31.2* 28.1*  PLT 156 141*   BMET Recent Labs    05/27/23 0521 05/28/23 0434  NA 133* 132*  K 3.8 3.6  CL 99 96*  CO2 29 30  GLUCOSE 107* 93  BUN 14 16  CREATININE 0.72 0.98  CALCIUM  8.5* 8.4*   PT/INR No results for input(s): LABPROT, INR in the last 72 hours. ABG No results for input(s): PHART, HCO3 in the last 72 hours.  Invalid input(s): PCO2, PO2  Studies/Results: No results found.  Anti-infectives: Anti-infectives (From admission, onward)    Start     Dose/Rate Route Frequency Ordered Stop   05/25/23 1400  neomycin  (MYCIFRADIN ) tablet 1,000 mg  Status:  Discontinued       Placed in And Linked Group   1,000 mg Oral 3 times per day 05/25/23 0528 05/25/23 0529   05/25/23 1400  metroNIDAZOLE  (FLAGYL ) tablet 1,000 mg  Status:  Discontinued       Placed in And Linked Group   1,000 mg Oral 3 times per day 05/25/23 0528 05/25/23 0529   05/25/23 0600  cefoTEtan  (CEFOTAN ) 2 g in sodium chloride  0.9 % 100 mL IVPB        2 g 200 mL/hr over 30  Minutes Intravenous On call to O.R. 05/25/23 0528 05/25/23 0803       Assessment/Plan: POD 3 open colostomy takedown, sbr, left ureteral repair over stent -Adv to solids today; monitor; having flatus, no bm yet -MIVF until reliably keeping up with needs PO -pain control -oob, pulm toilet -foley to remain until cleared for removal by urology -PPX: SQH, SCDs -PT/OT to begin working with her today   Lonni CHRISTELLA [employer] 05/28/2023

## 2023-05-29 LAB — CBC WITH DIFFERENTIAL/PLATELET
Abs Immature Granulocytes: 0.08 10*3/uL — ABNORMAL HIGH (ref 0.00–0.07)
Basophils Absolute: 0 10*3/uL (ref 0.0–0.1)
Basophils Relative: 0 %
Eosinophils Absolute: 0.3 10*3/uL (ref 0.0–0.5)
Eosinophils Relative: 4 %
HCT: 30 % — ABNORMAL LOW (ref 36.0–46.0)
Hemoglobin: 9.9 g/dL — ABNORMAL LOW (ref 12.0–15.0)
Immature Granulocytes: 1 %
Lymphocytes Relative: 9 %
Lymphs Abs: 0.8 10*3/uL (ref 0.7–4.0)
MCH: 29.1 pg (ref 26.0–34.0)
MCHC: 33 g/dL (ref 30.0–36.0)
MCV: 88.2 fL (ref 80.0–100.0)
Monocytes Absolute: 0.8 10*3/uL (ref 0.1–1.0)
Monocytes Relative: 9 %
Neutro Abs: 6.8 10*3/uL (ref 1.7–7.7)
Neutrophils Relative %: 77 %
Platelets: 188 10*3/uL (ref 150–400)
RBC: 3.4 MIL/uL — ABNORMAL LOW (ref 3.87–5.11)
RDW: 14.1 % (ref 11.5–15.5)
WBC: 8.9 10*3/uL (ref 4.0–10.5)
nRBC: 0 % (ref 0.0–0.2)

## 2023-05-29 NOTE — Plan of Care (Signed)
  Problem: Safety: Goal: Ability to remain free from injury will improve Outcome: Progressing   Problem: Pain Management: Goal: General experience of comfort will improve Outcome: Progressing   Problem: Nutritional: Goal: Will attain and maintain optimal nutritional status will improve Outcome: Progressing

## 2023-05-29 NOTE — Progress Notes (Signed)
   4 Days Post-Op Subjective: Pt is good spirits again today  Objective: Vital signs in last 24 hours: Temp:  [97.7 F (36.5 C)-98.7 F (37.1 C)] 98.6 F (37 C) (01/14 0809) Pulse Rate:  [78-89] 78 (01/14 0809) Resp:  [18-20] 20 (01/14 0809) BP: (128-135)/(61-77) 131/68 (01/14 0902) SpO2:  [87 %-93 %] 90 % (01/14 0809) Weight:  [118.5 kg] 118.5 kg (01/14 0505)  Assessment/Plan: #ureteral repair S/p left ureteral stent with open ureteral repair Stent to stay in place for 6 weeks Foley catheter was not removed yesterday. I removed this am.  Preserved renal function. 4.85L UOP Dr. Elisabeth arranging outpt follow up for stent removal Oxybutynin 5mg  PO TID PRN for stent colic, urinary urgency, urinary frequency, bladder spasm.  Will see as needed.   Intake/Output from previous day: 01/13 0701 - 01/14 0700 In: 1680 [P.O.:1680] Out: 5031 [Urine:4850; Drains:180; Stool:1]  Intake/Output this shift: Total I/O In: 0  Out: 370 [Urine:350; Drains:20]  Physical Exam:  General: Alert and oriented CV: No cyanosis Lungs: equal chest rise Abdomen: Soft, NTND, no rebound or guarding Gu: foley catheter in place draining clear yellow urine  Lab Results: Recent Labs    05/27/23 0521 05/28/23 0434 05/29/23 0713  HGB 10.2* 9.2* 9.9*  HCT 31.2* 28.1* 30.0*   BMET Recent Labs    05/27/23 0521 05/28/23 0434  NA 133* 132*  K 3.8 3.6  CL 99 96*  CO2 29 30  GLUCOSE 107* 93  BUN 14 16  CREATININE 0.72 0.98  CALCIUM  8.5* 8.4*     Studies/Results: No results found.    LOS: 4 days   Ole Bourdon, NP [facility] Pager: [phone]  05/29/2023, 9:53 AM

## 2023-05-29 NOTE — Progress Notes (Signed)
 4 Days Post-Op   Subjective/Chief Complaint: Alert and oriented. Husband at bedside. Passing flatus and had BM yesterday. No n/v with liquid diet. Pain controlled. No confusion today.   Objective: Vital signs in last 24 hours: Temp:  [97.7 F (36.5 C)-98.7 F (37.1 C)] 97.7 F (36.5 C) (01/14 0505) Pulse Rate:  [85-89] 85 (01/14 0505) Resp:  [18] 18 (01/14 0505) BP: (128-135)/(61-77) 130/77 (01/14 0505) SpO2:  [87 %-93 %] 92 % (01/14 0505) Weight:  [118.5 kg] 118.5 kg (01/14 0505) Last BM Date : 05/28/23  Intake/Output from previous day: 01/13 0701 - 01/14 0700 In: 1680 [P.O.:1680] Out: 5031 [Urine:4850; Drains:180; Stool:1] Intake/Output this shift: No intake/output data recorded.  General nad Cv regular Pulm effort normal Ab soft approp tender drain serosang all dressings dry; JP thin serosanguinous. No erythema; staples in place. Foley in place  Lab Results:  Recent Labs    05/27/23 0521 05/28/23 0434  WBC 13.8* 9.2  HGB 10.2* 9.2*  HCT 31.2* 28.1*  PLT 156 141*   BMET Recent Labs    05/27/23 0521 05/28/23 0434  NA 133* 132*  K 3.8 3.6  CL 99 96*  CO2 29 30  GLUCOSE 107* 93  BUN 14 16  CREATININE 0.72 0.98  CALCIUM  8.5* 8.4*   PT/INR No results for input(s): LABPROT, INR in the last 72 hours. ABG No results for input(s): PHART, HCO3 in the last 72 hours.  Invalid input(s): PCO2, PO2  Studies/Results: No results found.  Anti-infectives: Anti-infectives (From admission, onward)    Start     Dose/Rate Route Frequency Ordered Stop   05/25/23 1400  neomycin  (MYCIFRADIN ) tablet 1,000 mg  Status:  Discontinued       Placed in And Linked Group   1,000 mg Oral 3 times per day 05/25/23 0528 05/25/23 0529   05/25/23 1400  metroNIDAZOLE  (FLAGYL ) tablet 1,000 mg  Status:  Discontinued       Placed in And Linked Group   1,000 mg Oral 3 times per day 05/25/23 0528 05/25/23 0529   05/25/23 0600  cefoTEtan  (CEFOTAN ) 2 g in sodium chloride   0.9 % 100 mL IVPB        2 g 200 mL/hr over 30 Minutes Intravenous On call to O.R. 05/25/23 0528 05/25/23 0803       Assessment/Plan: POD 4 open colostomy takedown, sbr, left ureteral repair over stent -Heart healthy diet -MIVF until reliably keeping up with needs PO -pain control -oob, pulm toilet -urology ok with foley coming out - does not appear it was removed yesterday, so planning today -PPX: SQH, SCDs -PT/OT   Lonni HERO [facility] 05/29/2023

## 2023-05-30 LAB — BASIC METABOLIC PANEL
Anion gap: 11 (ref 5–15)
BUN: 13 mg/dL (ref 8–23)
CO2: 26 mmol/L (ref 22–32)
Calcium: 8.5 mg/dL — ABNORMAL LOW (ref 8.9–10.3)
Chloride: 100 mmol/L (ref 98–111)
Creatinine, Ser: 0.89 mg/dL (ref 0.44–1.00)
GFR, Estimated: >60 mL/min (ref >60)
Glucose, Bld: 98 mg/dL (ref 70–99)
Potassium: 3.7 mmol/L (ref 3.5–5.1)
Sodium: 137 mmol/L (ref 135–145)

## 2023-05-30 LAB — CREATININE, FLUID (PLEURAL, PERITONEAL, JP DRAINAGE): Creat, Fluid: 0.8 mg/dL

## 2023-05-30 MED ORDER — OXYCODONE HCL 5 MG PO TABS: 5.0000 mg | ORAL_TABLET | Freq: Four times a day (QID) | ORAL | 0 refills | Status: AC | PRN

## 2023-05-30 NOTE — Progress Notes (Signed)
 Occupational Therapy Treatment Patient Details Name: Whitney Higgins MRN: [id number] DOB: [birth year] Today's Date: 05/30/2023   History of present illness Pt is a 68 y.o. female s/p colostomy takedown and JJ ureteral stent placemenet on 05/25/2023. PMH significant for anxiety, depression, OA, asthma, chronic LBP s/p lumbar laminectomy/decompression, gallstones, pituitary tumor, HTN, HDL, diverticulitis, Chohns colitis and IBS.   OT comments  Pt is progressing well towards OT goals this session. Focused on compensatory strategies for LB ADL and edcuation on choices that can make transfers and ADL easier (clothing choices,  set up etc). Husband present throughout. Pt sat EOB but declined mobility at this time. Pt with dc orders and planning to go home today. POC remains appropriate for HHOT.       If plan is discharge home, recommend the following:  A little help with walking and/or transfers;A little help with bathing/dressing/bathroom;Assist for transportation   Equipment Recommendations  Other (comment) (LH sponge, sock aide, reacher)    Recommendations for Other Services      Precautions / Restrictions Precautions Precautions: Fall Precaution Comments: R JP drain, abdominal incision, foley, s/p colosotmy takedown Restrictions Weight Bearing Restrictions Per Provider Order: No       Mobility Bed Mobility Overal bed mobility: Needs Assistance Bed Mobility: Rolling, Sidelying to Sit, Sit to Supine Rolling: Supervision Sidelying to sit: Supervision   Sit to supine: Supervision   General bed mobility comments: increased time and effort. heavy reliance on bedrail.    Transfers                   General transfer comment: Declined Transfers/OOB this session - due to transport home today     Balance Overall balance assessment: Needs assistance Sitting-balance support: Feet supported, No upper extremity supported Sitting balance-Leahy Scale: Good                                      ADL either performed or assessed with clinical judgement   ADL Overall ADL's : Needs assistance/impaired     Grooming: Wash/dry hands;Wash/dry face;Oral care;Set up;Sitting;Brushing hair Grooming Details (indicate cue type and reason): sitting EOB, no LOB     Lower Body Bathing: Sitting/lateral leans;Minimal assistance Lower Body Bathing Details (indicate cue type and reason): educated on smart clothing options (nightgown with no underwear if comfortable) to minimize need to bend for accessing LB - AE                     Functional mobility during ADLs: Contact guard assist;Rolling walker (2 wheels) General ADL Comments: edcuated on compensatory strategies for ADL    Extremity/Trunk Assessment Upper Extremity Assessment Upper Extremity Assessment: Overall WFL for tasks assessed   Lower Extremity Assessment Lower Extremity Assessment: Defer to PT evaluation        Vision   Vision Assessment?: No apparent visual deficits   Perception     Praxis      Cognition Arousal: Alert Behavior During Therapy: WFL for tasks assessed/performed Overall Cognitive Status: Within Functional Limits for tasks assessed                                          Exercises      Shoulder Instructions       General Comments      Pertinent  Vitals/ Pain       Pain Assessment Pain Assessment: Faces Faces Pain Scale: Hurts little more Pain Location: abdomen Pain Descriptors / Indicators: Discomfort, Grimacing, Guarding, Moaning  Home Living                                          Prior Functioning/Environment              Frequency  Min 1X/week        Progress Toward Goals  OT Goals(current goals can now be found in the care plan section)  Progress towards OT goals: Progressing toward goals  Acute Rehab OT Goals OT Goal Formulation: With patient Time For Goal Achievement: 06/11/23 Potential to  Achieve Goals: Good ADL Goals Pt Will Perform Grooming: with modified independence;standing Pt Will Perform Lower Body Dressing: with modified independence;with adaptive equipment;sit to/from stand;sitting/lateral leans Pt Will Transfer to Toilet: with modified independence;ambulating;bedside commode Pt Will Perform Toileting - Clothing Manipulation and hygiene: with modified independence;sitting/lateral leans;with adaptive equipment  Plan      Co-evaluation                 AM-PAC OT "6 Clicks" Daily Activity     Outcome Measure   Help from another person eating meals?: None Help from another person taking care of personal grooming?: None Help from another person toileting, which includes using toliet, bedpan, or urinal?: A Little Help from another person bathing (including washing, rinsing, drying)?: A Little Help from another person to put on and taking off regular upper body clothing?: A Little Help from another person to put on and taking off regular lower body clothing?: A Little 6 Click Score: 20    End of Session    OT Visit Diagnosis: Unsteadiness on feet (R26.81);Pain Pain - part of body:  (abdomen)   Activity Tolerance Patient tolerated treatment well   Patient Left with call bell/phone within reach;in bed;with bed alarm set;with family/visitor present   Nurse Communication Mobility status        Time: 1610-9604 OT Time Calculation (min): 26 min  Charges: OT General Charges $OT Visit: 1 Visit OT Treatments $Self Care/Home Management : 23-37 mins  Chales [place] OTR/L Acute Rehabilitation Services Office: [phone]  Ebony Goldstein [facility] 05/30/2023, 10:37 AM

## 2023-05-30 NOTE — Discharge Summary (Signed)
 Patient ID: Whitney Higgins MRN: [id number] DOB/AGE: 1956/03/15 68 y.o.  Admit date: 05/25/2023 Discharge date: 05/30/2023  Discharge Diagnoses Patient Active Problem List   Diagnosis Date Noted   S/P colostomy takedown 05/25/2023   Irritant contact dermatitis associated with fecal stoma 03/23/2023   Colostomy care (HCC) 03/23/2023   Nausea & vomiting 09/06/2022   Hypokalemia 09/05/2022   Abdominal pain, left lower quadrant 08/20/2022   Transaminitis 08/17/2022   Abnormal alkaline phosphatase test 04/07/2019   Hx of colonic polyps 01/10/2019   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   Obesity, Class III, BMI 40-49.9 (morbid obesity) (HCC) 01/10/2019   Crohn's colitis (HCC) - left-sided 01/06/2019   Severe persistent asthma 06/14/2017   Perennial allergic rhinitis with a predominant nonallergic component 06/14/2017   Recurrent sinus infections 06/14/2017   Closed nondisplaced fracture of lateral malleolus of right fibula 09/05/2016   Dizzy spells 07/10/2016   Coronary artery calcification 06/27/2016   Idiopathic progressive neuropathy 09/09/2015   Vitamin D  deficiency 09/09/2015   Chronic anxiety 05/26/2015   Hallux valgus 05/26/2015   Lumbar radiculopathy 05/26/2015   Osteoarthritis 05/26/2015   Recurrent major depressive disorder, in full remission (HCC) 05/26/2015   Severe asthma with acute exacerbation 05/26/2015   Spondylosis without myelopathy or radiculopathy, lumbar region 05/26/2015   Arthritis of right acromioclavicular joint 05/14/2015   Complete tear of right rotator cuff 05/14/2015   Sprain and strain of other specified sites of shoulder and upper arm 05/14/2015   Abnormal EKG 01/21/2015   Menopausal and female climacteric states 12/21/2014   Primary osteoarthritis of right shoulder 11/13/2014   Acquired hypothyroidism 09/08/2014   Asthma 09/08/2014   Degeneration of lumbar intervertebral disc 09/08/2014   Environmental allergies 09/08/2014   Essential  hypertension 09/08/2014   Hypercholesterolemia 09/08/2014   Major depressive disorder 09/08/2014   Migraine, unspecified, not intractable, without status migrainosus 09/08/2014   Orthostatic hypotension 09/08/2014   Prolactinoma (HCC) 09/08/2014   Impingement syndrome of right shoulder 04/20/2014    Consultants Intraoperative consultation with urology  Procedures Laparoscopic converted to open colostomy takedown Lysis of adhesions x 120 minutes Small bowel resection Small bowel repair, serosa Diagnostic flexible sigmoidoscopy (necessary to confirm anatomy) Intraoperative assessment of perfusion using ICG fluorescence imaging Bilateral transversus abdominus plane (TAP) blocks   (Cystoscopy/ureteral firefly/ICG - Dr. Derrick Fling; left ureteral repair - Dr. Valeta Gaudier)   Hospital Course: She was taken to the operating room 05/25/2023 and underwent the above procedures.  She was admitted postoperatively and did quite well.  Her diet was gradually advanced which she tolerated without difficulty.  She began having spontaneous bowel function.  Under the direction of urology, her Foley catheter was removed uneventfully.  She was voiding on her own.  On 05/30/2023, her pain is well-controlled she is mobilizing well in her room, tolerating a diet, and having regular bowel function.  A drain creatinine was sent and normal.  This was reviewed with Dr. Valeta Gaudier.  We opted to go ahead and remove her JP drain prior to discharge.  Postoperative expectations were reviewed.  All of her and her husband's questions were answered to satisfaction, they expressed understanding and wished to proceed home.  Postoperative follow-up with both myself and Dr. Valeta Gaudier have been arranged.  They have been encouraged to call with any questions or concerns.    Allergies as of 05/30/2023       Reactions   Other Nausea And Vomiting, Other (See Comments)   PAIN MEDS AND A LOT OF ANTIBIOTICS CAN ONLY BE  TOLERATED VIA IV- NOT BY  MOUTH ANESTHESIA- "I GET DEATHLY ILL"   Codeine Nausea And Vomiting   Clarithromycin Rash   Dicyclomine  Hcl    Dizzy, and headache   Penicillins Rash        Medication List     STOP taking these medications    traMADol  50 MG tablet Commonly known as: ULTRAM        TAKE these medications    acetaminophen  500 MG tablet Commonly known as: TYLENOL  Take 1,000 mg by mouth every 6 (six) hours as needed for pain.   albuterol  108 (90 Base) MCG/ACT inhaler Commonly known as: VENTOLIN  HFA Inhale 2 puffs into the lungs every 6 (six) hours as needed (Asthma).   amLODipine  5 MG tablet Commonly known as: NORVASC  Take 5 mg by mouth daily.   atorvastatin  20 MG tablet Commonly known as: LIPITOR Take 20 mg by mouth at bedtime.   B12 LIQUID HEALTH BOOSTER PO Place 5,000 mcg under the tongue daily.   fexofenadine 180 MG tablet Commonly known as: ALLEGRA Take 180 mg by mouth daily.   hyoscyamine  0.125 MG SL tablet Commonly known as: LEVSIN SL Place 1 tablet (0.125 mg total) under the tongue every 4 (four) hours as needed.   ipratropium 0.06 % nasal spray Commonly known as: ATROVENT  Place 2 sprays into the nose 2 (two) times daily as needed for rhinitis.   levothyroxine  75 MCG tablet Commonly known as: SYNTHROID  Take 75 mcg by mouth daily before breakfast.   liothyronine  5 MCG tablet Commonly known as: CYTOMEL  Take 5 mcg by mouth 2 (two) times daily.   lisinopril -hydrochlorothiazide  20-25 MG tablet Commonly known as: ZESTORETIC  Take 1 tablet by mouth daily.   methocarbamol  500 MG tablet Commonly known as: ROBAXIN  Take 1 tablet (500 mg total) by mouth every 6 (six) hours as needed for muscle spasms.   montelukast  10 MG tablet Commonly known as: SINGULAIR  Take 10 mg by mouth at bedtime.   naproxen 500 MG tablet Commonly known as: NAPROSYN Take 500 mg by mouth 2 (two) times daily as needed for moderate pain (pain score 4-6).   oxyCODONE  5 MG immediate release  tablet Commonly known as: Roxicodone  Take 1 tablet (5 mg total) by mouth every 6 (six) hours as needed for up to 5 days (postop pain not controlled with tylenol /ibuprofen  first).   potassium chloride  10 MEQ tablet Commonly known as: KLOR-CON  Take 10 mEq by mouth daily before breakfast.   sodium chloride  0.65 % Soln nasal spray Commonly known as: OCEAN Place 1 spray into both nostrils daily as needed for congestion.   venlafaxine  XR 150 MG 24 hr capsule Commonly known as: EFFEXOR -XR Take 150 mg by mouth 2 (two) times daily.   Vitamin D3 125 MCG (5000 UT) Tabs Take 5,000 Units by mouth daily.          Follow-up Information     Melvenia Stabs, MD Follow up on 06/12/2023.   Specialties: General Surgery, Colon and Rectal Surgery Why: We will also remove your staples at this visit, all at one visit Contact information: [address] [place] [address] [phone]         Roxane Copp, MD. Call.   Specialty: Urology Why: To schedule follow-up appointment for ureteral stent management Contact information: [address] [place] [address] [phone]                 Renford Cartwright. Camilo Cella, M.D. [facility]

## 2023-05-30 NOTE — Progress Notes (Signed)
 AVS reviewed w/ patient & spouse- both verbalized an understanding. Pt & husband requested ondansetron  prescription for home- per pt she has had intermittent nausea  during her stay. PIV removed as noted. Pt dressed for d/c to home- JP removed earlier by Amalia Badder - primary RN

## 2023-05-30 NOTE — Plan of Care (Signed)
  Problem: Education: Goal: Understanding of discharge needs will improve Outcome: Progressing Goal: Verbalization of understanding of the causes of altered bowel function will improve Outcome: Progressing   Problem: Nutritional: Goal: Will attain and maintain optimal nutritional status will improve Outcome: Progressing

## 2023-05-30 NOTE — Progress Notes (Signed)
 Call to Dr white's office for zofran  prescription per pt request. Pharmacy on record confirmed. This RN spoke w/ triage nurse in office who will contact Dr Camilo Cella and have medication sent in. Pt & husband updated by this RN.

## 2023-05-30 NOTE — Progress Notes (Signed)
 5 Days Post-Op   Subjective/Chief Complaint: Doing great. Up in chair in her home Pjs. No n/v. Having flatus and Bms. Pain controlled. Voiding well on her own   Objective: Vital signs in last 24 hours: Temp:  [97.3 F (36.3 C)-98 F (36.7 C)] 97.9 F (36.6 C) (01/15 0610) Pulse Rate:  [74-90] 74 (01/15 0610) Resp:  [18] 18 (01/15 0610) BP: (126-147)/(60-76) 147/76 (01/15 0610) SpO2:  [92 %-93 %] 92 % (01/15 0610) Last BM Date : 05/30/23  Intake/Output from previous day: 01/14 0701 - 01/15 0700 In: 1320 [P.O.:1320] Out: 1085 [Urine:975; Drains:110] Intake/Output this shift: Total I/O In: -  Out: 15 [Drains:15]  General nad Cv regular Pulm effort normal Ab soft approp tender drain serosang all dressings dry; JP thin serosanguinous. No erythema; staples in place. Foley in place  Lab Results:  Recent Labs    05/28/23 0434 05/29/23 0713  WBC 9.2 8.9  HGB 9.2* 9.9*  HCT 28.1* 30.0*  PLT 141* 188   BMET Recent Labs    05/28/23 0434  NA 132*  K 3.6  CL 96*  CO2 30  GLUCOSE 93  BUN 16  CREATININE 0.98  CALCIUM  8.4*   PT/INR No results for input(s): "LABPROT", "INR" in the last 72 hours. ABG No results for input(s): "PHART", "HCO3" in the last 72 hours.  Invalid input(s): "PCO2", "PO2"  Studies/Results: No results found.  Anti-infectives: Anti-infectives (From admission, onward)    Start     Dose/Rate Route Frequency Ordered Stop   05/25/23 1400  neomycin  (MYCIFRADIN ) tablet 1,000 mg  Status:  Discontinued       Placed in "And" Linked Group   1,000 mg Oral 3 times per day 05/25/23 0528 05/25/23 0529   05/25/23 1400  metroNIDAZOLE  (FLAGYL ) tablet 1,000 mg  Status:  Discontinued       Placed in "And" Linked Group   1,000 mg Oral 3 times per day 05/25/23 0528 05/25/23 0529   05/25/23 0600  cefoTEtan  (CEFOTAN ) 2 g in sodium chloride  0.9 % 100 mL IVPB        2 g 200 mL/hr over 30 Minutes Intravenous On call to O.R. 05/25/23 0528 05/25/23 0803        Assessment/Plan: POD 5 open colostomy takedown, sbr, left ureteral repair over stent -Heart healthy diet -D/C IVF -pain control -oob, pulm toilet -JP thin serosang, JP creatinine 0.8; reviewed with Dr. Valeta Gaudier; we will plan JP removal  -PPX: SQH, SCDs -She is comfortable with and stable for discharge home. Expectations reviewed with her and her husband; all questions were answered. Will plan for staple removal at her follow-up visit with me. Will also be following up with Dr. Valeta Gaudier for mgmt of ureteral stent as outpatient  Whitney Higgins [facility] 05/30/2023

## 2023-05-30 NOTE — Discharge Instructions (Signed)
 POST OP INSTRUCTIONS AFTER COLON SURGERY  DIET: Be sure to include lots of fluids daily to stay hydrated - 64oz of water  per day (8, 8 oz glasses).  Avoid fast food or heavy meals for the first couple of weeks as your are more likely to get nauseated. Avoid raw/uncooked fruits or vegetables for the first 4 weeks (its ok to have these if they are blended into smoothie form). If you have fruits/vegetables, make sure they are cooked until soft enough to mash on the roof of your mouth and chew your food well. Otherwise, diet as tolerated.  Take your usually prescribed home medications unless otherwise directed.  PAIN CONTROL: Pain is best controlled by a usual combination of three different methods TOGETHER: Ice/Heat Over the counter pain medication Prescription pain medication Most patients will experience some swelling and bruising around the surgical site.  Ice packs or heating pads (30-60 minutes up to 6 times a day) will help. Some people prefer to use ice alone, heat alone, alternating between ice & heat.  Experiment to what works for you.  Swelling and bruising can take several weeks to resolve.   It is helpful to take an over-the-counter pain medication regularly for the first few weeks: Ibuprofen  (Motrin /Advil ) - 200mg  tabs - take 3 tabs (600mg ) every 6 hours as needed for pain (unless you have been directed previously to avoid NSAIDs/ibuprofen ) Acetaminophen  (Tylenol ) - you may take 650mg  every 6 hours as needed. You can take this with motrin  as they act differently on the body. If you are taking a narcotic pain medication that has acetaminophen  in it, do not take over the counter tylenol  at the same time. NOTE: You may take both of these medications together - most patients  find it most helpful when alternating between the two (i.e. Ibuprofen  at 6am, tylenol  at 9am, ibuprofen  at 12pm ..Aaron Aas) A  prescription for pain medication should be given to you upon discharge.  Take your pain medication as  prescribed if your pain is not adequatly controlled with the over-the-counter pain reliefs mentioned above.  Avoid getting constipated.  Between the surgery and the pain medications, it is common to experience some constipation.  Increasing fluid intake and taking a fiber supplement (such as Metamucil, Citrucel, FiberCon, MiraLax , etc) 1-2 times a day regularly will usually help prevent this problem from occurring.  A mild laxative (prune juice, Milk of Magnesia, MiraLax , etc) should be taken according to package directions if there are no bowel movements after 48 hours.    Dressing: Staples are in place and will be removed at your scheduled follow-up visit with me since it is 06/12/23. Continue to monitor JP drain - empty/record twice daily as instructed. Keep a record of output volume in mL and color. Bring this with you to your follow-up visits. Cover midline wound with gauze and change daily; same for your former ostomy site.. Avoid baths/pools/lakes/oceans until your wounds have fully healed.  ACTIVITIES as tolerated:   Avoid heavy lifting (>10lbs or 1 gallon of milk) for the next 6 weeks. You may resume regular daily activities as tolerated--such as daily self-care, walking, climbing stairs--gradually increasing activities as tolerated.  If you can walk 30 minutes without difficulty, it is safe to try more intense activity such as jogging, treadmill, bicycling, low-impact aerobics.  DO NOT PUSH THROUGH PAIN.  Let pain be your guide: If it hurts to do something, don't do it. You may drive when you are no longer taking prescription pain medication, you  can comfortably wear a seatbelt, and you can safely maneuver your car and apply brakes.  FOLLOW UP in our office Please call [facility] at [phone] to set up an appointment to see your surgeon in the office for a follow-up appointment approximately 2 weeks after your surgery. Make sure that you call for this appointment the day you arrive home to  insure a convenient appointment time.  9. If you have disability or family leave forms that need to be completed, you may have them completed by your primary care physician's office; for return to work instructions, please ask our office staff and they will be happy to assist you in obtaining this documentation   When to call us  [phone]: Poor pain control Reactions / problems with new medications (rash/itching, etc)  Fever over 101.5 F (38.5 C) Inability to urinate Nausea/vomiting Worsening swelling or bruising Continued bleeding from incision. Increased pain, redness, or drainage from the incision  The clinic staff is available to answer your questions during regular business hours (8:30am-5pm).  Please don't hesitate to call and ask to speak to one of our nurses for clinical concerns.   A surgeon from [facility] is always on call at the hospitals   If you have a medical emergency, go to the nearest emergency room or call 911.  [facility] [address], [address], [place], [place]  [address] MAIN: [phone] FAX: [phone] [email or web address]

## 2023-08-13 ENCOUNTER — Other Ambulatory Visit (HOSPITAL_COMMUNITY): Payer: Self-pay | Admitting: Orthopedic Surgery

## 2023-08-13 DIAGNOSIS — M79604 Pain in right leg: Secondary | ICD-10-CM

## 2023-08-14 ENCOUNTER — Encounter (HOSPITAL_COMMUNITY): Payer: Self-pay

## 2023-08-14 ENCOUNTER — Ambulatory Visit (HOSPITAL_COMMUNITY): Admission: RE | Admit: 2023-08-14 | Source: Ambulatory Visit
